# Patient Record
Sex: Male | Born: 1955
Health system: Southern US, Community
[De-identification: ages and names within clinical notes are randomized; demographics above are authoritative.]

## PROBLEM LIST (undated history)

## (undated) DIAGNOSIS — C61 Malignant neoplasm of prostate: Secondary | ICD-10-CM

## (undated) DIAGNOSIS — F1911 Other psychoactive substance abuse, in remission: Secondary | ICD-10-CM

## (undated) DIAGNOSIS — C19 Malignant neoplasm of rectosigmoid junction: Secondary | ICD-10-CM

## (undated) DIAGNOSIS — M1990 Unspecified osteoarthritis, unspecified site: Secondary | ICD-10-CM

## (undated) DIAGNOSIS — K759 Inflammatory liver disease, unspecified: Secondary | ICD-10-CM

## (undated) DIAGNOSIS — K219 Gastro-esophageal reflux disease without esophagitis: Secondary | ICD-10-CM

## (undated) DIAGNOSIS — R569 Unspecified convulsions: Secondary | ICD-10-CM

## (undated) DIAGNOSIS — F1010 Alcohol abuse, uncomplicated: Secondary | ICD-10-CM

## (undated) DIAGNOSIS — G629 Polyneuropathy, unspecified: Secondary | ICD-10-CM

## (undated) DIAGNOSIS — Z720 Tobacco use: Secondary | ICD-10-CM

## (undated) DIAGNOSIS — M25519 Pain in unspecified shoulder: Secondary | ICD-10-CM

## (undated) HISTORY — DX: Unspecified osteoarthritis, unspecified site: M19.90

## (undated) HISTORY — DX: Other psychoactive substance abuse, in remission: F19.11

## (undated) HISTORY — PX: COLON SURGERY: SHX602

## (undated) HISTORY — DX: Gastro-esophageal reflux disease without esophagitis: K21.9

## (undated) HISTORY — PX: PROSTATE BIOPSY: SHX241

## (undated) HISTORY — DX: Malignant neoplasm of rectosigmoid junction: C19

## (undated) HISTORY — PX: OTHER SURGICAL HISTORY: SHX169

## (undated) HISTORY — DX: Pain in unspecified shoulder: M25.519

---

## 2005-12-14 ENCOUNTER — Emergency Department: Payer: Self-pay | Admitting: General Practice

## 2009-03-12 ENCOUNTER — Encounter (INDEPENDENT_AMBULATORY_CARE_PROVIDER_SITE_OTHER): Payer: Self-pay | Admitting: *Deleted

## 2009-04-22 ENCOUNTER — Ambulatory Visit: Payer: Self-pay | Admitting: Gastroenterology

## 2009-04-22 DIAGNOSIS — B1710 Acute hepatitis C without hepatic coma: Secondary | ICD-10-CM | POA: Insufficient documentation

## 2009-04-23 LAB — CONVERTED CEMR LAB
INR: 1.1 (ref 0.0–1.5)
Prothrombin Time: 14.4 s (ref 11.6–15.2)

## 2009-04-26 DIAGNOSIS — K279 Peptic ulcer, site unspecified, unspecified as acute or chronic, without hemorrhage or perforation: Secondary | ICD-10-CM | POA: Insufficient documentation

## 2009-04-26 DIAGNOSIS — F1010 Alcohol abuse, uncomplicated: Secondary | ICD-10-CM | POA: Insufficient documentation

## 2009-04-27 LAB — CONVERTED CEMR LAB: HCV Quantitative: 7760000 intl units/mL — ABNORMAL HIGH (ref <43)

## 2009-04-28 ENCOUNTER — Ambulatory Visit (HOSPITAL_COMMUNITY): Admission: RE | Admit: 2009-04-28 | Discharge: 2009-04-28 | Payer: Self-pay | Admitting: Gastroenterology

## 2009-05-04 ENCOUNTER — Ambulatory Visit: Payer: Self-pay | Admitting: Gastroenterology

## 2009-05-04 ENCOUNTER — Encounter: Payer: Self-pay | Admitting: Gastroenterology

## 2009-05-04 ENCOUNTER — Ambulatory Visit (HOSPITAL_COMMUNITY): Admission: RE | Admit: 2009-05-04 | Discharge: 2009-05-04 | Payer: Self-pay | Admitting: Gastroenterology

## 2009-09-07 ENCOUNTER — Encounter (INDEPENDENT_AMBULATORY_CARE_PROVIDER_SITE_OTHER): Payer: Self-pay | Admitting: *Deleted

## 2009-10-07 ENCOUNTER — Ambulatory Visit: Payer: Self-pay | Admitting: Gastroenterology

## 2010-09-20 ENCOUNTER — Encounter (INDEPENDENT_AMBULATORY_CARE_PROVIDER_SITE_OTHER): Payer: Self-pay | Admitting: *Deleted

## 2010-10-11 NOTE — Assessment & Plan Note (Signed)
Summary: HVC/ETOH USE   Visit Type:  Follow-up Visit Primary Care Provider:  Casilda Carls, M.D.  Chief Complaint:  follow up- doing good.  History of Present Illness: No swelling in belly or pain. Bowels good. Rare 1x/month. Not overdoing it cause don't have enough money.  Current Medications (verified): 1)  Zantac 150 Mg Tabs (Ranitidine Hcl) .... Once Daily 2)  Meloxicam 7.5 Mg Tabs (Meloxicam) .... Once Daily  Allergies (verified): No Known Drug Allergies  Past History:  Past Medical History: Last updated: 04/22/2009 HCV AB POS MAY 2010      MAY 2010: PROT 7.8, ALB 4.5, TBILI 0.7 ALK PHOS 128 AST 26 ALT 21 HBsAg NEG CR 1.19 ETOH use PUD remote  Social History: In a relationship, one sex partner who has HCV. Alcohol Use - yes:1-2x a month when he gets his check Patient currently smokes: 1/2 pk/day Used to be a Programmer, multimedia.  Review of Systems       AUG 2010: ABD US-NL LIVER  Vital Signs:  Patient profile:   55 year old male Height:      69 inches Weight:      147 pounds BMI:     21.79 Temp:     97.9 degrees F oral Pulse rate:   72 / minute BP sitting:   110 / 78  (right arm) Cuff size:   regular  Vitals Entered By: Hendricks Limes LPN (October 07, 2009 2:20 PM)  Physical Exam  General:  Well developed, well nourished, no acute distress. Head:  Normocephalic and atraumatic. Lungs:  Clear throughout to auscultation. Heart:  Regular rate and rhythm; no murmurs Abdomen:  Soft, nontender and nondistended. Normal bowel sounds.  Impression & Recommendations:  Problem # 1:  HEPATITIS C (ICD-070.51) Assessment Unchanged  Still consuming Etoh, but less than prior years. Explained no Etoh would be best. Pt does not have cirrhoisis. OPV in 12 mos. Recheck HFP/CBC/PT/INR after next visit.  CC: PCP  Orders: Est. Patient Level II (27253)

## 2010-10-13 NOTE — Letter (Signed)
Summary: Recall Office Visit  [facility]  [address]   [place], [place] [address]   Phone: [phone]  Fax: [phone]      September 20, 2010   Zachary Gilmore [address] [place], [place]  [address] 1956-01-02   Dear Zachary Gilmore,   According to our records, it is time for you to schedule a follow-up office visit with [place].   At your convenience, please call [phone] to schedule an office visit. If you have any questions, concerns, or feel that this letter is in error, we would appreciate your call.   Sincerely,    Diana Eves  [facility] Ph: [phone]   Fax: [phone]

## 2010-12-17 LAB — BASIC METABOLIC PANEL
BUN: 12 mg/dL (ref 6–23)
CO2: 31 mEq/L (ref 19–32)
Calcium: 9.8 mg/dL (ref 8.4–10.5)
Chloride: 103 mEq/L (ref 96–112)
Creatinine, Ser: 0.84 mg/dL (ref 0.4–1.5)
GFR calc Af Amer: >60 mL/min (ref >60)
GFR calc non Af Amer: >60 mL/min (ref >60)
Glucose, Bld: 92 mg/dL (ref 70–99)
Potassium: 4.9 mEq/L (ref 3.5–5.1)
Sodium: 139 mEq/L (ref 135–145)

## 2010-12-17 LAB — HEMOGLOBIN AND HEMATOCRIT, BLOOD
HCT: 45 % (ref 39.0–52.0)
Hemoglobin: 15.8 g/dL (ref 13.0–17.0)

## 2011-01-24 NOTE — Op Note (Signed)
Zachary Gilmore, Zachary Gilmore               ACCOUNT NO.:  [id number]   MEDICAL RECORD NO.:  [id number]          PATIENT TYPE:  AMB   LOCATION:  DAY                           FACILITY:  [facility]   PHYSICIAN:  Zachary Gilmore, M.D.      DATE OF BIRTH:  [birth year]   DATE OF PROCEDURE:  DATE OF DISCHARGE:  05/04/2009                               OPERATIVE REPORT   REFERRING Zachary Gilmore:  Zachary Carls, MD   PROCEDURES:  Colonoscopy with cold forceps, and snare cautery  polypectomy.   INDICATION FOR EXAMINATION:  Zachary Gilmore is a 55 year old male,  reportedly his father had colon cancer on the age 71.  He presents for  colon cancer screening.   FINDINGS:  1. Two 6-mm sessile ascending colon polyp removed via snare cautery.      One 4-mm sessile rectal polyp removed via cold forceps.  Otherwise      no masses, inflammatory changes, diverticular AVMs.  2. Normal retroflexed view of the rectum.   RECOMMENDATIONS:  1. Screening colonoscopy in 5 years.  2. Follow a high fiber diet. He was given a handout on high-fiber diet      and polyps.  3. No aspirin, NSAIDs, or anticoagulation for 7 days.  4. Will call him with the results of his biopsies.   MEDICATIONS:  MAC provided by Anesthesia.   PROCEDURE TECHNIQUE:  Physical exam was performed.  Informed consent was  obtained from the patient after explaining the benefits, risks, and  alternatives to the procedure.  The patient was connected to the monitor  and placed in left lateral position.  Continuous oxygen was provided by  nasal cannula.  IV medicine administered through an indwelling cannula.  After administration of sedation and rectal exam, the patient's rectum  was then intubated and the scope was advanced under direct visualization  to the cecum.  Scope was removed slowly by  carefully examining the color, texture, anatomy, and integrity of mucosa  on the way out.  The patient was recovered in endoscopy discharged home  in satisfactory condition.   PATH:  SIMPLE ADENOMA. TCS IN 5 YEARS DUE TO FAMILY HISTORY.      Zachary Gilmore, M.D.  Electronically Signed     SM/MEDQ  D:  05/04/2009  T:  05/05/2009  Job:  [id number]   cc:   Zachary Carls, MD

## 2012-06-06 ENCOUNTER — Ambulatory Visit: Payer: Self-pay | Admitting: Family Medicine

## 2014-04-20 ENCOUNTER — Encounter: Payer: Self-pay | Admitting: Gastroenterology

## 2016-05-24 DIAGNOSIS — M158 Other polyosteoarthritis: Secondary | ICD-10-CM

## 2016-05-24 DIAGNOSIS — F1911 Other psychoactive substance abuse, in remission: Secondary | ICD-10-CM

## 2016-05-24 DIAGNOSIS — M25519 Pain in unspecified shoulder: Secondary | ICD-10-CM

## 2016-05-24 DIAGNOSIS — K219 Gastro-esophageal reflux disease without esophagitis: Secondary | ICD-10-CM | POA: Insufficient documentation

## 2016-05-24 DIAGNOSIS — C19 Malignant neoplasm of rectosigmoid junction: Secondary | ICD-10-CM

## 2016-05-24 DIAGNOSIS — M1990 Unspecified osteoarthritis, unspecified site: Secondary | ICD-10-CM | POA: Insufficient documentation

## 2016-05-25 ENCOUNTER — Encounter: Payer: Self-pay | Admitting: Gastroenterology

## 2016-05-25 ENCOUNTER — Ambulatory Visit (INDEPENDENT_AMBULATORY_CARE_PROVIDER_SITE_OTHER): Payer: Medicare HMO | Admitting: Gastroenterology

## 2016-05-25 ENCOUNTER — Other Ambulatory Visit: Payer: Self-pay

## 2016-05-25 VITALS — BP 134/76 | HR 61 | Temp 98.4°F | Ht 69.0 in | Wt 129.0 lb

## 2016-05-25 DIAGNOSIS — B182 Chronic viral hepatitis C: Secondary | ICD-10-CM | POA: Diagnosis not present

## 2016-05-25 DIAGNOSIS — B1920 Unspecified viral hepatitis C without hepatic coma: Secondary | ICD-10-CM
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
## 2016-05-25 NOTE — Patient Instructions (Signed)
Pt is scheduled for a RUQ abdominal US/tissue elastography at [facility] on Tuesday, Sept 19th @ 9:30am. Pt is to arrive at the [facility] @ 9:15am and check in at the registration desk. Pt is NOT to have anything to eat or drink after midnight Monday night. If you need to reschedule for any reason, please call central scheduling at [phone].

## 2016-05-30 ENCOUNTER — Ambulatory Visit
Admission: RE | Admit: 2016-05-30 | Discharge: 2016-05-30 | Disposition: A | Discharge status: Home / Self Care | Payer: Medicare HMO | Source: Ambulatory Visit | Attending: Gastroenterology | Admitting: Gastroenterology

## 2016-05-30 DIAGNOSIS — B1920 Unspecified viral hepatitis C without hepatic coma: Secondary | ICD-10-CM

## 2016-06-01 ENCOUNTER — Ambulatory Visit
Admission: RE | Admit: 2016-06-01 | Discharge: 2016-06-01 | Disposition: A | Discharge status: Home / Self Care | Payer: Medicare HMO | Source: Ambulatory Visit | Attending: Gastroenterology | Admitting: Gastroenterology

## 2016-06-01 DIAGNOSIS — B1920 Unspecified viral hepatitis C without hepatic coma: Secondary | ICD-10-CM | POA: Diagnosis present

## 2016-06-02 ENCOUNTER — Telehealth: Payer: Self-pay

## 2016-06-02 NOTE — Telephone Encounter (Signed)
Group home has been advised of [place] results. Still waiting on lab results.

## 2016-06-02 NOTE — Telephone Encounter (Signed)
-----   Message from Lucilla Lame, MD sent at 06/01/2016 12:41 PM EDT ----- Let the patient know that his blood work is still pending but his ultrasound showed him to have fibrosis score of 2 and 3 without any sign of cirrhosis.

## 2016-06-03 LAB — CBC WITH DIFFERENTIAL/PLATELET
Basophils Absolute: 0.1 10*3/uL (ref 0.0–0.2)
Basos: 1 %
EOS (ABSOLUTE): 0.2 10*3/uL (ref 0.0–0.4)
Eos: 2 %
Hematocrit: 50.5 % (ref 37.5–51.0)
Hemoglobin: 16.8 g/dL (ref 12.6–17.7)
Immature Grans (Abs): 0 10*3/uL (ref 0.0–0.1)
Immature Granulocytes: 0 %
Lymphocytes Absolute: 2.1 10*3/uL (ref 0.7–3.1)
Lymphs: 26 %
MCH: 31.1 pg (ref 26.6–33.0)
MCHC: 33.3 g/dL (ref 31.5–35.7)
MCV: 94 fL (ref 79–97)
Monocytes Absolute: 0.8 10*3/uL (ref 0.1–0.9)
Monocytes: 9 %
Neutrophils Absolute: 5 10*3/uL (ref 1.4–7.0)
Neutrophils: 62 %
Platelets: 183 10*3/uL (ref 150–379)
RBC: 5.4 x10E6/uL (ref 4.14–5.80)
RDW: 15.5 % — ABNORMAL HIGH (ref 12.3–15.4)
WBC: 8 10*3/uL (ref 3.4–10.8)

## 2016-06-03 LAB — HEPATIC FUNCTION PANEL
ALT: 13 IU/L (ref 0–44)
AST: 22 IU/L (ref 0–40)
Albumin: 4.2 g/dL (ref 3.6–4.8)
Alkaline Phosphatase: 106 IU/L (ref 39–117)
Bilirubin Total: 0.5 mg/dL (ref 0.0–1.2)
Bilirubin, Direct: 0.13 mg/dL (ref 0.00–0.40)
Total Protein: 7.1 g/dL (ref 6.0–8.5)

## 2016-06-03 LAB — HCV RT-PCR, QUANT (NON-GRAPH)

## 2016-06-03 LAB — HEPATITIS C GENOTYPE

## 2016-06-03 LAB — HEPATITIS A ANTIBODY, TOTAL: Hep A Total Ab: NEGATIVE

## 2016-06-03 LAB — HEPATITIS B SURFACE ANTIBODY,QUALITATIVE: Hep B Surface Ab, Qual: NONREACTIVE

## 2016-06-03 LAB — ANA: Anti Nuclear Antibody(ANA): NEGATIVE

## 2016-06-03 LAB — HEPATITIS C ANTIBODY: Hep C Virus Ab: >11 s/co ratio — ABNORMAL HIGH (ref 0.0–0.9)

## 2016-06-03 LAB — MITOCHONDRIAL ANTIBODIES: Mitochondrial Ab: 4.8 Units (ref 0.0–20.0)

## 2016-06-03 LAB — ANTI-SMOOTH MUSCLE ANTIBODY, IGG: Smooth Muscle Ab: 22 Units — ABNORMAL HIGH (ref 0–19)

## 2016-06-03 LAB — ALPHA-1-ANTITRYPSIN: A-1 Antitrypsin: 136 mg/dL (ref 90–200)

## 2016-06-03 LAB — IGG, IGA, IGM
IgA/Immunoglobulin A, Serum: 249 mg/dL (ref 61–437)
IgG (Immunoglobin G), Serum: 1296 mg/dL (ref 700–1600)
IgM (Immunoglobulin M), Srm: 123 mg/dL (ref 20–172)

## 2016-06-03 LAB — HEPATITIS B SURFACE ANTIGEN: Hepatitis B Surface Ag: NEGATIVE

## 2016-06-03 LAB — CERULOPLASMIN: Ceruloplasmin: 27.1 mg/dL (ref 16.0–31.0)

## 2016-06-06 ENCOUNTER — Telehealth: Payer: Self-pay

## 2016-06-06 NOTE — Telephone Encounter (Signed)
-----   Message from Lucilla Lame, MD sent at 06/06/2016  7:58 AM EDT ----- This patient has a positive viral load with genotype 1A and needs to be started on treatment when all of his labs and scans are back.

## 2016-06-06 NOTE — Telephone Encounter (Signed)
Pt's caretaker has been notified of results. Paperwork has been completed and faxed to [employer]. Waiting on approval.

## 2016-08-30 DIAGNOSIS — M75121 Complete rotator cuff tear or rupture of right shoulder, not specified as traumatic: Secondary | ICD-10-CM | POA: Insufficient documentation

## 2016-08-30 DIAGNOSIS — M7581 Other shoulder lesions, right shoulder: Secondary | ICD-10-CM | POA: Insufficient documentation

## 2016-09-26 ENCOUNTER — Encounter
Admission: RE | Admit: 2016-09-26 | Discharge: 2016-09-26 | Disposition: A | Discharge status: Home / Self Care | Payer: Medicare HMO | Source: Ambulatory Visit | Attending: Surgery | Admitting: Surgery

## 2016-09-26 DIAGNOSIS — Z01812 Encounter for preprocedural laboratory examination: Secondary | ICD-10-CM | POA: Insufficient documentation

## 2016-09-26 DIAGNOSIS — M75121 Complete rotator cuff tear or rupture of right shoulder, not specified as traumatic: Secondary | ICD-10-CM | POA: Diagnosis not present

## 2016-09-26 HISTORY — DX: Unspecified convulsions: R56.9

## 2016-09-26 HISTORY — DX: Alcohol abuse, uncomplicated: F10.10

## 2016-09-26 HISTORY — DX: Polyneuropathy, unspecified: G62.9

## 2016-09-26 HISTORY — DX: Inflammatory liver disease, unspecified: K75.9

## 2016-09-26 HISTORY — DX: Tobacco use: Z72.0

## 2016-09-26 LAB — BASIC METABOLIC PANEL
Anion gap: 5 (ref 5–15)
BUN: 8 mg/dL (ref 6–20)
CO2: 33 mmol/L — ABNORMAL HIGH (ref 22–32)
Calcium: 9.5 mg/dL (ref 8.9–10.3)
Chloride: 100 mmol/L — ABNORMAL LOW (ref 101–111)
Creatinine, Ser: 0.94 mg/dL (ref 0.61–1.24)
GFR calc Af Amer: >60 mL/min (ref >60)
GFR calc non Af Amer: >60 mL/min (ref >60)
Glucose, Bld: 92 mg/dL (ref 65–99)
Potassium: 4.1 mmol/L (ref 3.5–5.1)
Sodium: 138 mmol/L (ref 135–145)

## 2016-09-26 LAB — DIFFERENTIAL
Basophils Absolute: 0.1 10*3/uL (ref 0–0.1)
Basophils Relative: 1 %
Eosinophils Absolute: 0.3 10*3/uL (ref 0–0.7)
Eosinophils Relative: 3 %
Lymphocytes Relative: 19 %
Lymphs Abs: 1.9 10*3/uL (ref 1.0–3.6)
Monocytes Absolute: 0.8 10*3/uL (ref 0.2–1.0)
Monocytes Relative: 9 %
Neutro Abs: 6.8 10*3/uL — ABNORMAL HIGH (ref 1.4–6.5)
Neutrophils Relative %: 68 %

## 2016-09-26 LAB — CBC
HCT: 48.9 % (ref 40.0–52.0)
Hemoglobin: 16.9 g/dL (ref 13.0–18.0)
MCH: 31.7 pg (ref 26.0–34.0)
MCHC: 34.5 g/dL (ref 32.0–36.0)
MCV: 91.9 fL (ref 80.0–100.0)
Platelets: 263 10*3/uL (ref 150–440)
RBC: 5.32 MIL/uL (ref 4.40–5.90)
RDW: 14 % (ref 11.5–14.5)
WBC: 9.9 10*3/uL (ref 3.8–10.6)

## 2016-09-26 NOTE — Patient Instructions (Signed)
  Your procedure is scheduled on: October 03, 2016 (Tuesday)  Report to Same Day Surgery 2nd floor medical mall [facility] Entrance-take elevator on left to 2nd floor.  Check in with surgery information desk.) To find out your arrival time please call [phone] between 1PM - 3PM on October 02, 2016 (Monday)  Remember: Instructions that are not followed completely may result in serious medical risk, up to and including death, or upon the discretion of your surgeon and anesthesiologist your surgery may need to be rescheduled.    _x___ 1. Do not eat food or drink liquids after midnight. No gum chewing or hard candies.     __x__ 2. No Alcohol for 24 hours before or after surgery.   __x__3. No Smoking for 24 prior to surgery.   ____  4. Bring all medications with you on the day of surgery if instructed.    __x__ 5. Notify your doctor if there is any change in your medical condition     (cold, fever, infections).     Do not wear jewelry, make-up, hairpins, clips or nail polish.  Do not wear lotions, powders, or perfumes. You may wear deodorant.  Do not shave 48 hours prior to surgery. Men may shave face and neck.  Do not bring valuables to the hospital.    [facility] is not responsible for any belongings or valuables.               Contacts, dentures or bridgework may not be worn into surgery.  Leave your suitcase in the car. After surgery it may be brought to your room.  For patients admitted to the hospital, discharge time is determined by your treatment team.   Patients discharged the day of surgery will not be allowed to drive home.  You will need someone to drive you home and stay with you the night of your procedure.    Please read over the following fact sheets that you were given:   [facility] Preparing for Surgery and or MRSA Information   _x___ Take these medicines the morning of surgery with A SIP OF WATER:    1. Zantac  (Ranitidine)  2.  3.  4.  5.  6.  ____Fleets enema or Magnesium Citrate as directed.   _x___ Use CHG Soap or sage wipes as directed on instruction sheet   ____ Use inhalers on the day of surgery and bring to hospital day of surgery  ____ Stop metformin 2 days prior to surgery    ____ Take 1/2 of usual insulin dose the night before surgery and none on the morning of           surgery.   _x__ Stop Aspirin, Coumadin, Pllavix ,Eliquis, Effient, or Pradaxa (NO ASPIRIN)  x__ Stop Anti-inflammatories such as Advil, Aleve, Ibuprofen, Motrin, Naproxen,          Naprosyn, Goodies powders or aspirin products. Ok to take Tylenol.   ____ Stop supplements until after surgery.    ____ Bring C-Pap to the hospital.

## 2016-10-02 MED ORDER — CEFAZOLIN SODIUM-DEXTROSE 2-4 GM/100ML-% IV SOLN
2.0000 g | Freq: Once | INTRAVENOUS | Status: AC
  Administered 2016-10-03: 2 g via INTRAVENOUS

## 2016-10-03 ENCOUNTER — Ambulatory Visit
Admission: RE | Admit: 2016-10-03 | Discharge: 2016-10-03 | Disposition: A | Discharge status: Home / Self Care | Payer: Medicare HMO | Source: Ambulatory Visit | Attending: Surgery | Admitting: Surgery

## 2016-10-03 ENCOUNTER — Ambulatory Visit: Payer: Medicare HMO | Admitting: Certified Registered Nurse Anesthetist

## 2016-10-03 ENCOUNTER — Encounter
Admission: RE | Disposition: A | Discharge status: Home / Self Care | Payer: Self-pay | Source: Ambulatory Visit | Attending: Surgery

## 2016-10-03 ENCOUNTER — Encounter: Payer: Self-pay | Admitting: *Deleted

## 2016-10-03 DIAGNOSIS — M19011 Primary osteoarthritis, right shoulder: Secondary | ICD-10-CM | POA: Diagnosis not present

## 2016-10-03 DIAGNOSIS — I208 Other forms of angina pectoris: Secondary | ICD-10-CM | POA: Diagnosis present

## 2016-10-03 DIAGNOSIS — M7521 Bicipital tendinitis, right shoulder: Secondary | ICD-10-CM | POA: Diagnosis not present

## 2016-10-03 DIAGNOSIS — I2089 Other forms of angina pectoris: Secondary | ICD-10-CM | POA: Diagnosis present

## 2016-10-03 DIAGNOSIS — R9431 Abnormal electrocardiogram [ECG] [EKG]: Secondary | ICD-10-CM | POA: Insufficient documentation

## 2016-10-03 DIAGNOSIS — E782 Mixed hyperlipidemia: Secondary | ICD-10-CM | POA: Diagnosis not present

## 2016-10-03 DIAGNOSIS — I10 Essential (primary) hypertension: Secondary | ICD-10-CM | POA: Insufficient documentation

## 2016-10-03 DIAGNOSIS — Z85038 Personal history of other malignant neoplasm of large intestine: Secondary | ICD-10-CM | POA: Diagnosis not present

## 2016-10-03 DIAGNOSIS — M7541 Impingement syndrome of right shoulder: Secondary | ICD-10-CM | POA: Insufficient documentation

## 2016-10-03 DIAGNOSIS — R079 Chest pain, unspecified: Secondary | ICD-10-CM

## 2016-10-03 DIAGNOSIS — Z8249 Family history of ischemic heart disease and other diseases of the circulatory system: Secondary | ICD-10-CM | POA: Diagnosis not present

## 2016-10-03 DIAGNOSIS — F17210 Nicotine dependence, cigarettes, uncomplicated: Secondary | ICD-10-CM | POA: Diagnosis not present

## 2016-10-03 DIAGNOSIS — B1920 Unspecified viral hepatitis C without hepatic coma: Secondary | ICD-10-CM | POA: Diagnosis not present

## 2016-10-03 DIAGNOSIS — Z9889 Other specified postprocedural states: Secondary | ICD-10-CM | POA: Diagnosis not present

## 2016-10-03 DIAGNOSIS — Z79899 Other long term (current) drug therapy: Secondary | ICD-10-CM | POA: Insufficient documentation

## 2016-10-03 DIAGNOSIS — K219 Gastro-esophageal reflux disease without esophagitis: Secondary | ICD-10-CM | POA: Diagnosis not present

## 2016-10-03 DIAGNOSIS — G629 Polyneuropathy, unspecified: Secondary | ICD-10-CM | POA: Insufficient documentation

## 2016-10-03 DIAGNOSIS — I2510 Atherosclerotic heart disease of native coronary artery without angina pectoris: Secondary | ICD-10-CM | POA: Diagnosis not present

## 2016-10-03 DIAGNOSIS — M65811 Other synovitis and tenosynovitis, right shoulder: Secondary | ICD-10-CM | POA: Insufficient documentation

## 2016-10-03 DIAGNOSIS — M75121 Complete rotator cuff tear or rupture of right shoulder, not specified as traumatic: Secondary | ICD-10-CM | POA: Diagnosis present

## 2016-10-03 DIAGNOSIS — Z800 Family history of malignant neoplasm of digestive organs: Secondary | ICD-10-CM | POA: Insufficient documentation

## 2016-10-03 DIAGNOSIS — Z8711 Personal history of peptic ulcer disease: Secondary | ICD-10-CM | POA: Insufficient documentation

## 2016-10-03 HISTORY — PX: SHOULDER ARTHROSCOPY WITH BICEPS TENDON REPAIR: SHX5674

## 2016-10-03 HISTORY — PX: CARDIAC CATHETERIZATION: SHX172

## 2016-10-03 LAB — TROPONIN I: Troponin I: <0.03 ng/mL (ref <0.03)

## 2016-10-03 LAB — CKMB (ARMC ONLY): CK, MB: 1.8 ng/mL (ref 0.5–5.0)

## 2016-10-03 SURGERY — SHOULDER ARTHROSCOPY WITH BICEPS TENDON REPAIR
Anesthesia: Regional | Site: Shoulder | Laterality: Right | Wound class: Clean

## 2016-10-03 SURGERY — LEFT HEART CATH
Anesthesia: Moderate Sedation

## 2016-10-03 MED ORDER — MIDAZOLAM HCL 2 MG/2ML IJ SOLN
INTRAMUSCULAR | Status: AC
Start: 2016-10-03 — End: 2016-10-03
  Filled 2016-10-03: qty 2

## 2016-10-03 MED ORDER — SODIUM CHLORIDE 0.9 % WEIGHT BASED INFUSION: 3.0000 mL/kg/h | INTRAVENOUS | Status: DC

## 2016-10-03 MED ORDER — FENTANYL CITRATE (PF) 100 MCG/2ML IJ SOLN
50.0000 ug | Freq: Once | INTRAMUSCULAR | Status: AC
  Administered 2016-10-03: 50 ug via INTRAVENOUS

## 2016-10-03 MED ORDER — LIDOCAINE HCL (PF) 1 % IJ SOLN
INTRAMUSCULAR | Status: DC | PRN
  Administered 2016-10-03: 5 mL via SUBCUTANEOUS

## 2016-10-03 MED ORDER — ASPIRIN EC 325 MG PO TBEC
DELAYED_RELEASE_TABLET | ORAL | Status: AC
  Administered 2016-10-03: 325 mg
  Filled 2016-10-03: qty 1

## 2016-10-03 MED ORDER — ASPIRIN 325 MG PO TABS
325.0000 mg | ORAL_TABLET | Freq: Once | ORAL | Status: DC
  Filled 2016-10-03: qty 1

## 2016-10-03 MED ORDER — SODIUM CHLORIDE 0.9% FLUSH: 3.0000 mL | Freq: Two times a day (BID) | INTRAVENOUS | Status: DC

## 2016-10-03 MED ORDER — LACTATED RINGERS IV SOLN
INTRAVENOUS | Status: DC | PRN
  Administered 2016-10-03: 11:00:00 via INTRAVENOUS

## 2016-10-03 MED ORDER — PHENYLEPHRINE HCL 10 MG/ML IJ SOLN
INTRAMUSCULAR | Status: DC | PRN
  Administered 2016-10-03: 100 ug via INTRAVENOUS
  Administered 2016-10-03: 200 ug via INTRAVENOUS
  Administered 2016-10-03: 150 ug via INTRAVENOUS

## 2016-10-03 MED ORDER — GLYCOPYRROLATE 0.2 MG/ML IJ SOLN
INTRAMUSCULAR | Status: DC | PRN
  Administered 2016-10-03: 0.2 mg via INTRAVENOUS

## 2016-10-03 MED ORDER — LIDOCAINE HCL (PF) 2 % IJ SOLN
INTRAMUSCULAR | Status: AC
  Filled 2016-10-03: qty 2

## 2016-10-03 MED ORDER — MIDAZOLAM HCL 2 MG/2ML IJ SOLN
INTRAMUSCULAR | Status: DC | PRN
  Administered 2016-10-03: 1 mg via INTRAVENOUS

## 2016-10-03 MED ORDER — HYDROMORPHONE HCL 1 MG/ML IJ SOLN
0.5000 mg | Freq: Once | INTRAMUSCULAR | Status: AC
  Administered 2016-10-03: 0.5 mg via INTRAVENOUS

## 2016-10-03 MED ORDER — SUGAMMADEX SODIUM 200 MG/2ML IV SOLN
INTRAVENOUS | Status: DC | PRN
  Administered 2016-10-03: 150 mg via INTRAVENOUS
  Administered 2016-10-03: 50 mg via INTRAVENOUS

## 2016-10-03 MED ORDER — OXYCODONE HCL 5 MG PO TABS: 5.0000 mg | ORAL_TABLET | ORAL | 0 refills | Status: DC | PRN

## 2016-10-03 MED ORDER — ONDANSETRON HCL 4 MG/2ML IJ SOLN
INTRAMUSCULAR | Status: AC
  Filled 2016-10-03: qty 2

## 2016-10-03 MED ORDER — SODIUM CHLORIDE 0.9 % WEIGHT BASED INFUSION: 1.0000 mL/kg/h | INTRAVENOUS | Status: DC

## 2016-10-03 MED ORDER — MIDAZOLAM HCL 2 MG/2ML IJ SOLN
1.0000 mg | Freq: Once | INTRAMUSCULAR | Status: AC
  Administered 2016-10-03: 1 mg via INTRAVENOUS

## 2016-10-03 MED ORDER — MIDAZOLAM HCL 2 MG/2ML IJ SOLN
INTRAMUSCULAR | Status: AC
  Administered 2016-10-03: 1 mg via INTRAVENOUS
  Filled 2016-10-03: qty 2

## 2016-10-03 MED ORDER — SODIUM CHLORIDE 0.9 % IV SOLN: 250.0000 mL | INTRAVENOUS | Status: DC | PRN

## 2016-10-03 MED ORDER — ROCURONIUM BROMIDE 100 MG/10ML IV SOLN
INTRAVENOUS | Status: DC | PRN
  Administered 2016-10-03: 20 mg via INTRAVENOUS
  Administered 2016-10-03: 30 mg via INTRAVENOUS

## 2016-10-03 MED ORDER — LIDOCAINE HCL (CARDIAC) 20 MG/ML IV SOLN
INTRAVENOUS | Status: DC | PRN
  Administered 2016-10-03: 100 mg via INTRAVENOUS

## 2016-10-03 MED ORDER — FENTANYL CITRATE (PF) 100 MCG/2ML IJ SOLN
INTRAMUSCULAR | Status: AC
  Administered 2016-10-03: 50 ug via INTRAVENOUS
  Filled 2016-10-03: qty 2

## 2016-10-03 MED ORDER — EPINEPHRINE PF 1 MG/ML IJ SOLN
INTRAMUSCULAR | Status: AC
  Filled 2016-10-03: qty 2

## 2016-10-03 MED ORDER — LIDOCAINE HCL (PF) 1 % IJ SOLN
INTRAMUSCULAR | Status: AC
  Filled 2016-10-03: qty 5

## 2016-10-03 MED ORDER — FENTANYL CITRATE (PF) 100 MCG/2ML IJ SOLN
INTRAMUSCULAR | Status: AC
  Filled 2016-10-03: qty 2

## 2016-10-03 MED ORDER — SUGAMMADEX SODIUM 200 MG/2ML IV SOLN
INTRAVENOUS | Status: AC
  Filled 2016-10-03: qty 2

## 2016-10-03 MED ORDER — FENTANYL CITRATE (PF) 100 MCG/2ML IJ SOLN
INTRAMUSCULAR | Status: DC | PRN
  Administered 2016-10-03: 25 ug via INTRAVENOUS

## 2016-10-03 MED ORDER — BUPIVACAINE-EPINEPHRINE (PF) 0.25% -1:200000 IJ SOLN
INTRAMUSCULAR | Status: DC | PRN
  Administered 2016-10-03: 30 mL via PERINEURAL

## 2016-10-03 MED ORDER — LACTATED RINGERS IV SOLN
INTRAVENOUS | Status: DC
  Administered 2016-10-03: 1000 mL via INTRAVENOUS
  Administered 2016-10-03: 10:00:00 via INTRAVENOUS

## 2016-10-03 MED ORDER — EPINEPHRINE PF 1 MG/ML IJ SOLN
INTRAMUSCULAR | Status: DC | PRN
  Administered 2016-10-03: 2 mL

## 2016-10-03 MED ORDER — PROPOFOL 10 MG/ML IV BOLUS
INTRAVENOUS | Status: AC
  Filled 2016-10-03: qty 20

## 2016-10-03 MED ORDER — BUPIVACAINE-EPINEPHRINE (PF) 0.25% -1:200000 IJ SOLN
INTRAMUSCULAR | Status: AC
  Filled 2016-10-03: qty 30

## 2016-10-03 MED ORDER — LABETALOL HCL 5 MG/ML IV SOLN
INTRAVENOUS | Status: DC | PRN
  Administered 2016-10-03: 10 mg via INTRAVENOUS

## 2016-10-03 MED ORDER — ROPIVACAINE HCL 5 MG/ML IJ SOLN
INTRAMUSCULAR | Status: DC | PRN
  Administered 2016-10-03: 30 mL via PERINEURAL

## 2016-10-03 MED ORDER — ROPIVACAINE HCL 5 MG/ML IJ SOLN
INTRAMUSCULAR | Status: AC
Start: 2016-10-03 — End: 2016-10-03
  Filled 2016-10-03: qty 40

## 2016-10-03 MED ORDER — SODIUM CHLORIDE 0.9% FLUSH: 3.0000 mL | INTRAVENOUS | Status: DC | PRN

## 2016-10-03 MED ORDER — FENTANYL CITRATE (PF) 100 MCG/2ML IJ SOLN: 25.0000 ug | INTRAMUSCULAR | Status: DC | PRN

## 2016-10-03 MED ORDER — PROMETHAZINE HCL 25 MG/ML IJ SOLN: 6.2500 mg | INTRAMUSCULAR | Status: DC | PRN

## 2016-10-03 MED ORDER — IOPAMIDOL (ISOVUE-300) INJECTION 61%
INTRAVENOUS | Status: DC | PRN
  Administered 2016-10-03: 130 mL via INTRA_ARTERIAL

## 2016-10-03 MED ORDER — NITROGLYCERIN IN D5W 200-5 MCG/ML-% IV SOLN
INTRAVENOUS | Status: AC
  Filled 2016-10-03: qty 250

## 2016-10-03 MED ORDER — EPHEDRINE SULFATE 50 MG/ML IJ SOLN
INTRAMUSCULAR | Status: DC | PRN
  Administered 2016-10-03 (×3): 10 mg via INTRAVENOUS
  Administered 2016-10-03: 5 mg via INTRAVENOUS

## 2016-10-03 MED ORDER — DEXAMETHASONE SODIUM PHOSPHATE 10 MG/ML IJ SOLN
INTRAMUSCULAR | Status: AC
  Filled 2016-10-03: qty 1

## 2016-10-03 MED ORDER — NITROGLYCERIN 0.2 MG/ML ON CALL CATH LAB
INTRAVENOUS | Status: DC | PRN
  Administered 2016-10-03: 40 ug via INTRAVENOUS

## 2016-10-03 MED ORDER — ASPIRIN 81 MG PO CHEW
81.0000 mg | CHEWABLE_TABLET | ORAL | Status: DC
  Filled 2016-10-03: qty 1

## 2016-10-03 MED ORDER — PROPOFOL 10 MG/ML IV BOLUS
INTRAVENOUS | Status: DC | PRN
  Administered 2016-10-03: 200 mg via INTRAVENOUS

## 2016-10-03 MED ORDER — FENTANYL CITRATE (PF) 100 MCG/2ML IJ SOLN
INTRAMUSCULAR | Status: DC | PRN
  Administered 2016-10-03: 50 ug via INTRAVENOUS

## 2016-10-03 MED ORDER — HYDROMORPHONE HCL 1 MG/ML IJ SOLN
INTRAMUSCULAR | Status: AC
  Filled 2016-10-03: qty 0.5

## 2016-10-03 SURGICAL SUPPLY — 41 items
BIT DRILL JUGRKNT W/NDL BIT2.9 (DRILL) IMPLANT
BLADE FULL RADIUS 3.5 (BLADE) ×3 IMPLANT
BUR ACROMIONIZER 4.0 (BURR) ×3 IMPLANT
CANNULA SHAVER 8MMX76MM (CANNULA) ×3 IMPLANT
CHLORAPREP W/TINT 26ML (MISCELLANEOUS) ×3 IMPLANT
COVER MAYO STAND STRL (DRAPES) ×3 IMPLANT
DRAPE IMP U-DRAPE 54X76 (DRAPES) ×6 IMPLANT
DRILL JUGGERKNOT W/NDL BIT 2.9 (DRILL)
DRSG OPSITE POSTOP 4X8 (GAUZE/BANDAGES/DRESSINGS) IMPLANT
ELECT REM PT RETURN 9FT ADLT (ELECTROSURGICAL) ×3
ELECTRODE REM PT RTRN 9FT ADLT (ELECTROSURGICAL) ×2 IMPLANT
GAUZE PETRO XEROFOAM 1X8 (MISCELLANEOUS) ×3 IMPLANT
GAUZE SPONGE 4X4 12PLY STRL (GAUZE/BANDAGES/DRESSINGS) ×3 IMPLANT
GLOVE BIO SURGEON STRL SZ7.5 (GLOVE) ×6 IMPLANT
GLOVE BIO SURGEON STRL SZ8 (GLOVE) ×6 IMPLANT
GLOVE BIOGEL PI IND STRL 8 (GLOVE) ×2 IMPLANT
GLOVE BIOGEL PI INDICATOR 8 (GLOVE) ×1
GLOVE INDICATOR 8.0 STRL GRN (GLOVE) ×3 IMPLANT
GOWN STRL REUS W/ TWL LRG LVL3 (GOWN DISPOSABLE) ×2 IMPLANT
GOWN STRL REUS W/ TWL XL LVL3 (GOWN DISPOSABLE) ×2 IMPLANT
GOWN STRL REUS W/TWL LRG LVL3 (GOWN DISPOSABLE) ×1
GOWN STRL REUS W/TWL XL LVL3 (GOWN DISPOSABLE) ×1
GRASPER SUT 15 45D LOW PRO (SUTURE) IMPLANT
IV LACTATED RINGER IRRG 3000ML (IV SOLUTION) ×2
IV LR IRRIG 3000ML ARTHROMATIC (IV SOLUTION) ×4 IMPLANT
MANIFOLD NEPTUNE II (INSTRUMENTS) ×3 IMPLANT
MASK FACE SPIDER DISP (MASK) ×3 IMPLANT
MAT BLUE FLOOR 46X72 FLO (MISCELLANEOUS) ×3 IMPLANT
NEEDLE REVERSE CUT 1/2 CRC (NEEDLE) IMPLANT
PACK ARTHROSCOPY SHOULDER (MISCELLANEOUS) ×3 IMPLANT
SLING ARM LRG DEEP (SOFTGOODS) ×3 IMPLANT
SLING ULTRA II LG (MISCELLANEOUS) IMPLANT
STAPLER SKIN PROX 35W (STAPLE) ×3 IMPLANT
STRAP SAFETY BODY (MISCELLANEOUS) ×3 IMPLANT
SUT ETHIBOND 0 MO6 C/R (SUTURE) ×3 IMPLANT
SUT VIC AB 2-0 CT1 27 (SUTURE) ×2
SUT VIC AB 2-0 CT1 TAPERPNT 27 (SUTURE) ×4 IMPLANT
TAPE MICROFOAM 4IN (TAPE) ×3 IMPLANT
TUBING ARTHRO INFLOW-ONLY STRL (TUBING) ×3 IMPLANT
TUBING CONNECTING 10 (TUBING) ×3 IMPLANT
WAND HAND CNTRL MULTIVAC 90 (MISCELLANEOUS) ×3 IMPLANT

## 2016-10-03 SURGICAL SUPPLY — 10 items
CATH 5FR JR4 DIAGNOSTIC (CATHETERS) ×2 IMPLANT
CATH INFINITI 5FR ANG PIGTAIL (CATHETERS) ×2 IMPLANT
CATH INFINITI 5FR JL4 (CATHETERS) ×2 IMPLANT
DEVICE CLOSURE MYNXGRIP 5F (Vascular Products) ×2 IMPLANT
DEVICE SAFEGUARD 24CM (GAUZE/BANDAGES/DRESSINGS) ×2 IMPLANT
KIT MANI 3VAL PERCEP (MISCELLANEOUS) ×2 IMPLANT
NEEDLE PERC 18GX7CM (NEEDLE) ×2 IMPLANT
PACK CARDIAC CATH (CUSTOM PROCEDURE TRAY) ×2 IMPLANT
SHEATH PINNACLE 5F 10CM (SHEATH) ×2 IMPLANT
WIRE EMERALD 3MM-J .035X150CM (WIRE) ×2 IMPLANT

## 2016-10-03 NOTE — Consult Note (Signed)
[facility] Consultation Note  Patient ID: Zachary Gilmore, MRN: [id number], DOB/AGE: [birth year] 61 y.o. Admit date: 10/03/2016   Date of Consult: 10/03/2016 Primary Physician: Carlos American, FNP Primary Cardiologist:None  Chief Complaint: No chief complaint on file.  Reason for Consult: chest pain with abnormal EKG  HPI: 61 y.o. male with known significant smoking history with essential hypertension and apparent X hyperlipidemia having significant right shoulder pain needing further evaluation and treatment. The patient was being induced with anesthesia at the time when he had some hypotension and significant EKG changes consistent with ST elevation in the anterior precordial leads. The patient then had full resolution with hydration and was awakened from sleep. At that time he had a mild amount of chest pain which was fleeting and completely resolved. No other significant symptoms occurred. EKG had shown normal sinus rhythm with no evidence of ST elevation at this time and no further chest pain. This is most consistent with unstable angina and myocardial ischemia without evidence of myocardial infarction at this time  Past Medical History:  Diagnosis Date  . Alcohol abuse   . Colorectal cancer ([place])   . GERD (gastroesophageal reflux disease)   . Hepatitis   . History of substance abuse   . Neuropathy ([place])   . Osteoarthritis   . Seizures ([place])    as a child  . Shoulder pain   . Tobacco use       Surgical History:  Past Surgical History:  Procedure Laterality Date  . COLON SURGERY    . colorectal cancer     "scraped off"   . Stomach ulcers       Home Meds: Prior to Admission medications   Medication Sig Start Date End Date Taking? Authorizing Provider  acetaminophen (TYLENOL) 500 MG tablet Take 500 mg by mouth every 6 (six) hours as needed for mild pain.    Yes Historical Provider, MD  Cholecalciferol (D 5000) 5000 units capsule Take 5,000 Units by mouth  daily.   Yes Historical Provider, MD  donepezil (ARICEPT) 10 MG tablet Take 10 mg by mouth at bedtime.  08/04/16  Yes Historical Provider, MD  HARVONI 90-400 MG TABS Take 1 tablet by mouth daily.  08/28/16  Yes Historical Provider, MD  naltrexone (DEPADE) 50 MG tablet Take 50 mg by mouth daily.   Yes Historical Provider, MD  ranitidine (ZANTAC) 150 MG capsule Take 150 mg by mouth 2 (two) times daily.   Yes Historical Provider, MD  donepezil (ARICEPT) 5 MG tablet Take 5 mg by mouth at bedtime.    Historical Provider, MD  oxyCODONE (ROXICODONE) 5 MG immediate release tablet Take 1-2 tablets (5-10 mg total) by mouth every 4 (four) hours as needed for severe pain. 10/03/16   Corky Mull, MD    Inpatient Medications:  . aspirin  325 mg Oral Once   . lactated ringers 50 mL/hr at 10/03/16 0945    Allergies: No Known Allergies  Social History   Social History  . Marital status: Single    Spouse name: N/A  . Number of children: N/A  . Years of education: N/A   Occupational History  . Not on file.   Social History Main Topics  . Smoking status: Current Every Day Smoker    Packs/day: 0.50    Types: Cigarettes  . Smokeless tobacco: Never Used  . Alcohol use Yes     Comment: occasional glass of wine  . Drug use: Yes    Types: Marijuana  .  Sexual activity: Not on file   Other Topics Concern  . Not on file   Social History Narrative  . No narrative on file     Family History  Problem Relation Age of Onset  . Heart attack Mother   . Colon cancer Father      Review of Systems Positive forRight shoulder pain chest pain shortness of breath Negative for: General:  chills, fever, night sweats or weight changes.  Cardiovascular: PND orthopnea syncope dizziness  Dermatological skin lesions rashes Respiratory: Cough congestion Urologic: Frequent urination urination at night and hematuria Abdominal: negative for nausea, vomiting, diarrhea, bright red blood per rectum, melena, or  hematemesis Neurologic: negative for visual changes, and/or hearing changes  All other systems reviewed and are otherwise negative except as noted above.  Labs:  Recent Labs  10/03/16 1215  TROPONINI <0.03   Lab Results  Component Value Date   WBC 9.9 09/26/2016   HGB 16.9 09/26/2016   HCT 48.9 09/26/2016   MCV 91.9 09/26/2016   PLT 263 09/26/2016    Recent Labs Lab 09/26/16 1502  NA 138  K 4.1  CL 100*  CO2 33*  BUN 8  CREATININE 0.94  CALCIUM 9.5  GLUCOSE 92   No results found for: CHOL, HDL, LDLCALC, TRIG No results found for: DDIMER  Radiology/Studies:  No results found.  KWI:OXBDZH sinus rhythm with minimal ST elevation in the anterior precordial leads now resolved  Weights: There were no vitals filed for this visit.   Physical Exam: Blood pressure (!) 146/91, pulse 72, temperature 97.1 F (36.2 C), resp. rate 18, SpO2 100 %. There is no height or weight on file to calculate BMI. General: Well developed, well nourished, in no acute distress. Head eyes ears nose throat: Normocephalic, atraumatic, sclera non-icteric, no xanthomas, nares are without discharge. No apparent thyromegaly and/or mass  Lungs: Normal respiratory effort.  no wheezes, no rales, no rhonchi.  Heart: RRR with normal S1 S2. no murmur gallop, no rub, PMI is normal size and placement, carotid upstroke normal without bruit, jugular venous pressure is normal Abdomen: Soft, non-tender, non-distended with normoactive bowel sounds. No hepatomegaly. No rebound/guarding. No obvious abdominal masses. Abdominal aorta is normal size without bruit Extremities: No edema. no cyanosis, no clubbing, no ulcers  Peripheral : 2+ bilateral upper extremity pulses, 2+ bilateral femoral pulses, 2+ bilateral dorsal pedal pulse Neuro: Alert and oriented. No facial asymmetry. No focal deficit. Moves all extremities spontaneously. Musculoskeletal: Normal muscle tone without kyphosis Psych:  Responds to questions  appropriately with a normal affect.    Assessment: 61 year old male with essential hypertension mixed hyperlipidemia and tobacco abuse with the significant hypotension and abnormal EKG consistent with myocardial ischemia with induction with anesthesia  Plan: 1. Aspirin for further risk reduction cardiovascular event 2. Nitrates if patient has recurrent evidence of chest pain or EKG changes 3. Proceed to cardiac catheterization to assess coronary anatomy and further treatment thereof is necessary. Patient understands the risk and benefits of cardiac catheterization. This includes a possibility of death stroke heart attack infection bleeding or blood clot. He is at low risk for conscious sedation  Signed, Corey Skains M.D. [facility] 10/03/2016, 1:15 PM

## 2016-10-03 NOTE — Anesthesia Preprocedure Evaluation (Signed)
Anesthesia Evaluation  Patient identified by MRN, date of birth, ID band Patient awake    Reviewed: Allergy & Precautions, H&P , NPO status , Patient's Chart, lab work & pertinent test results, reviewed documented beta blocker date and time   History of Anesthesia Complications Negative for: history of anesthetic complications  Airway Mallampati: I  TM Distance: >3 FB Neck ROM: full    Dental  (+) Poor Dentition, Missing, Chipped, Dental Advidsory Given   Pulmonary neg shortness of breath, neg sleep apnea, neg COPD, neg recent URI, Current Smoker,    breath sounds clear to auscultation       Cardiovascular Exercise Tolerance: Good negative cardio ROS       Neuro/Psych Seizures - (as a child),  negative psych ROS   GI/Hepatic Neg liver ROS, PUD, GERD  ,(+)     substance abuse (history of)  , Hepatitis -, C  Endo/Other  negative endocrine ROS  Renal/GU negative Renal ROS  negative genitourinary   Musculoskeletal   Abdominal   Peds  Hematology negative hematology ROS (+)   Anesthesia Other Findings Past Medical History: No date: Alcohol abuse No date: Colorectal cancer ([facility]) No date: GERD (gastroesophageal reflux disease) No date: Hepatitis No date: History of substance abuse No date: Neuropathy ([place]) No date: Osteoarthritis No date: Seizures ([place])     Comment: as a child No date: Shoulder pain No date: Tobacco use   Reproductive/Obstetrics negative OB ROS                             Anesthesia Physical Anesthesia Plan  ASA: III  Anesthesia Plan: General and Regional   Post-op Pain Management: GA combined w/ Regional for post-op pain   Induction:   Airway Management Planned:   Additional Equipment:   Intra-op Plan:   Post-operative Plan:   Informed Consent: I have reviewed the patients History and Physical, chart, labs and discussed the procedure including the risks,  benefits and alternatives for the proposed anesthesia with the patient or authorized representative who has indicated his/her understanding and acceptance.   Dental Advisory Given  Plan Discussed with: Anesthesiologist, CRNA and Surgeon  Anesthesia Plan Comments:         Anesthesia Quick Evaluation

## 2016-10-03 NOTE — Anesthesia Post-op Follow-up Note (Cosign Needed)
Anesthesia QCDR form completed.        

## 2016-10-03 NOTE — Discharge Instructions (Addendum)
AMBULATORY SURGERY  DISCHARGE INSTRUCTIONS   1) The drugs that you were given will stay in your system until tomorrow so for the next 24 hours you should not:  A) Drive an automobile B) Make any legal decisions C) Drink any alcoholic beverage   2) You may resume regular meals tomorrow.  Today it is better to start with liquids and gradually work up to solid foods.  You may eat anything you prefer, but it is better to start with liquids, then soup and crackers, and gradually work up to solid foods.   3) Please notify your doctor immediately if you have any unusual bleeding, trouble breathing, redness and pain at the surgery site, drainage, fever, or pain not relieved by medication.    4) Additional Instructions:        Please contact your physician with any problems or Same Day Surgery at [phone], Monday through Friday 6 am to 4 pm, or [place] at [facility] number at [phone].Groin Insertion Instructions-If you lose feeling or develop tingling or pain in your leg or foot after the procedure, please walk around first.  If the discomfort does not improve , contact your physician and proceed to the nearest emergency room.  Loss of feeling in your leg might mean that a blockage has formed in the artery and this can be appropriately treated.  Limit your activity for the next two days after your procedure.  Avoid stooping, bending, heavy lifting or exertion as this may put pressure on the insertion site.  Resume normal activities in 48 hours.  You may shower after 24 hours but avoid excessive warm water and do not scrub the site.  Remove clear dressing in 48 hours.  If you have had a closure device inserted, do not soak in a tub bath or a hot tub for at least one week.  No driving for 48 hours after discharge.  After the procedure, check the insertion site occasionally.  If any oozing occurs or there is apparent swelling, firm pressure over the site will prevent a bruise from  forming.  You can not hurt anything by pressing directly on the site.  The pressure stops the bleeding by allowing a small clot to form.  If the bleeding continues after the pressure has been applied for more than 15 minutes, call 911 or go to the nearest emergency room.    The x-ray dye causes you to pass a considerate amount of urine.  For this reason, you will be asked to drink plenty of liquids after the procedure to prevent dehydration.  You may resume you regular diet.  Avoid caffeine products.    For pain at the site of your procedure, take non-aspirin medicines such as Tylenol.  Medications: A. Hold Metformin for 48 hours if applicable.  B. Continue taking all your present medications at home unless your doctor prescribes any changes.Keep dressing dry and intact.  May shower after dressing changed on post-op day #4 (Saturday).  Cover staples with Band-Aids after drying off. Apply ice frequently to shoulder. Take ibuprofen 800 mg TID with meals for 7-10 days, then as necessary. Take oxycodone as prescribed when needed.  May supplement with ES Tylenol if necessary. May discontinue sling after Sunday. Use sling only as necessary for comfort. Follow-up in 10-14 days or as scheduled.

## 2016-10-03 NOTE — Anesthesia Procedure Notes (Signed)
Anesthesia Regional Block:  Interscalene brachial plexus block  Pre-Anesthetic Checklist: ,, timeout performed, Correct Patient, Correct Site, Correct Laterality, Correct Procedure, Correct Position, site marked, Risks and benefits discussed,  Surgical consent,  Pre-op evaluation,  At surgeon's request and post-op pain management  Laterality: Right and Upper  Prep: chloraprep       Needles:  Injection technique: Single-shot  Needle Type: Stimiplex     Needle Length: 10cm 10 cm Needle Gauge: 22 and 22 G    Additional Needles:  Procedures: ultrasound guided (picture in chart) Interscalene brachial plexus block Narrative:  Start time: 10/03/2016 10:06 AM End time: 10/03/2016 10:13 AM Injection made incrementally with aspirations every 5 mL.  Performed by: Personally  Anesthesiologist: Martha Clan  Additional Notes: Functioning IV was confirmed and monitors were applied.  A 48m 22ga Stimuplex needle was used. Sterile prep and drape,hand hygiene and sterile gloves were used.  Negative aspiration and negative test dose prior to incremental administration of local anesthetic. The patient tolerated the procedure well.

## 2016-10-03 NOTE — OR Nursing (Signed)
Had to abort case due to cardiac issues.  Only part of procedure was done.

## 2016-10-03 NOTE — Transfer of Care (Signed)
Immediate Anesthesia Transfer of Care Note  Patient: Zachary Gilmore  Procedure(s) Performed: Procedure(s): SHOULDER ARTHROSCOPIC LIMITED DEBRIDEMENT  WITH BICEPS TENOLYSIS (Right)  Patient Location: PACU  Anesthesia Type:General  Level of Consciousness: awake, alert  and oriented  Airway & Oxygen Therapy: Patient Spontanous Breathing and Patient connected to face mask oxygen  Post-op Assessment: Report given to RN and Post -op Vital signs reviewed and stable  Post vital signs: Reviewed and stable  Last Vitals:  Vitals:   10/03/16 1045 10/03/16 1154  BP: 137/85 (!) 154/88  Pulse: (!) 48 68  Resp: 15 (!) 22  Temp:  36.2 C    Last Pain:  Vitals:   10/03/16 1154  TempSrc:   PainSc: 0-No pain         Complications: cardiovascular complications.  ST elevation intraop and case aborted.  Denies chest pain, chest tightness, shortness of breath in PACU.

## 2016-10-03 NOTE — H&P (Signed)
Subjective: Chief complaint:  Right shoulder pain.  The patient is a 62 y.o. male who presented to [facility] on 08/30/16.  The patient has had several years of shoulder pain due to a injury at work, he tried to catch a piece of heave pipe that was falling off his truck, by doing so he dislocated his shoulder, it was reduced and he did not seek further care.  He then dislocated again, and has had continued pain in the right shoulder.  US demonstrated a full-thickness tear of the supraspinatus tendon.  His pain is moderate, described as aching, stabbing and throbbing.  Pain is in the lateral arm.Denies any history of steroid injections for the right shoulder pain.  Presents today to undergo a right shoulder arthroscopy with debridement, decompression and repair of rotator cuff tear.  Patient Active Problem List   Diagnosis Date Noted  . GERD (gastroesophageal reflux disease)   . Shoulder pain   . Osteoarthritis   . History of substance abuse   . Colorectal cancer ([place])   . ALCOHOL USE 04/26/2009  . PUD 04/26/2009  . HEPATITIS C 04/22/2009   Past Medical History:  Diagnosis Date  . Alcohol abuse   . Colorectal cancer ([place])   . GERD (gastroesophageal reflux disease)   . Hepatitis   . History of substance abuse   . Neuropathy ([place])   . Osteoarthritis   . Seizures ([place])    as a child  . Shoulder pain   . Tobacco use     Past Surgical History:  Procedure Laterality Date  . COLON SURGERY    . colorectal cancer     "scraped off"   . Stomach ulcers      Prescriptions Prior to Admission  Medication Sig Dispense Refill Last Dose  . acetaminophen (TYLENOL) 500 MG tablet Take 500 mg by mouth every 6 (six) hours as needed for mild pain.    Past Week at Unknown time  . Cholecalciferol (D 5000) 5000 units capsule Take 5,000 Units by mouth daily.   Past Week at Unknown time  . donepezil (ARICEPT) 10 MG tablet Take 10 mg by mouth at bedtime.    10/02/2016 at Unknown time  .  HARVONI 90-400 MG TABS Take 1 tablet by mouth daily.    10/02/2016 at Unknown time  . naltrexone (DEPADE) 50 MG tablet Take 50 mg by mouth daily.   10/02/2016 at Unknown time  . ranitidine (ZANTAC) 150 MG capsule Take 150 mg by mouth 2 (two) times daily.   10/03/2016 at Unknown time  . donepezil (ARICEPT) 5 MG tablet Take 5 mg by mouth at bedtime.   Taking   No Known Allergies  Social History  Substance Use Topics  . Smoking status: Current Every Day Smoker    Packs/day: 0.50    Types: Cigarettes  . Smokeless tobacco: Never Used  . Alcohol use Yes     Comment: occasional glass of wine    Family History  Problem Relation Age of Onset  . Heart attack Mother   . Colon cancer Father      Review of Systems: As noted above. The patient denies any chest pain, shortness of breath, nausea, vomiting, diarrhea, constipation, belly pain, blood in his/her stool, or burning with urination.  Objective: Temp:  [96 F (35.6 C)] 96 F (35.6 C) (01/23 0928) Pulse Rate:  [57-110] 57 (01/23 1000) Resp:  [15-20] 15 (01/23 1000) BP: (134-163)/(86-98) 163/86 (01/23 1000) SpO2:  [98 %] 98 % (  01/23 1000)  Physical Exam: General:  Alert, no acute distress Psychiatric:  Patient is competent for consent with normal mood and affect Cardiovascular:  RRR  Respiratory:  Clear to auscultation. No wheezing. Non-labored breathing GI:  Abdomen is soft and non-tender Skin:  No lesions in the area of chief complaint Neurologic:  Sensation intact distally Lymphatic:  No axillary or cervical lymphadenopathy  Orthopedic Exam:  Right shoulder exam: SKIN: normal SWELLING: none WARMTH: none LYMPH NODES: no adenopathy palpable CREPITUS: none TENDERNESS: Mildly tender over the anterolateral shoulder ROM (active):  Forward flexion: 145 Abduction: 140 Internal rotation: L1 ROM (passive):  Forward flexion: 155 Abduction: 150 ER/IR at 90 abd: 90/65  He has mild pain at the extremes of all  motions.  STRENGTH: Forward flexion: 4-4+/5 Abduction: 4/5 External rotation: 4/5 Internal rotation: 4-4+/5 Pain with RC testing: Mild pain with resisted forward flexion, abduction, and external rotation.  STABILITY: Normal  SPECIAL TESTS: Luan Pulling' test: positive, mild Speed's test: negative Capsulitis - pain w/ passive ER: no Crossed arm test: Mildly positive Crank: Not evaluated Anterior apprehension: Negative Posterior apprehension: Not evaluated  Cervical Spine:  Supple, non-tender.  ROM: Flexion: 40 Extension: 30 Left/Right turn: 65/65 Left/Right tilt: 35/35  Imaging Review: Shoulder X-Ray Imaging: True AP, Y-scapular, and axillary views of the right shoulder were obtained. These films demonstrate no evidence for fractures, lytic lesions, or significant degenerative changes. The subacromial space is mildly decreased. There is no subacromial or infra-clavicular spurring. He demonstrates a Type II acromion.  Cervical X-Ray Imaging: AP, lateral, and oblique views of the cervical spine were obtained. These films demonstrate mild multilevel degenerative changes as manifest by anterior osteophyte formation. There is no evidence of spondylolysis or spondylolisthesis. No lytic lesions or fractures are identified.   Assessment: Right shoulder pain Complete tear of right rotator cuff  Plan: The treatment options were discussed with the patient. In addition, patient educational materials were provided regarding the diagnosis and treatment options. The patient is quite frustrated by his persistent symptoms and functional limitations, and is ready to consider more aggressive treatment options. Therefore, I have recommended a shoulder arthroscopy with debridement, decompression, and repair of his rotator cuff tear. The procedure was discussed with the patient, as were the potential risks (including bleeding, infection, nerve and/or blood vessel injury, persistent or recurrent  pain, failure of the repair, progression of arthritis, need for further surgery, blood clots, strokes, heart attacks and/or arhythmias, pneumonia, etc.) and benefits. The patient states his understanding and wishes to proceed. All of the patient's questions and concerns were answered. He can call any time with further concerns. He will follow up post-surgery, routine.    Raquel James, PA-C [place]

## 2016-10-03 NOTE — Op Note (Signed)
10/03/2016  12:40 PM  Patient:   Zachary Gilmore  Pre-Op Diagnosis:   Impingement/tendinopathy with rotator cuff tear, right shoulder.  Postoperative diagnosis: Impingement/tendinopathy with rotator cuff tear, degenerative labral fraying, and biceps tendinopathy, right shoulder.  Procedure: Extensive arthroscopic debridement with biceps tenolysis, right shoulder.  Anesthesia: General endotracheal with interscalene block placed preoperatively by the anesthesiologist.  Surgeon:   Pascal Lux, MD  Assistant:   Cameron Proud, PA-C; Sung Amabile, PA-S  Findings: As above. There were grade 1 chondral malacia changes involving the central glenoid and humeral head. There was extensive degenerative fraying of the labrum posterior superiorly, superior, and anteriorly. There was a partial-thickness tear involving the superior portion of the subscapularis tendon, and a full-thickness tear involving the anterior half of the supraspinatus tendon.  Complications: None  Fluids:   800 cc  Estimated blood loss: 5 cc  Tourniquet time: None  Drains: None  Closure: Staples   Brief clinical note: The patient is a 61 year old male with a history of right shoulder pain. The patient's symptoms have progressed despite medications, activity modification, etc. The patient's history and examination are consistent with impingement/tendinopathy with a rotator cuff tear. These findings were confirmed by MRI scan. The patient presents at this time for definitive management of these shoulder symptoms.  Procedure: The patient underwent placement of an interscalene block by the anesthesiologist in the preoperative holding area before he was brought into the operating room and lain in the supine position. The patient then underwent general endotracheal intubation and anesthesia before being repositioned in the beach chair position using the beach chair positioner. The right shoulder and upper  extremity were prepped with ChloraPrep solution before being draped sterilely. Preoperative antibiotics were administered. A timeout was performed to confirm the proper surgical site before the expected portal sites and incision site were injected with 0.5% Sensorcaine with epinephrine. A posterior portal was created and the glenohumeral joint thoroughly inspected with the findings as described above. An anterior portal was created using an outside-in technique. The labrum and rotator cuff were further probed, again confirming the above-noted findings. Areas of degenerative fraying of the superior portion of the labrum, as well as areas of synovitis were debrided back to stable margins using the full-radius resector. The ArthroCare wand was inserted and used to release the severely degenerative biceps tendon from its attachment into the labrum, as well as to obtain hemostasis and to "anneal" the labrum superiorly and anteriorly.   At this point, the patient began to demonstrate some EKG changes with elevations in the ST segments. The anesthesiologist grew concerned and requested that we stop the case immediately. Therefore, the instruments were removed from the joint after suctioning the excess fluid and the portal sites were closed using staples. A sterile bulky dressing was applied to the shoulder before the arm was placed into a standard shoulder sling. The patient was then awakened, extubated, and returned to the recovery room in reasonably stable condition after tolerating the procedure well. He was to obtain a cardiac consultation upon arrival to the recovery room.

## 2016-10-03 NOTE — Progress Notes (Signed)
Dr Nehemiah Massed and Dr Rosey Bath at bedside. Repeat EKG ordered.

## 2016-10-03 NOTE — Anesthesia Procedure Notes (Signed)
Procedure Name: Intubation Date/Time: 10/03/2016 11:06 AM Performed by: Darlyne Russian Pre-anesthesia Checklist: Patient identified, Emergency Drugs available, Suction available, Patient being monitored and Timeout performed Patient Re-evaluated:Patient Re-evaluated prior to inductionOxygen Delivery Method: Circle system utilized Preoxygenation: Pre-oxygenation with 100% oxygen Intubation Type: IV induction Ventilation: Mask ventilation without difficulty Laryngoscope Size: Mac and 4 Grade View: Grade II Tube type: Oral Tube size: 7.5 mm Number of attempts: 1 Airway Equipment and Method: Stylet Placement Confirmation: ETT inserted through vocal cords under direct vision,  positive ETCO2 and breath sounds checked- equal and bilateral Secured at: 22 cm Tube secured with: Tape Dental Injury: Teeth and Oropharynx as per pre-operative assessment

## 2016-10-04 ENCOUNTER — Encounter: Payer: Self-pay | Admitting: Internal Medicine

## 2016-10-04 NOTE — Anesthesia Postprocedure Evaluation (Signed)
Anesthesia Post Note  Patient: Zachary Gilmore  Procedure(s) Performed: Procedure(s) (LRB): SHOULDER ARTHROSCOPIC LIMITED DEBRIDEMENT  WITH BICEPS TENOLYSIS (Right)  Patient location during evaluation: PACU Anesthesia Type: Regional and General Level of consciousness: awake and alert Pain management: pain level controlled Vital Signs Assessment: post-procedure vital signs reviewed and stable Respiratory status: spontaneous breathing, nonlabored ventilation, respiratory function stable and patient connected to nasal cannula oxygen Cardiovascular status: blood pressure returned to baseline and stable Postop Assessment: no signs of nausea or vomiting Anesthetic complications: no Comments: Patient was cancelled intraop after developing significant ST elevation during the procedure.  Cardiology called and they have seen the patient with a plan for cardiac catheterization later today.  He denies chest pain and has been stable throughout his course in the PACU.     Last Vitals:  Vitals:   10/03/16 1730 10/03/16 1800  BP: (!) 170/93   Pulse: (!) 48 (!) 48  Resp: 17 16  Temp:      Last Pain:  Vitals:   10/03/16 1800  TempSrc:   PainSc: 2                  Martha Clan

## 2016-10-05 DIAGNOSIS — S46101A Unspecified injury of muscle, fascia and tendon of long head of biceps, right arm, initial encounter: Secondary | ICD-10-CM | POA: Insufficient documentation

## 2016-10-13 DIAGNOSIS — I2510 Atherosclerotic heart disease of native coronary artery without angina pectoris: Secondary | ICD-10-CM | POA: Insufficient documentation

## 2016-11-09 ENCOUNTER — Other Ambulatory Visit: Payer: Self-pay

## 2016-11-09 DIAGNOSIS — B182 Chronic viral hepatitis C: Secondary | ICD-10-CM

## 2016-11-13 ENCOUNTER — Ambulatory Visit: Payer: Medicare HMO | Admitting: Gastroenterology

## 2016-12-05 ENCOUNTER — Other Ambulatory Visit: Payer: Self-pay

## 2016-12-05 ENCOUNTER — Ambulatory Visit: Payer: Medicare HMO | Admitting: Gastroenterology

## 2017-02-09 DIAGNOSIS — F1027 Alcohol dependence with alcohol-induced persisting dementia: Secondary | ICD-10-CM | POA: Diagnosis not present

## 2017-02-10 DIAGNOSIS — F1027 Alcohol dependence with alcohol-induced persisting dementia: Secondary | ICD-10-CM | POA: Diagnosis not present

## 2017-02-11 DIAGNOSIS — F1027 Alcohol dependence with alcohol-induced persisting dementia: Secondary | ICD-10-CM | POA: Diagnosis not present

## 2017-02-12 DIAGNOSIS — F1027 Alcohol dependence with alcohol-induced persisting dementia: Secondary | ICD-10-CM | POA: Diagnosis not present

## 2017-02-13 DIAGNOSIS — F1027 Alcohol dependence with alcohol-induced persisting dementia: Secondary | ICD-10-CM | POA: Diagnosis not present

## 2017-02-13 DIAGNOSIS — B1920 Unspecified viral hepatitis C without hepatic coma: Secondary | ICD-10-CM | POA: Diagnosis not present

## 2017-02-13 DIAGNOSIS — K219 Gastro-esophageal reflux disease without esophagitis: Secondary | ICD-10-CM | POA: Diagnosis not present

## 2017-02-13 DIAGNOSIS — M1990 Unspecified osteoarthritis, unspecified site: Secondary | ICD-10-CM | POA: Diagnosis not present

## 2017-02-13 DIAGNOSIS — F17290 Nicotine dependence, other tobacco product, uncomplicated: Secondary | ICD-10-CM | POA: Diagnosis not present

## 2017-02-14 DIAGNOSIS — F1027 Alcohol dependence with alcohol-induced persisting dementia: Secondary | ICD-10-CM | POA: Diagnosis not present

## 2017-02-15 DIAGNOSIS — F1027 Alcohol dependence with alcohol-induced persisting dementia: Secondary | ICD-10-CM | POA: Diagnosis not present

## 2017-02-16 DIAGNOSIS — F1027 Alcohol dependence with alcohol-induced persisting dementia: Secondary | ICD-10-CM | POA: Diagnosis not present

## 2017-02-17 DIAGNOSIS — F1027 Alcohol dependence with alcohol-induced persisting dementia: Secondary | ICD-10-CM | POA: Diagnosis not present

## 2017-02-18 DIAGNOSIS — F1027 Alcohol dependence with alcohol-induced persisting dementia: Secondary | ICD-10-CM | POA: Diagnosis not present

## 2017-02-19 DIAGNOSIS — B182 Chronic viral hepatitis C: Secondary | ICD-10-CM | POA: Diagnosis not present

## 2017-02-19 DIAGNOSIS — F1027 Alcohol dependence with alcohol-induced persisting dementia: Secondary | ICD-10-CM | POA: Diagnosis not present

## 2017-02-19 DIAGNOSIS — F1010 Alcohol abuse, uncomplicated: Secondary | ICD-10-CM | POA: Diagnosis not present

## 2017-02-19 DIAGNOSIS — Z79899 Other long term (current) drug therapy: Secondary | ICD-10-CM | POA: Diagnosis not present

## 2017-02-19 DIAGNOSIS — M1990 Unspecified osteoarthritis, unspecified site: Secondary | ICD-10-CM | POA: Diagnosis not present

## 2017-02-20 DIAGNOSIS — F1027 Alcohol dependence with alcohol-induced persisting dementia: Secondary | ICD-10-CM | POA: Diagnosis not present

## 2017-02-21 DIAGNOSIS — F1027 Alcohol dependence with alcohol-induced persisting dementia: Secondary | ICD-10-CM | POA: Diagnosis not present

## 2017-02-22 DIAGNOSIS — F1027 Alcohol dependence with alcohol-induced persisting dementia: Secondary | ICD-10-CM | POA: Diagnosis not present

## 2017-02-23 DIAGNOSIS — F1027 Alcohol dependence with alcohol-induced persisting dementia: Secondary | ICD-10-CM | POA: Diagnosis not present

## 2017-02-24 DIAGNOSIS — F1027 Alcohol dependence with alcohol-induced persisting dementia: Secondary | ICD-10-CM | POA: Diagnosis not present

## 2017-02-25 DIAGNOSIS — F1027 Alcohol dependence with alcohol-induced persisting dementia: Secondary | ICD-10-CM | POA: Diagnosis not present

## 2017-02-26 DIAGNOSIS — F1027 Alcohol dependence with alcohol-induced persisting dementia: Secondary | ICD-10-CM | POA: Diagnosis not present

## 2017-02-27 DIAGNOSIS — F1027 Alcohol dependence with alcohol-induced persisting dementia: Secondary | ICD-10-CM | POA: Diagnosis not present

## 2017-02-27 DIAGNOSIS — F17200 Nicotine dependence, unspecified, uncomplicated: Secondary | ICD-10-CM | POA: Diagnosis not present

## 2017-02-27 DIAGNOSIS — F1010 Alcohol abuse, uncomplicated: Secondary | ICD-10-CM | POA: Diagnosis not present

## 2017-02-28 DIAGNOSIS — F1027 Alcohol dependence with alcohol-induced persisting dementia: Secondary | ICD-10-CM | POA: Diagnosis not present

## 2017-03-01 DIAGNOSIS — F1027 Alcohol dependence with alcohol-induced persisting dementia: Secondary | ICD-10-CM | POA: Diagnosis not present

## 2017-03-02 DIAGNOSIS — F1027 Alcohol dependence with alcohol-induced persisting dementia: Secondary | ICD-10-CM | POA: Diagnosis not present

## 2017-03-03 DIAGNOSIS — F1027 Alcohol dependence with alcohol-induced persisting dementia: Secondary | ICD-10-CM | POA: Diagnosis not present

## 2017-03-04 DIAGNOSIS — F1027 Alcohol dependence with alcohol-induced persisting dementia: Secondary | ICD-10-CM | POA: Diagnosis not present

## 2017-03-05 DIAGNOSIS — F1027 Alcohol dependence with alcohol-induced persisting dementia: Secondary | ICD-10-CM | POA: Diagnosis not present

## 2017-03-06 DIAGNOSIS — F1027 Alcohol dependence with alcohol-induced persisting dementia: Secondary | ICD-10-CM | POA: Diagnosis not present

## 2017-03-07 DIAGNOSIS — F1027 Alcohol dependence with alcohol-induced persisting dementia: Secondary | ICD-10-CM | POA: Diagnosis not present

## 2017-03-08 DIAGNOSIS — F1027 Alcohol dependence with alcohol-induced persisting dementia: Secondary | ICD-10-CM | POA: Diagnosis not present

## 2017-03-09 DIAGNOSIS — F1027 Alcohol dependence with alcohol-induced persisting dementia: Secondary | ICD-10-CM | POA: Diagnosis not present

## 2017-03-10 DIAGNOSIS — F1027 Alcohol dependence with alcohol-induced persisting dementia: Secondary | ICD-10-CM | POA: Diagnosis not present

## 2017-03-11 DIAGNOSIS — F1027 Alcohol dependence with alcohol-induced persisting dementia: Secondary | ICD-10-CM | POA: Diagnosis not present

## 2017-03-12 DIAGNOSIS — K219 Gastro-esophageal reflux disease without esophagitis: Secondary | ICD-10-CM | POA: Diagnosis not present

## 2017-03-12 DIAGNOSIS — R972 Elevated prostate specific antigen [PSA]: Secondary | ICD-10-CM | POA: Diagnosis not present

## 2017-03-12 DIAGNOSIS — F1027 Alcohol dependence with alcohol-induced persisting dementia: Secondary | ICD-10-CM | POA: Diagnosis not present

## 2017-03-12 DIAGNOSIS — F17200 Nicotine dependence, unspecified, uncomplicated: Secondary | ICD-10-CM | POA: Diagnosis not present

## 2017-03-12 DIAGNOSIS — M1990 Unspecified osteoarthritis, unspecified site: Secondary | ICD-10-CM | POA: Diagnosis not present

## 2017-03-13 DIAGNOSIS — F1027 Alcohol dependence with alcohol-induced persisting dementia: Secondary | ICD-10-CM | POA: Diagnosis not present

## 2017-03-14 DIAGNOSIS — F1027 Alcohol dependence with alcohol-induced persisting dementia: Secondary | ICD-10-CM | POA: Diagnosis not present

## 2017-03-15 DIAGNOSIS — F1027 Alcohol dependence with alcohol-induced persisting dementia: Secondary | ICD-10-CM | POA: Diagnosis not present

## 2017-03-16 DIAGNOSIS — F1027 Alcohol dependence with alcohol-induced persisting dementia: Secondary | ICD-10-CM | POA: Diagnosis not present

## 2017-03-17 DIAGNOSIS — F1027 Alcohol dependence with alcohol-induced persisting dementia: Secondary | ICD-10-CM | POA: Diagnosis not present

## 2017-03-18 DIAGNOSIS — F1027 Alcohol dependence with alcohol-induced persisting dementia: Secondary | ICD-10-CM | POA: Diagnosis not present

## 2017-03-19 DIAGNOSIS — F1027 Alcohol dependence with alcohol-induced persisting dementia: Secondary | ICD-10-CM | POA: Diagnosis not present

## 2017-03-20 DIAGNOSIS — F1027 Alcohol dependence with alcohol-induced persisting dementia: Secondary | ICD-10-CM | POA: Diagnosis not present

## 2017-03-21 DIAGNOSIS — F1027 Alcohol dependence with alcohol-induced persisting dementia: Secondary | ICD-10-CM | POA: Diagnosis not present

## 2017-03-22 DIAGNOSIS — F1027 Alcohol dependence with alcohol-induced persisting dementia: Secondary | ICD-10-CM | POA: Diagnosis not present

## 2017-03-23 DIAGNOSIS — F1027 Alcohol dependence with alcohol-induced persisting dementia: Secondary | ICD-10-CM | POA: Diagnosis not present

## 2017-03-24 DIAGNOSIS — K219 Gastro-esophageal reflux disease without esophagitis: Secondary | ICD-10-CM | POA: Diagnosis not present

## 2017-03-24 DIAGNOSIS — Z681 Body mass index (BMI) 19 or less, adult: Secondary | ICD-10-CM | POA: Diagnosis not present

## 2017-03-24 DIAGNOSIS — J410 Simple chronic bronchitis: Secondary | ICD-10-CM | POA: Diagnosis not present

## 2017-03-24 DIAGNOSIS — B182 Chronic viral hepatitis C: Secondary | ICD-10-CM | POA: Diagnosis not present

## 2017-03-24 DIAGNOSIS — F1921 Other psychoactive substance dependence, in remission: Secondary | ICD-10-CM | POA: Diagnosis not present

## 2017-03-24 DIAGNOSIS — G629 Polyneuropathy, unspecified: Secondary | ICD-10-CM | POA: Diagnosis not present

## 2017-03-24 DIAGNOSIS — F1027 Alcohol dependence with alcohol-induced persisting dementia: Secondary | ICD-10-CM | POA: Diagnosis not present

## 2017-03-24 DIAGNOSIS — F17200 Nicotine dependence, unspecified, uncomplicated: Secondary | ICD-10-CM | POA: Diagnosis not present

## 2017-03-24 DIAGNOSIS — M19019 Primary osteoarthritis, unspecified shoulder: Secondary | ICD-10-CM | POA: Diagnosis not present

## 2017-03-24 DIAGNOSIS — R413 Other amnesia: Secondary | ICD-10-CM | POA: Diagnosis not present

## 2017-03-25 DIAGNOSIS — F1027 Alcohol dependence with alcohol-induced persisting dementia: Secondary | ICD-10-CM | POA: Diagnosis not present

## 2017-03-26 DIAGNOSIS — F1027 Alcohol dependence with alcohol-induced persisting dementia: Secondary | ICD-10-CM | POA: Diagnosis not present

## 2017-03-27 DIAGNOSIS — F1027 Alcohol dependence with alcohol-induced persisting dementia: Secondary | ICD-10-CM | POA: Diagnosis not present

## 2017-03-28 DIAGNOSIS — F1027 Alcohol dependence with alcohol-induced persisting dementia: Secondary | ICD-10-CM | POA: Diagnosis not present

## 2017-03-29 DIAGNOSIS — F1027 Alcohol dependence with alcohol-induced persisting dementia: Secondary | ICD-10-CM | POA: Diagnosis not present

## 2017-03-30 DIAGNOSIS — F1010 Alcohol abuse, uncomplicated: Secondary | ICD-10-CM | POA: Diagnosis not present

## 2017-03-30 DIAGNOSIS — F1027 Alcohol dependence with alcohol-induced persisting dementia: Secondary | ICD-10-CM | POA: Diagnosis not present

## 2017-03-30 DIAGNOSIS — F17200 Nicotine dependence, unspecified, uncomplicated: Secondary | ICD-10-CM | POA: Diagnosis not present

## 2017-03-31 DIAGNOSIS — F1027 Alcohol dependence with alcohol-induced persisting dementia: Secondary | ICD-10-CM | POA: Diagnosis not present

## 2017-04-01 DIAGNOSIS — F1027 Alcohol dependence with alcohol-induced persisting dementia: Secondary | ICD-10-CM | POA: Diagnosis not present

## 2017-04-02 DIAGNOSIS — F1027 Alcohol dependence with alcohol-induced persisting dementia: Secondary | ICD-10-CM | POA: Diagnosis not present

## 2017-04-03 DIAGNOSIS — F1027 Alcohol dependence with alcohol-induced persisting dementia: Secondary | ICD-10-CM | POA: Diagnosis not present

## 2017-04-04 DIAGNOSIS — F1027 Alcohol dependence with alcohol-induced persisting dementia: Secondary | ICD-10-CM | POA: Diagnosis not present

## 2017-04-05 DIAGNOSIS — F1027 Alcohol dependence with alcohol-induced persisting dementia: Secondary | ICD-10-CM | POA: Diagnosis not present

## 2017-04-06 DIAGNOSIS — F1027 Alcohol dependence with alcohol-induced persisting dementia: Secondary | ICD-10-CM | POA: Diagnosis not present

## 2017-04-07 DIAGNOSIS — F1027 Alcohol dependence with alcohol-induced persisting dementia: Secondary | ICD-10-CM | POA: Diagnosis not present

## 2017-04-08 DIAGNOSIS — F1027 Alcohol dependence with alcohol-induced persisting dementia: Secondary | ICD-10-CM | POA: Diagnosis not present

## 2017-04-09 DIAGNOSIS — F1027 Alcohol dependence with alcohol-induced persisting dementia: Secondary | ICD-10-CM | POA: Diagnosis not present

## 2017-04-10 DIAGNOSIS — F1027 Alcohol dependence with alcohol-induced persisting dementia: Secondary | ICD-10-CM | POA: Diagnosis not present

## 2017-04-11 DIAGNOSIS — F1027 Alcohol dependence with alcohol-induced persisting dementia: Secondary | ICD-10-CM | POA: Diagnosis not present

## 2017-04-12 DIAGNOSIS — F1027 Alcohol dependence with alcohol-induced persisting dementia: Secondary | ICD-10-CM | POA: Diagnosis not present

## 2017-04-13 DIAGNOSIS — F1027 Alcohol dependence with alcohol-induced persisting dementia: Secondary | ICD-10-CM | POA: Diagnosis not present

## 2017-04-14 DIAGNOSIS — F1027 Alcohol dependence with alcohol-induced persisting dementia: Secondary | ICD-10-CM | POA: Diagnosis not present

## 2017-04-15 DIAGNOSIS — F1027 Alcohol dependence with alcohol-induced persisting dementia: Secondary | ICD-10-CM | POA: Diagnosis not present

## 2017-04-16 DIAGNOSIS — M1990 Unspecified osteoarthritis, unspecified site: Secondary | ICD-10-CM | POA: Diagnosis not present

## 2017-04-16 DIAGNOSIS — R972 Elevated prostate specific antigen [PSA]: Secondary | ICD-10-CM | POA: Diagnosis not present

## 2017-04-16 DIAGNOSIS — F1027 Alcohol dependence with alcohol-induced persisting dementia: Secondary | ICD-10-CM | POA: Diagnosis not present

## 2017-04-16 DIAGNOSIS — F17200 Nicotine dependence, unspecified, uncomplicated: Secondary | ICD-10-CM | POA: Diagnosis not present

## 2017-04-16 DIAGNOSIS — K219 Gastro-esophageal reflux disease without esophagitis: Secondary | ICD-10-CM | POA: Diagnosis not present

## 2017-04-17 DIAGNOSIS — F1027 Alcohol dependence with alcohol-induced persisting dementia: Secondary | ICD-10-CM | POA: Diagnosis not present

## 2017-04-18 DIAGNOSIS — B182 Chronic viral hepatitis C: Secondary | ICD-10-CM | POA: Diagnosis not present

## 2017-04-18 DIAGNOSIS — F1010 Alcohol abuse, uncomplicated: Secondary | ICD-10-CM | POA: Diagnosis not present

## 2017-04-18 DIAGNOSIS — Z79899 Other long term (current) drug therapy: Secondary | ICD-10-CM | POA: Diagnosis not present

## 2017-04-18 DIAGNOSIS — F1027 Alcohol dependence with alcohol-induced persisting dementia: Secondary | ICD-10-CM | POA: Diagnosis not present

## 2017-04-18 DIAGNOSIS — M1990 Unspecified osteoarthritis, unspecified site: Secondary | ICD-10-CM | POA: Diagnosis not present

## 2017-04-19 DIAGNOSIS — F1027 Alcohol dependence with alcohol-induced persisting dementia: Secondary | ICD-10-CM | POA: Diagnosis not present

## 2017-04-20 DIAGNOSIS — F1027 Alcohol dependence with alcohol-induced persisting dementia: Secondary | ICD-10-CM | POA: Diagnosis not present

## 2017-04-21 DIAGNOSIS — F1027 Alcohol dependence with alcohol-induced persisting dementia: Secondary | ICD-10-CM | POA: Diagnosis not present

## 2017-04-22 DIAGNOSIS — F1027 Alcohol dependence with alcohol-induced persisting dementia: Secondary | ICD-10-CM | POA: Diagnosis not present

## 2017-04-23 DIAGNOSIS — F1027 Alcohol dependence with alcohol-induced persisting dementia: Secondary | ICD-10-CM | POA: Diagnosis not present

## 2017-04-24 ENCOUNTER — Ambulatory Visit (INDEPENDENT_AMBULATORY_CARE_PROVIDER_SITE_OTHER): Payer: Medicare HMO | Admitting: Urology

## 2017-04-24 ENCOUNTER — Other Ambulatory Visit: Payer: Self-pay | Admitting: Urology

## 2017-04-24 DIAGNOSIS — F1027 Alcohol dependence with alcohol-induced persisting dementia: Secondary | ICD-10-CM | POA: Diagnosis not present

## 2017-04-24 DIAGNOSIS — R972 Elevated prostate specific antigen [PSA]: Secondary | ICD-10-CM

## 2017-04-25 DIAGNOSIS — F1027 Alcohol dependence with alcohol-induced persisting dementia: Secondary | ICD-10-CM | POA: Diagnosis not present

## 2017-04-26 DIAGNOSIS — F1027 Alcohol dependence with alcohol-induced persisting dementia: Secondary | ICD-10-CM | POA: Diagnosis not present

## 2017-04-27 DIAGNOSIS — F1027 Alcohol dependence with alcohol-induced persisting dementia: Secondary | ICD-10-CM | POA: Diagnosis not present

## 2017-04-28 DIAGNOSIS — F1027 Alcohol dependence with alcohol-induced persisting dementia: Secondary | ICD-10-CM | POA: Diagnosis not present

## 2017-04-29 DIAGNOSIS — F1027 Alcohol dependence with alcohol-induced persisting dementia: Secondary | ICD-10-CM | POA: Diagnosis not present

## 2017-04-30 DIAGNOSIS — F1027 Alcohol dependence with alcohol-induced persisting dementia: Secondary | ICD-10-CM | POA: Diagnosis not present

## 2017-05-01 DIAGNOSIS — F1010 Alcohol abuse, uncomplicated: Secondary | ICD-10-CM | POA: Diagnosis not present

## 2017-05-01 DIAGNOSIS — F1027 Alcohol dependence with alcohol-induced persisting dementia: Secondary | ICD-10-CM | POA: Diagnosis not present

## 2017-05-01 DIAGNOSIS — F17200 Nicotine dependence, unspecified, uncomplicated: Secondary | ICD-10-CM | POA: Diagnosis not present

## 2017-05-02 DIAGNOSIS — F1027 Alcohol dependence with alcohol-induced persisting dementia: Secondary | ICD-10-CM | POA: Diagnosis not present

## 2017-05-03 DIAGNOSIS — F1027 Alcohol dependence with alcohol-induced persisting dementia: Secondary | ICD-10-CM | POA: Diagnosis not present

## 2017-05-04 DIAGNOSIS — F1027 Alcohol dependence with alcohol-induced persisting dementia: Secondary | ICD-10-CM | POA: Diagnosis not present

## 2017-05-05 DIAGNOSIS — F1027 Alcohol dependence with alcohol-induced persisting dementia: Secondary | ICD-10-CM | POA: Diagnosis not present

## 2017-05-06 DIAGNOSIS — F1027 Alcohol dependence with alcohol-induced persisting dementia: Secondary | ICD-10-CM | POA: Diagnosis not present

## 2017-05-07 DIAGNOSIS — I2510 Atherosclerotic heart disease of native coronary artery without angina pectoris: Secondary | ICD-10-CM | POA: Diagnosis not present

## 2017-05-07 DIAGNOSIS — F1027 Alcohol dependence with alcohol-induced persisting dementia: Secondary | ICD-10-CM | POA: Diagnosis not present

## 2017-05-08 DIAGNOSIS — F1027 Alcohol dependence with alcohol-induced persisting dementia: Secondary | ICD-10-CM | POA: Diagnosis not present

## 2017-05-09 ENCOUNTER — Ambulatory Visit (HOSPITAL_COMMUNITY)
Admission: RE | Admit: 2017-05-09 | Discharge: 2017-05-09 | Disposition: A | Discharge status: Home / Self Care | Payer: Medicare HMO | Source: Ambulatory Visit | Attending: Urology | Admitting: Urology

## 2017-05-09 DIAGNOSIS — F1027 Alcohol dependence with alcohol-induced persisting dementia: Secondary | ICD-10-CM | POA: Diagnosis not present

## 2017-05-10 DIAGNOSIS — F1027 Alcohol dependence with alcohol-induced persisting dementia: Secondary | ICD-10-CM | POA: Diagnosis not present

## 2017-05-11 DIAGNOSIS — F1027 Alcohol dependence with alcohol-induced persisting dementia: Secondary | ICD-10-CM | POA: Diagnosis not present

## 2017-05-12 DIAGNOSIS — F1027 Alcohol dependence with alcohol-induced persisting dementia: Secondary | ICD-10-CM | POA: Diagnosis not present

## 2017-05-13 DIAGNOSIS — F1027 Alcohol dependence with alcohol-induced persisting dementia: Secondary | ICD-10-CM | POA: Diagnosis not present

## 2017-05-14 DIAGNOSIS — F1027 Alcohol dependence with alcohol-induced persisting dementia: Secondary | ICD-10-CM | POA: Diagnosis not present

## 2017-05-15 DIAGNOSIS — F1027 Alcohol dependence with alcohol-induced persisting dementia: Secondary | ICD-10-CM | POA: Diagnosis not present

## 2017-05-16 DIAGNOSIS — F1027 Alcohol dependence with alcohol-induced persisting dementia: Secondary | ICD-10-CM | POA: Diagnosis not present

## 2017-05-17 DIAGNOSIS — F1027 Alcohol dependence with alcohol-induced persisting dementia: Secondary | ICD-10-CM | POA: Diagnosis not present

## 2017-05-18 DIAGNOSIS — F1027 Alcohol dependence with alcohol-induced persisting dementia: Secondary | ICD-10-CM | POA: Diagnosis not present

## 2017-05-19 DIAGNOSIS — F1027 Alcohol dependence with alcohol-induced persisting dementia: Secondary | ICD-10-CM | POA: Diagnosis not present

## 2017-05-20 DIAGNOSIS — F1027 Alcohol dependence with alcohol-induced persisting dementia: Secondary | ICD-10-CM | POA: Diagnosis not present

## 2017-05-21 DIAGNOSIS — F1027 Alcohol dependence with alcohol-induced persisting dementia: Secondary | ICD-10-CM | POA: Diagnosis not present

## 2017-05-22 DIAGNOSIS — K219 Gastro-esophageal reflux disease without esophagitis: Secondary | ICD-10-CM | POA: Diagnosis not present

## 2017-05-22 DIAGNOSIS — F1027 Alcohol dependence with alcohol-induced persisting dementia: Secondary | ICD-10-CM | POA: Diagnosis not present

## 2017-05-22 DIAGNOSIS — F17200 Nicotine dependence, unspecified, uncomplicated: Secondary | ICD-10-CM | POA: Diagnosis not present

## 2017-05-22 DIAGNOSIS — R972 Elevated prostate specific antigen [PSA]: Secondary | ICD-10-CM | POA: Diagnosis not present

## 2017-05-23 DIAGNOSIS — F1027 Alcohol dependence with alcohol-induced persisting dementia: Secondary | ICD-10-CM | POA: Diagnosis not present

## 2017-05-24 DIAGNOSIS — F1027 Alcohol dependence with alcohol-induced persisting dementia: Secondary | ICD-10-CM | POA: Diagnosis not present

## 2017-05-25 DIAGNOSIS — F1027 Alcohol dependence with alcohol-induced persisting dementia: Secondary | ICD-10-CM | POA: Diagnosis not present

## 2017-05-26 DIAGNOSIS — F1027 Alcohol dependence with alcohol-induced persisting dementia: Secondary | ICD-10-CM | POA: Diagnosis not present

## 2017-05-27 DIAGNOSIS — F1027 Alcohol dependence with alcohol-induced persisting dementia: Secondary | ICD-10-CM | POA: Diagnosis not present

## 2017-05-28 DIAGNOSIS — F1027 Alcohol dependence with alcohol-induced persisting dementia: Secondary | ICD-10-CM | POA: Diagnosis not present

## 2017-05-29 DIAGNOSIS — F1027 Alcohol dependence with alcohol-induced persisting dementia: Secondary | ICD-10-CM | POA: Diagnosis not present

## 2017-05-30 DIAGNOSIS — F1027 Alcohol dependence with alcohol-induced persisting dementia: Secondary | ICD-10-CM | POA: Diagnosis not present

## 2017-05-31 DIAGNOSIS — F1027 Alcohol dependence with alcohol-induced persisting dementia: Secondary | ICD-10-CM | POA: Diagnosis not present

## 2017-06-01 DIAGNOSIS — F1027 Alcohol dependence with alcohol-induced persisting dementia: Secondary | ICD-10-CM | POA: Diagnosis not present

## 2017-06-02 DIAGNOSIS — F1027 Alcohol dependence with alcohol-induced persisting dementia: Secondary | ICD-10-CM | POA: Diagnosis not present

## 2017-06-03 DIAGNOSIS — F1027 Alcohol dependence with alcohol-induced persisting dementia: Secondary | ICD-10-CM | POA: Diagnosis not present

## 2017-06-04 DIAGNOSIS — F1027 Alcohol dependence with alcohol-induced persisting dementia: Secondary | ICD-10-CM | POA: Diagnosis not present

## 2017-06-04 DIAGNOSIS — Z79899 Other long term (current) drug therapy: Secondary | ICD-10-CM | POA: Diagnosis not present

## 2017-06-04 DIAGNOSIS — B182 Chronic viral hepatitis C: Secondary | ICD-10-CM | POA: Diagnosis not present

## 2017-06-04 DIAGNOSIS — F1010 Alcohol abuse, uncomplicated: Secondary | ICD-10-CM | POA: Diagnosis not present

## 2017-06-04 DIAGNOSIS — M1990 Unspecified osteoarthritis, unspecified site: Secondary | ICD-10-CM | POA: Diagnosis not present

## 2017-06-05 DIAGNOSIS — F1027 Alcohol dependence with alcohol-induced persisting dementia: Secondary | ICD-10-CM | POA: Diagnosis not present

## 2017-06-06 DIAGNOSIS — F1027 Alcohol dependence with alcohol-induced persisting dementia: Secondary | ICD-10-CM | POA: Diagnosis not present

## 2017-06-06 DIAGNOSIS — F1010 Alcohol abuse, uncomplicated: Secondary | ICD-10-CM | POA: Diagnosis not present

## 2017-06-06 DIAGNOSIS — F17200 Nicotine dependence, unspecified, uncomplicated: Secondary | ICD-10-CM | POA: Diagnosis not present

## 2017-06-07 DIAGNOSIS — F1027 Alcohol dependence with alcohol-induced persisting dementia: Secondary | ICD-10-CM | POA: Diagnosis not present

## 2017-06-08 DIAGNOSIS — F1027 Alcohol dependence with alcohol-induced persisting dementia: Secondary | ICD-10-CM | POA: Diagnosis not present

## 2017-06-09 DIAGNOSIS — F1027 Alcohol dependence with alcohol-induced persisting dementia: Secondary | ICD-10-CM | POA: Diagnosis not present

## 2017-06-10 DIAGNOSIS — F1027 Alcohol dependence with alcohol-induced persisting dementia: Secondary | ICD-10-CM | POA: Diagnosis not present

## 2017-06-11 DIAGNOSIS — F1027 Alcohol dependence with alcohol-induced persisting dementia: Secondary | ICD-10-CM | POA: Diagnosis not present

## 2017-06-12 DIAGNOSIS — F1027 Alcohol dependence with alcohol-induced persisting dementia: Secondary | ICD-10-CM | POA: Diagnosis not present

## 2017-06-13 ENCOUNTER — Ambulatory Visit (HOSPITAL_COMMUNITY): Payer: Medicare HMO

## 2017-06-13 ENCOUNTER — Encounter (HOSPITAL_COMMUNITY): Payer: Self-pay

## 2017-06-13 ENCOUNTER — Ambulatory Visit (HOSPITAL_COMMUNITY)
Admission: RE | Admit: 2017-06-13 | Discharge: 2017-06-13 | Disposition: A | Discharge status: Home / Self Care | Payer: Medicare HMO | Source: Ambulatory Visit | Attending: Urology | Admitting: Urology

## 2017-06-13 DIAGNOSIS — C61 Malignant neoplasm of prostate: Secondary | ICD-10-CM | POA: Diagnosis not present

## 2017-06-13 DIAGNOSIS — F1027 Alcohol dependence with alcohol-induced persisting dementia: Secondary | ICD-10-CM | POA: Diagnosis not present

## 2017-06-13 DIAGNOSIS — R972 Elevated prostate specific antigen [PSA]: Secondary | ICD-10-CM | POA: Diagnosis not present

## 2017-06-13 MED ORDER — CEFTRIAXONE SODIUM 1 G IJ SOLR
1.0000 g | Freq: Once | INTRAMUSCULAR | Status: AC
  Administered 2017-06-13: 1 g via INTRAMUSCULAR

## 2017-06-13 MED ORDER — CEFTRIAXONE SODIUM 1 G IJ SOLR
INTRAMUSCULAR | Status: AC
  Administered 2017-06-13: 1 g via INTRAMUSCULAR
  Filled 2017-06-13: qty 10

## 2017-06-13 MED ORDER — LIDOCAINE HCL (PF) 1 % IJ SOLN
INTRAMUSCULAR | Status: AC
  Filled 2017-06-13: qty 5

## 2017-06-13 MED ORDER — LIDOCAINE HCL (PF) 2 % IJ SOLN
INTRAMUSCULAR | Status: AC
  Administered 2017-06-13: 10 mL
  Filled 2017-06-13: qty 10

## 2017-06-13 MED ORDER — LIDOCAINE HCL (PF) 1 % IJ SOLN
INTRAMUSCULAR | Status: AC
  Administered 2017-06-13: 2 mL
  Filled 2017-06-13: qty 5

## 2017-06-13 NOTE — Discharge Instructions (Signed)
Transrectal Ultrasound-Guided Biopsy A transrectal ultrasound-guided biopsy is a procedure to remove samples of tissue from your prostate using ultrasound images to guide the procedure. The procedure is usually done to evaluate the prostate gland of men who have an elevated prostate-specific antigen (PSA). PSA is a blood test to screen for prostate cancer. The biopsy samples are taken to check for prostate cancer. Tell a health care provider about:  Any allergies you have.  All medicines you are taking, including vitamins, herbs, eye drops, creams, and over-the-counter medicines.  Any problems you or family members have had with anesthetic medicines.  Any blood disorders you have.  Any surgeries you have had.  Any medical conditions you have. What are the risks? Generally, this is a safe procedure. However, as with any procedure, problems can occur. Possible problems include:  Infection of your prostate.  Bleeding from your rectum or blood in your urine.  Difficulty urinating.  Nerve damage (this is usually temporary).  Damage to surrounding structures such as blood vessels, organs, and muscles, which would require other procedures.  What happens before the procedure?  Do not eat or drink anything after midnight on the night before the procedure or as directed by your health care provider.  Take medicines only as directed by your health care provider.  Your health care provider may have you stop taking certain medicines 5-7 days before the procedure.  You will be given an enema before the procedure. During an enema, a liquid is injected into your rectum to clear out waste.  You may have lab tests the day of your procedure.  Plan to have someone take you home after the procedure. What happens during the procedure?  You will be given medicine to help you relax (sedative) before the procedure. An IV tube will be inserted into one of your veins and used to give fluids and  medicine.  You will be given antibiotic medicine to reduce the risk of an infection.  You will be placed on your side for the procedure.  A probe with lubricated gel will be placed into your rectum, and images will be taken of your prostate and surrounding structures.  Numbing medicine will be injected into the prostate before the biopsy samples are taken.  A biopsy needle will then be inserted and guided to your prostate with the use of the ultrasound images.  Samples of prostate tissue will be taken, and the needle will then be removed.  The biopsy samples will be sent to a lab to be analyzed. Results are usually back in 2-3 days. What happens after the procedure?  You will be taken to a recovery area where you will be monitored.  You may have some discomfort in the rectal area. You will be given pain medicines to control this.  You may be allowed to go home the same day, or you may need to stay in the hospital overnight. This information is not intended to replace advice given to you by your health care provider. Make sure you discuss any questions you have with your health care provider. Document Released: 01/12/2014 Document Revised: 02/03/2016 Document Reviewed: 04/16/2013 Elsevier Interactive Patient Education  [employer].

## 2017-06-14 DIAGNOSIS — F1027 Alcohol dependence with alcohol-induced persisting dementia: Secondary | ICD-10-CM | POA: Diagnosis not present

## 2017-06-15 DIAGNOSIS — F1027 Alcohol dependence with alcohol-induced persisting dementia: Secondary | ICD-10-CM | POA: Diagnosis not present

## 2017-06-16 DIAGNOSIS — F1027 Alcohol dependence with alcohol-induced persisting dementia: Secondary | ICD-10-CM | POA: Diagnosis not present

## 2017-06-17 DIAGNOSIS — F1027 Alcohol dependence with alcohol-induced persisting dementia: Secondary | ICD-10-CM | POA: Diagnosis not present

## 2017-06-18 DIAGNOSIS — Z125 Encounter for screening for malignant neoplasm of prostate: Secondary | ICD-10-CM | POA: Diagnosis not present

## 2017-06-18 DIAGNOSIS — B182 Chronic viral hepatitis C: Secondary | ICD-10-CM | POA: Diagnosis not present

## 2017-06-18 DIAGNOSIS — F1027 Alcohol dependence with alcohol-induced persisting dementia: Secondary | ICD-10-CM | POA: Diagnosis not present

## 2017-06-18 DIAGNOSIS — F1010 Alcohol abuse, uncomplicated: Secondary | ICD-10-CM | POA: Diagnosis not present

## 2017-06-18 DIAGNOSIS — Z79899 Other long term (current) drug therapy: Secondary | ICD-10-CM | POA: Diagnosis not present

## 2017-06-18 DIAGNOSIS — M1990 Unspecified osteoarthritis, unspecified site: Secondary | ICD-10-CM | POA: Diagnosis not present

## 2017-06-19 DIAGNOSIS — M1990 Unspecified osteoarthritis, unspecified site: Secondary | ICD-10-CM | POA: Diagnosis not present

## 2017-06-19 DIAGNOSIS — F1027 Alcohol dependence with alcohol-induced persisting dementia: Secondary | ICD-10-CM | POA: Diagnosis not present

## 2017-06-19 DIAGNOSIS — K219 Gastro-esophageal reflux disease without esophagitis: Secondary | ICD-10-CM | POA: Diagnosis not present

## 2017-06-19 DIAGNOSIS — R972 Elevated prostate specific antigen [PSA]: Secondary | ICD-10-CM | POA: Diagnosis not present

## 2017-06-20 DIAGNOSIS — F1027 Alcohol dependence with alcohol-induced persisting dementia: Secondary | ICD-10-CM | POA: Diagnosis not present

## 2017-06-21 DIAGNOSIS — F1027 Alcohol dependence with alcohol-induced persisting dementia: Secondary | ICD-10-CM | POA: Diagnosis not present

## 2017-06-22 DIAGNOSIS — F1027 Alcohol dependence with alcohol-induced persisting dementia: Secondary | ICD-10-CM | POA: Diagnosis not present

## 2017-06-23 DIAGNOSIS — F1027 Alcohol dependence with alcohol-induced persisting dementia: Secondary | ICD-10-CM | POA: Diagnosis not present

## 2017-06-24 DIAGNOSIS — F1027 Alcohol dependence with alcohol-induced persisting dementia: Secondary | ICD-10-CM | POA: Diagnosis not present

## 2017-06-25 DIAGNOSIS — F1027 Alcohol dependence with alcohol-induced persisting dementia: Secondary | ICD-10-CM | POA: Diagnosis not present

## 2017-06-26 DIAGNOSIS — F1027 Alcohol dependence with alcohol-induced persisting dementia: Secondary | ICD-10-CM | POA: Diagnosis not present

## 2017-06-27 DIAGNOSIS — F1027 Alcohol dependence with alcohol-induced persisting dementia: Secondary | ICD-10-CM | POA: Diagnosis not present

## 2017-06-28 DIAGNOSIS — F1027 Alcohol dependence with alcohol-induced persisting dementia: Secondary | ICD-10-CM | POA: Diagnosis not present

## 2017-06-29 DIAGNOSIS — F1027 Alcohol dependence with alcohol-induced persisting dementia: Secondary | ICD-10-CM | POA: Diagnosis not present

## 2017-06-30 DIAGNOSIS — F1027 Alcohol dependence with alcohol-induced persisting dementia: Secondary | ICD-10-CM | POA: Diagnosis not present

## 2017-07-01 DIAGNOSIS — F1027 Alcohol dependence with alcohol-induced persisting dementia: Secondary | ICD-10-CM | POA: Diagnosis not present

## 2017-07-02 DIAGNOSIS — F1027 Alcohol dependence with alcohol-induced persisting dementia: Secondary | ICD-10-CM | POA: Diagnosis not present

## 2017-07-03 DIAGNOSIS — F1027 Alcohol dependence with alcohol-induced persisting dementia: Secondary | ICD-10-CM | POA: Diagnosis not present

## 2017-07-04 DIAGNOSIS — F1027 Alcohol dependence with alcohol-induced persisting dementia: Secondary | ICD-10-CM | POA: Diagnosis not present

## 2017-07-04 DIAGNOSIS — F17200 Nicotine dependence, unspecified, uncomplicated: Secondary | ICD-10-CM | POA: Diagnosis not present

## 2017-07-04 DIAGNOSIS — F1010 Alcohol abuse, uncomplicated: Secondary | ICD-10-CM | POA: Diagnosis not present

## 2017-07-05 DIAGNOSIS — F1027 Alcohol dependence with alcohol-induced persisting dementia: Secondary | ICD-10-CM | POA: Diagnosis not present

## 2017-07-06 DIAGNOSIS — F1027 Alcohol dependence with alcohol-induced persisting dementia: Secondary | ICD-10-CM | POA: Diagnosis not present

## 2017-07-07 DIAGNOSIS — F1027 Alcohol dependence with alcohol-induced persisting dementia: Secondary | ICD-10-CM | POA: Diagnosis not present

## 2017-07-08 DIAGNOSIS — F1027 Alcohol dependence with alcohol-induced persisting dementia: Secondary | ICD-10-CM | POA: Diagnosis not present

## 2017-07-09 DIAGNOSIS — F1027 Alcohol dependence with alcohol-induced persisting dementia: Secondary | ICD-10-CM | POA: Diagnosis not present

## 2017-07-10 DIAGNOSIS — F1027 Alcohol dependence with alcohol-induced persisting dementia: Secondary | ICD-10-CM | POA: Diagnosis not present

## 2017-07-11 ENCOUNTER — Ambulatory Visit: Payer: Medicare HMO | Admitting: Urology

## 2017-07-11 DIAGNOSIS — F1027 Alcohol dependence with alcohol-induced persisting dementia: Secondary | ICD-10-CM | POA: Diagnosis not present

## 2017-07-12 DIAGNOSIS — F1027 Alcohol dependence with alcohol-induced persisting dementia: Secondary | ICD-10-CM | POA: Diagnosis not present

## 2017-07-13 DIAGNOSIS — F1027 Alcohol dependence with alcohol-induced persisting dementia: Secondary | ICD-10-CM | POA: Diagnosis not present

## 2017-07-14 DIAGNOSIS — F1027 Alcohol dependence with alcohol-induced persisting dementia: Secondary | ICD-10-CM | POA: Diagnosis not present

## 2017-07-15 DIAGNOSIS — F1027 Alcohol dependence with alcohol-induced persisting dementia: Secondary | ICD-10-CM | POA: Diagnosis not present

## 2017-07-16 DIAGNOSIS — F1027 Alcohol dependence with alcohol-induced persisting dementia: Secondary | ICD-10-CM | POA: Diagnosis not present

## 2017-07-17 DIAGNOSIS — K219 Gastro-esophageal reflux disease without esophagitis: Secondary | ICD-10-CM | POA: Diagnosis not present

## 2017-07-17 DIAGNOSIS — M1990 Unspecified osteoarthritis, unspecified site: Secondary | ICD-10-CM | POA: Diagnosis not present

## 2017-07-17 DIAGNOSIS — R972 Elevated prostate specific antigen [PSA]: Secondary | ICD-10-CM | POA: Diagnosis not present

## 2017-07-17 DIAGNOSIS — F1027 Alcohol dependence with alcohol-induced persisting dementia: Secondary | ICD-10-CM | POA: Diagnosis not present

## 2017-07-18 DIAGNOSIS — F1027 Alcohol dependence with alcohol-induced persisting dementia: Secondary | ICD-10-CM | POA: Diagnosis not present

## 2017-07-19 DIAGNOSIS — F1027 Alcohol dependence with alcohol-induced persisting dementia: Secondary | ICD-10-CM | POA: Diagnosis not present

## 2017-07-20 DIAGNOSIS — F1027 Alcohol dependence with alcohol-induced persisting dementia: Secondary | ICD-10-CM | POA: Diagnosis not present

## 2017-07-21 DIAGNOSIS — F1027 Alcohol dependence with alcohol-induced persisting dementia: Secondary | ICD-10-CM | POA: Diagnosis not present

## 2017-07-22 DIAGNOSIS — F1027 Alcohol dependence with alcohol-induced persisting dementia: Secondary | ICD-10-CM | POA: Diagnosis not present

## 2017-07-23 DIAGNOSIS — F1027 Alcohol dependence with alcohol-induced persisting dementia: Secondary | ICD-10-CM | POA: Diagnosis not present

## 2017-07-24 DIAGNOSIS — F1027 Alcohol dependence with alcohol-induced persisting dementia: Secondary | ICD-10-CM | POA: Diagnosis not present

## 2017-07-25 DIAGNOSIS — F1027 Alcohol dependence with alcohol-induced persisting dementia: Secondary | ICD-10-CM | POA: Diagnosis not present

## 2017-07-26 DIAGNOSIS — F1027 Alcohol dependence with alcohol-induced persisting dementia: Secondary | ICD-10-CM | POA: Diagnosis not present

## 2017-07-27 DIAGNOSIS — F1027 Alcohol dependence with alcohol-induced persisting dementia: Secondary | ICD-10-CM | POA: Diagnosis not present

## 2017-07-28 DIAGNOSIS — F1027 Alcohol dependence with alcohol-induced persisting dementia: Secondary | ICD-10-CM | POA: Diagnosis not present

## 2017-07-29 DIAGNOSIS — F1027 Alcohol dependence with alcohol-induced persisting dementia: Secondary | ICD-10-CM | POA: Diagnosis not present

## 2017-07-30 DIAGNOSIS — F1027 Alcohol dependence with alcohol-induced persisting dementia: Secondary | ICD-10-CM | POA: Diagnosis not present

## 2017-07-31 DIAGNOSIS — F1027 Alcohol dependence with alcohol-induced persisting dementia: Secondary | ICD-10-CM | POA: Diagnosis not present

## 2017-07-31 DIAGNOSIS — F17200 Nicotine dependence, unspecified, uncomplicated: Secondary | ICD-10-CM | POA: Diagnosis not present

## 2017-08-01 DIAGNOSIS — F1027 Alcohol dependence with alcohol-induced persisting dementia: Secondary | ICD-10-CM | POA: Diagnosis not present

## 2017-08-02 DIAGNOSIS — F1027 Alcohol dependence with alcohol-induced persisting dementia: Secondary | ICD-10-CM | POA: Diagnosis not present

## 2017-08-03 DIAGNOSIS — F1027 Alcohol dependence with alcohol-induced persisting dementia: Secondary | ICD-10-CM | POA: Diagnosis not present

## 2017-08-04 DIAGNOSIS — F1027 Alcohol dependence with alcohol-induced persisting dementia: Secondary | ICD-10-CM | POA: Diagnosis not present

## 2017-08-05 DIAGNOSIS — F1027 Alcohol dependence with alcohol-induced persisting dementia: Secondary | ICD-10-CM | POA: Diagnosis not present

## 2017-08-06 DIAGNOSIS — F1027 Alcohol dependence with alcohol-induced persisting dementia: Secondary | ICD-10-CM | POA: Diagnosis not present

## 2017-08-07 DIAGNOSIS — F1027 Alcohol dependence with alcohol-induced persisting dementia: Secondary | ICD-10-CM | POA: Diagnosis not present

## 2017-08-08 ENCOUNTER — Ambulatory Visit: Payer: Medicare HMO | Admitting: Urology

## 2017-08-08 DIAGNOSIS — F1027 Alcohol dependence with alcohol-induced persisting dementia: Secondary | ICD-10-CM | POA: Diagnosis not present

## 2017-08-09 DIAGNOSIS — F1027 Alcohol dependence with alcohol-induced persisting dementia: Secondary | ICD-10-CM | POA: Diagnosis not present

## 2017-08-10 DIAGNOSIS — F1027 Alcohol dependence with alcohol-induced persisting dementia: Secondary | ICD-10-CM | POA: Diagnosis not present

## 2017-08-11 DIAGNOSIS — F1027 Alcohol dependence with alcohol-induced persisting dementia: Secondary | ICD-10-CM | POA: Diagnosis not present

## 2017-08-16 DIAGNOSIS — F1027 Alcohol dependence with alcohol-induced persisting dementia: Secondary | ICD-10-CM | POA: Diagnosis not present

## 2017-08-17 DIAGNOSIS — F1027 Alcohol dependence with alcohol-induced persisting dementia: Secondary | ICD-10-CM | POA: Diagnosis not present

## 2017-08-18 DIAGNOSIS — F1027 Alcohol dependence with alcohol-induced persisting dementia: Secondary | ICD-10-CM | POA: Diagnosis not present

## 2017-08-19 DIAGNOSIS — F1027 Alcohol dependence with alcohol-induced persisting dementia: Secondary | ICD-10-CM | POA: Diagnosis not present

## 2017-08-20 DIAGNOSIS — F1027 Alcohol dependence with alcohol-induced persisting dementia: Secondary | ICD-10-CM | POA: Diagnosis not present

## 2017-08-21 DIAGNOSIS — F1027 Alcohol dependence with alcohol-induced persisting dementia: Secondary | ICD-10-CM | POA: Diagnosis not present

## 2017-08-22 ENCOUNTER — Ambulatory Visit: Payer: Medicare HMO | Admitting: Urology

## 2017-08-22 DIAGNOSIS — F1027 Alcohol dependence with alcohol-induced persisting dementia: Secondary | ICD-10-CM | POA: Diagnosis not present

## 2017-08-23 DIAGNOSIS — F1027 Alcohol dependence with alcohol-induced persisting dementia: Secondary | ICD-10-CM | POA: Diagnosis not present

## 2017-08-24 DIAGNOSIS — F1027 Alcohol dependence with alcohol-induced persisting dementia: Secondary | ICD-10-CM | POA: Diagnosis not present

## 2017-08-25 DIAGNOSIS — F1027 Alcohol dependence with alcohol-induced persisting dementia: Secondary | ICD-10-CM | POA: Diagnosis not present

## 2017-08-26 DIAGNOSIS — F1027 Alcohol dependence with alcohol-induced persisting dementia: Secondary | ICD-10-CM | POA: Diagnosis not present

## 2017-08-27 DIAGNOSIS — F1027 Alcohol dependence with alcohol-induced persisting dementia: Secondary | ICD-10-CM | POA: Diagnosis not present

## 2017-08-28 DIAGNOSIS — F1027 Alcohol dependence with alcohol-induced persisting dementia: Secondary | ICD-10-CM | POA: Diagnosis not present

## 2017-08-29 DIAGNOSIS — F1027 Alcohol dependence with alcohol-induced persisting dementia: Secondary | ICD-10-CM | POA: Diagnosis not present

## 2017-08-30 DIAGNOSIS — F1027 Alcohol dependence with alcohol-induced persisting dementia: Secondary | ICD-10-CM | POA: Diagnosis not present

## 2017-08-31 DIAGNOSIS — F1010 Alcohol abuse, uncomplicated: Secondary | ICD-10-CM | POA: Diagnosis not present

## 2017-08-31 DIAGNOSIS — Z79899 Other long term (current) drug therapy: Secondary | ICD-10-CM | POA: Diagnosis not present

## 2017-08-31 DIAGNOSIS — F1027 Alcohol dependence with alcohol-induced persisting dementia: Secondary | ICD-10-CM | POA: Diagnosis not present

## 2017-08-31 DIAGNOSIS — B182 Chronic viral hepatitis C: Secondary | ICD-10-CM | POA: Diagnosis not present

## 2017-08-31 DIAGNOSIS — E559 Vitamin D deficiency, unspecified: Secondary | ICD-10-CM | POA: Diagnosis not present

## 2017-08-31 DIAGNOSIS — M1990 Unspecified osteoarthritis, unspecified site: Secondary | ICD-10-CM | POA: Diagnosis not present

## 2017-09-01 DIAGNOSIS — F1027 Alcohol dependence with alcohol-induced persisting dementia: Secondary | ICD-10-CM | POA: Diagnosis not present

## 2017-09-06 DIAGNOSIS — F1027 Alcohol dependence with alcohol-induced persisting dementia: Secondary | ICD-10-CM | POA: Diagnosis not present

## 2017-09-07 DIAGNOSIS — F1027 Alcohol dependence with alcohol-induced persisting dementia: Secondary | ICD-10-CM | POA: Diagnosis not present

## 2017-09-08 DIAGNOSIS — F1027 Alcohol dependence with alcohol-induced persisting dementia: Secondary | ICD-10-CM | POA: Diagnosis not present

## 2017-09-09 DIAGNOSIS — F1027 Alcohol dependence with alcohol-induced persisting dementia: Secondary | ICD-10-CM | POA: Diagnosis not present

## 2017-09-10 DIAGNOSIS — F1027 Alcohol dependence with alcohol-induced persisting dementia: Secondary | ICD-10-CM | POA: Diagnosis not present

## 2017-09-11 DIAGNOSIS — F1027 Alcohol dependence with alcohol-induced persisting dementia: Secondary | ICD-10-CM | POA: Diagnosis not present

## 2017-09-12 DIAGNOSIS — F1027 Alcohol dependence with alcohol-induced persisting dementia: Secondary | ICD-10-CM | POA: Diagnosis not present

## 2017-09-13 DIAGNOSIS — R972 Elevated prostate specific antigen [PSA]: Secondary | ICD-10-CM | POA: Diagnosis not present

## 2017-09-13 DIAGNOSIS — M1990 Unspecified osteoarthritis, unspecified site: Secondary | ICD-10-CM | POA: Diagnosis not present

## 2017-09-13 DIAGNOSIS — F1027 Alcohol dependence with alcohol-induced persisting dementia: Secondary | ICD-10-CM | POA: Diagnosis not present

## 2017-09-13 DIAGNOSIS — F17200 Nicotine dependence, unspecified, uncomplicated: Secondary | ICD-10-CM | POA: Diagnosis not present

## 2017-09-14 DIAGNOSIS — F1027 Alcohol dependence with alcohol-induced persisting dementia: Secondary | ICD-10-CM | POA: Diagnosis not present

## 2017-09-15 DIAGNOSIS — F1027 Alcohol dependence with alcohol-induced persisting dementia: Secondary | ICD-10-CM | POA: Diagnosis not present

## 2017-09-16 DIAGNOSIS — F1027 Alcohol dependence with alcohol-induced persisting dementia: Secondary | ICD-10-CM | POA: Diagnosis not present

## 2017-09-17 DIAGNOSIS — F1027 Alcohol dependence with alcohol-induced persisting dementia: Secondary | ICD-10-CM | POA: Diagnosis not present

## 2017-09-18 DIAGNOSIS — F1027 Alcohol dependence with alcohol-induced persisting dementia: Secondary | ICD-10-CM | POA: Diagnosis not present

## 2017-09-19 ENCOUNTER — Ambulatory Visit (INDEPENDENT_AMBULATORY_CARE_PROVIDER_SITE_OTHER): Payer: Medicare HMO | Admitting: Urology

## 2017-09-19 DIAGNOSIS — C61 Malignant neoplasm of prostate: Secondary | ICD-10-CM

## 2017-09-19 DIAGNOSIS — F1027 Alcohol dependence with alcohol-induced persisting dementia: Secondary | ICD-10-CM | POA: Diagnosis not present

## 2017-09-20 DIAGNOSIS — F1027 Alcohol dependence with alcohol-induced persisting dementia: Secondary | ICD-10-CM | POA: Diagnosis not present

## 2017-09-21 ENCOUNTER — Other Ambulatory Visit: Payer: Self-pay | Admitting: Urology

## 2017-09-21 DIAGNOSIS — C61 Malignant neoplasm of prostate: Secondary | ICD-10-CM

## 2017-09-21 DIAGNOSIS — F1027 Alcohol dependence with alcohol-induced persisting dementia: Secondary | ICD-10-CM | POA: Diagnosis not present

## 2017-09-22 DIAGNOSIS — F1027 Alcohol dependence with alcohol-induced persisting dementia: Secondary | ICD-10-CM | POA: Diagnosis not present

## 2017-09-23 DIAGNOSIS — F1027 Alcohol dependence with alcohol-induced persisting dementia: Secondary | ICD-10-CM | POA: Diagnosis not present

## 2017-09-24 DIAGNOSIS — F1027 Alcohol dependence with alcohol-induced persisting dementia: Secondary | ICD-10-CM | POA: Diagnosis not present

## 2017-09-25 DIAGNOSIS — F1027 Alcohol dependence with alcohol-induced persisting dementia: Secondary | ICD-10-CM | POA: Diagnosis not present

## 2017-09-26 ENCOUNTER — Encounter (HOSPITAL_COMMUNITY)
Admission: RE | Admit: 2017-09-26 | Discharge: 2017-09-26 | Disposition: A | Discharge status: Home / Self Care | Payer: Medicare HMO | Source: Ambulatory Visit | Attending: Urology | Admitting: Urology

## 2017-09-26 ENCOUNTER — Encounter (HOSPITAL_COMMUNITY): Payer: Self-pay

## 2017-09-26 DIAGNOSIS — C61 Malignant neoplasm of prostate: Secondary | ICD-10-CM | POA: Insufficient documentation

## 2017-09-26 DIAGNOSIS — F1027 Alcohol dependence with alcohol-induced persisting dementia: Secondary | ICD-10-CM | POA: Diagnosis not present

## 2017-09-26 MED ORDER — TECHNETIUM TC 99M MEDRONATE IV KIT
20.0000 | PACK | Freq: Once | INTRAVENOUS | Status: AC | PRN
  Administered 2017-09-26: 20 via INTRAVENOUS

## 2017-09-27 DIAGNOSIS — C61 Malignant neoplasm of prostate: Secondary | ICD-10-CM | POA: Diagnosis not present

## 2017-09-27 DIAGNOSIS — F1027 Alcohol dependence with alcohol-induced persisting dementia: Secondary | ICD-10-CM | POA: Diagnosis not present

## 2017-09-27 DIAGNOSIS — F17200 Nicotine dependence, unspecified, uncomplicated: Secondary | ICD-10-CM | POA: Diagnosis not present

## 2017-09-27 DIAGNOSIS — F1010 Alcohol abuse, uncomplicated: Secondary | ICD-10-CM | POA: Diagnosis not present

## 2017-09-28 DIAGNOSIS — F1027 Alcohol dependence with alcohol-induced persisting dementia: Secondary | ICD-10-CM | POA: Diagnosis not present

## 2017-09-29 DIAGNOSIS — F1027 Alcohol dependence with alcohol-induced persisting dementia: Secondary | ICD-10-CM | POA: Diagnosis not present

## 2017-09-30 DIAGNOSIS — F1027 Alcohol dependence with alcohol-induced persisting dementia: Secondary | ICD-10-CM | POA: Diagnosis not present

## 2017-10-01 DIAGNOSIS — F1027 Alcohol dependence with alcohol-induced persisting dementia: Secondary | ICD-10-CM | POA: Diagnosis not present

## 2017-10-02 DIAGNOSIS — F1027 Alcohol dependence with alcohol-induced persisting dementia: Secondary | ICD-10-CM | POA: Diagnosis not present

## 2017-10-03 ENCOUNTER — Other Ambulatory Visit: Payer: Self-pay

## 2017-10-03 ENCOUNTER — Ambulatory Visit
Admission: RE | Admit: 2017-10-03 | Discharge: 2017-10-03 | Disposition: A | Discharge status: Home / Self Care | Payer: Medicare HMO | Source: Ambulatory Visit | Attending: Radiation Oncology | Admitting: Radiation Oncology

## 2017-10-03 ENCOUNTER — Encounter: Payer: Self-pay | Admitting: Radiation Oncology

## 2017-10-03 VITALS — BP 128/84 | HR 59 | Temp 98.0°F | Resp 16 | Wt 125.8 lb

## 2017-10-03 DIAGNOSIS — Z9079 Acquired absence of other genital organ(s): Secondary | ICD-10-CM | POA: Diagnosis not present

## 2017-10-03 DIAGNOSIS — K219 Gastro-esophageal reflux disease without esophagitis: Secondary | ICD-10-CM | POA: Insufficient documentation

## 2017-10-03 DIAGNOSIS — F0390 Unspecified dementia without behavioral disturbance: Secondary | ICD-10-CM | POA: Diagnosis not present

## 2017-10-03 DIAGNOSIS — Z79899 Other long term (current) drug therapy: Secondary | ICD-10-CM | POA: Insufficient documentation

## 2017-10-03 DIAGNOSIS — G629 Polyneuropathy, unspecified: Secondary | ICD-10-CM | POA: Insufficient documentation

## 2017-10-03 DIAGNOSIS — F0280 Dementia in other diseases classified elsewhere without behavioral disturbance: Secondary | ICD-10-CM | POA: Insufficient documentation

## 2017-10-03 DIAGNOSIS — G308 Other Alzheimer's disease: Secondary | ICD-10-CM | POA: Diagnosis not present

## 2017-10-03 DIAGNOSIS — C61 Malignant neoplasm of prostate: Secondary | ICD-10-CM

## 2017-10-03 DIAGNOSIS — F1027 Alcohol dependence with alcohol-induced persisting dementia: Secondary | ICD-10-CM | POA: Diagnosis not present

## 2017-10-03 DIAGNOSIS — Z8719 Personal history of other diseases of the digestive system: Secondary | ICD-10-CM | POA: Diagnosis not present

## 2017-10-03 DIAGNOSIS — F17210 Nicotine dependence, cigarettes, uncomplicated: Secondary | ICD-10-CM | POA: Insufficient documentation

## 2017-10-03 DIAGNOSIS — R972 Elevated prostate specific antigen [PSA]: Secondary | ICD-10-CM | POA: Diagnosis not present

## 2017-10-03 DIAGNOSIS — G300 Alzheimer's disease with early onset: Secondary | ICD-10-CM | POA: Diagnosis not present

## 2017-10-03 HISTORY — DX: Malignant neoplasm of prostate: C61

## 2017-10-03 NOTE — Progress Notes (Signed)
GU Location of Tumor / Histology: prostatic adenocarcinoma  If Prostate Cancer, Gleason Score is (3 + 4) and PSA is (17.5). Prostate volume: 21.7 grams.  Zachary Gilmore reports that the doctor who visits the rest home told him six months ago his PSA was elevated and referred him to Dr. Alyson Ingles. Reports his father and brother have a hx of prostate ca.   Biopsies of prostate (if applicable) revealed:    Past/Anticipated interventions by urology, if any: prostate biopsy, bone scan (negative), CT abdomen pelvis (not done; no insurance approval), referral to Dr. Tammi Klippel to discuss IMRT vs brachytherapy  Past/Anticipated interventions by medical oncology, if any: no  Weight changes, if any: 15% weight loss; down from 148 lb one year ago to 125.8 lb today  Bowel/Bladder complaints, if any: IPSS 4 with frequency. Denies dysuria or hematuria. Patient denies leakage or incontinence however, front a jeans appear to be wet with urine. Reports he hasn't been sexually active in six months but denies ED.  Nausea/Vomiting, if any: no  Pain issues, if any:  no  SAFETY ISSUES:  Prior radiation? no  Pacemaker/ICD? no  Possible current pregnancy? no  Is the patient on methotrexate? no  Current Complaints / other details:  62 year old male. Has lived at [facility] for 22 plus years. [facility] is in [place] close to [place], [place]. Single. Smoker. Father with hx of prostate cancer.

## 2017-10-03 NOTE — Progress Notes (Signed)
Radiation Oncology         [phone] ________________________________  Initial Outpatient Consultation  Name: Zachary Gilmore MRN: [id number]  Date: 10/03/2017  DOB: 20-Mar-1956  PJ:[id number], Zachary Hakim, FNP  McKenzie, Candee Furbish, MD   REFERRING PHYSICIAN: Cleon Gustin, MD  DIAGNOSIS: The encounter diagnosis was Malignant neoplasm of prostate [facility]).    ICD-10-CM   1. Malignant neoplasm of prostate ([place]) C61     HISTORY OF PRESENT ILLNESS: Zachary Gilmore is a 62 y.o. male with a new diagnosis of prostate cancer. He has a history of TURP at [facility] several years ago, by record. He was noted to have an elevated PSA of 17.5 on 02/20/2017 by the physician who visits his rest home. Accordingly, he was referred for evaluation in urology to Dr. Alyson Ingles on 04/24/2017, where a digital rectal examination was performed at that time revealing no prostate nodule.  The patient proceeded to transrectal ultrasound with 12 biopsies of the prostate on 06/13/2017.  The prostate volume measured 21.7 cc.  Out of 12 core biopsies, 8 were positive.  The maximum Gleason score was 3+4, and this was seen in the right base, right mid, and right base lateral.  A follow up PSA on 06/18/2017 was further elevated at 21.4, thus Dr. Alyson Ingles ordered CT and bone scans for disease staging. The patient's bone scan from 09/26/2017 showed no evidence of osseous metastatic disease. His CT abdomen/pelvis remains pending while awaiting insurance approval.   Biopsies of the prostate revealed:   The patient reviewed the biopsy results with his urologist and he has kindly been referred today for discussion of potential radiation treatment options. He is adamantly not interested in prostatectomy.  He is accompanied by his caretaker/transportation from his rest home. His rest home facility is in [place], [place] where he has been a resident for the past 20-30 years. He has some early onset Alzheimer's dementia and takes Aricept  daily.   PREVIOUS RADIATION THERAPY: No  PAST MEDICAL HISTORY:  Past Medical History:  Diagnosis Date  . Alcohol abuse   . Colorectal cancer ([place])   . GERD (gastroesophageal reflux disease)   . Hepatitis   . History of substance abuse   . Neuropathy   . Osteoarthritis   . Prostate cancer ([place])   . Seizures ([place])    as a child  . Shoulder pain   . Tobacco use       PAST SURGICAL HISTORY: Past Surgical History:  Procedure Laterality Date  . CARDIAC CATHETERIZATION N/A 10/03/2016   Procedure: Left Heart Cath and Coronary Angiography;  Surgeon: Corey Skains, MD;  Location: [place] [facility];  Service: Cardiovascular;  Laterality: N/A;  . COLON SURGERY    . colorectal cancer     "scraped off"   . PROSTATE BIOPSY    . SHOULDER ARTHROSCOPY WITH BICEPS TENDON REPAIR Right 10/03/2016   Procedure: SHOULDER ARTHROSCOPIC LIMITED DEBRIDEMENT  WITH BICEPS TENOLYSIS;  Surgeon: Corky Mull, MD;  Location: [facility] ORS;  Service: Orthopedics;  Laterality: Right;  . Stomach ulcers      FAMILY HISTORY:  Family History  Problem Relation Age of Onset  . Heart attack Mother   . Colon cancer Father   . Cancer Father        prostate  . Cancer Brother        prostate    SOCIAL HISTORY:  Social History   Socioeconomic History  . Marital status: Single    Spouse name: Not on  file  . Number of children: Not on file  . Years of education: Not on file  . Highest education level: Not on file  Social Needs  . Financial resource strain: Not on file  . Food insecurity - worry: Not on file  . Food insecurity - inability: Not on file  . Transportation needs - medical: Not on file  . Transportation needs - non-medical: Not on file  Occupational History    Comment: unemployed  Tobacco Use  . Smoking status: Current Every Day Smoker    Packs/day: 0.50    Years: 45.00    Pack years: 22.50    Types: Cigarettes  . Smokeless tobacco: Never Used  Substance and Sexual Activity  . Alcohol  use: Yes    Comment: occasional glass of wine  . Drug use: Yes    Types: Marijuana  . Sexual activity: No  Other Topics Concern  . Not on file  Social History Narrative   Lives at [facility] in [place] close to [place]. Reports living at [facility] for [facility] plus years.     ALLERGIES: Patient has no known allergies.  MEDICATIONS:  Current Outpatient Medications  Medication Sig Dispense Refill  . acetaminophen (TYLENOL) 500 MG tablet Take 500 mg by mouth every 6 (six) hours as needed for mild pain.     . Cholecalciferol (D 5000) 5000 units capsule Take 5,000 Units by mouth daily.    [name] donepezil (ARICEPT) 10 MG tablet Take 10 mg by mouth at bedtime.     . naltrexone (DEPADE) 50 MG tablet Take 50 mg by mouth daily.    . naproxen (NAPROSYN) 250 MG tablet Take by mouth 2 (two) times daily with a meal.    . ranitidine (ZANTAC) 150 MG capsule Take 150 mg by mouth 2 (two) times daily.    [name] oxyCODONE (ROXICODONE) 5 MG immediate release tablet Take 1-2 tablets (5-10 mg total) by mouth every 4 (four) hours as needed for severe pain. (Patient not taking: Reported on 10/03/2017) 40 tablet 0   No current facility-administered medications for this encounter.     REVIEW OF SYSTEMS:  On review of systems, the patient reports that he is doing well overall. He denies any chest pain, shortness of breath, cough, fevers, chills, or night sweats. He reports a 23-pound weight loss over the past year. He denies any bowel disturbances, and denies abdominal pain, nausea or vomiting. He denies any new musculoskeletal or joint aches or pains. His IPSS was 4, indicating mild urinary symptoms with frequency. He denies any dysuria or hematuria. He reports he is not currently sexually active but denies erectile dysfunction. A complete review of systems is obtained and is otherwise negative.  PHYSICAL EXAM:  Wt Readings from Last 3 Encounters:  10/03/17 125 lb 12.8 oz (57.1 kg)  09/26/16 148 lb (67.1 kg)  05/25/16  129 lb (58.5 kg)   Temp Readings from Last 3 Encounters:  10/03/17 98 F (36.7 C) (Oral)  06/13/17 98 F (36.7 C) (Oral)  10/03/16 98 F (36.7 C)   BP Readings from Last 3 Encounters:  10/03/17 128/84  06/13/17 114/72  10/03/16 (!) 170/93   Pulse Readings from Last 3 Encounters:  10/03/17 (!) 59  06/13/17 68  10/03/16 (!) 48   Pain Assessment Pain Score: 0-No pain/10  In general this is a well appearing Caucasian man in no acute distress. He is alert and oriented x4 and appropriate throughout the examination. HEENT reveals that the patient is  normocephalic, atraumatic. Skin is intact without any evidence of gross lesions. Cardiovascular exam reveals a regular rate and rhythm, no clicks rubs or murmurs are auscultated. Chest is clear to auscultation bilaterally. Lymphatic assessment is performed and does not reveal any adenopathy in the cervical, supraclavicular, axillary, or inguinal chains. Abdomen has active bowel sounds in all quadrants and is intact. The abdomen is soft, non tender, non distended. Lower extremities are negative for pretibial pitting edema, deep calf tenderness, cyanosis or clubbing.   KPS = 90  100 - Normal; no complaints; no evidence of disease. 90   - Able to carry on normal activity; minor signs or symptoms of disease. 80   - Normal activity with effort; some signs or symptoms of disease. 42   - Cares for self; unable to carry on normal activity or to do active work. 60   - Requires occasional assistance, but is able to care for most of his personal needs. 50   - Requires considerable assistance and frequent medical care. 16   - Disabled; requires special care and assistance. 26   - Severely disabled; hospital admission is indicated although death not imminent. 2   - Very sick; hospital admission necessary; active supportive treatment necessary. 10   - Moribund; fatal processes progressing rapidly. 0     - Dead  Karnofsky DA, Abelmann Keenes, Craver LS and  Burchenal [facility] [phone]) The use of the nitrogen mustards in the palliative treatment of carcinoma: with particular reference to bronchogenic carcinoma Cancer 1 634-56  LABORATORY DATA:  Lab Results  Component Value Date   WBC 9.9 09/26/2016   HGB 16.9 09/26/2016   HCT 48.9 09/26/2016   MCV 91.9 09/26/2016   PLT 263 09/26/2016   Lab Results  Component Value Date   NA 138 09/26/2016   K 4.1 09/26/2016   CL 100 (L) 09/26/2016   CO2 33 (H) 09/26/2016   Lab Results  Component Value Date   ALT 13 05/25/2016   AST 22 05/25/2016   ALKPHOS 106 05/25/2016   BILITOT 0.5 05/25/2016     RADIOGRAPHY: Nm Bone Scan Whole Body  Result Date: 09/26/2017 CLINICAL DATA:  Prostate cancer, history of arthritis in the hands and feet, prior RIGHT shoulder dislocation 2 years ago EXAM: [place]: Whole body anterior and posterior images were obtained approximately 3 hours after intravenous injection of radiopharmaceutical. RADIOPHARMACEUTICALS:  20 mCi Technetium-66m MDP IV COMPARISON:  None Radiographic correlation: None FINDINGS: Increased tracer localization at the shoulders and knees bilaterally typically degenerative. No additional abnormal sites of osseous tracer accumulation are identified which are concerning for osseous metastatic disease. Expected urinary tract and soft tissue distribution of tracer. IMPRESSION: No scintigraphic evidence of osseous metastatic disease. Electronically Signed   By: Lavonia Dana M.D.   On: 09/26/2017 18:22      IMPRESSION/PLAN: 1. 62 y.o. gentleman with Stage T1c adenocarcinoma of the prostate with Gleason Score of 3+4, and PSA of 21.4. We discussed the patient's workup and outlines the nature of prostate cancer in this setting. The patient's T stage, Gleason's score, and PSA put him into the high risk group. Accordingly, he is eligible for a variety of potential treatment options including ADT in combination with either brachytherapy or  8 weeks of external radiation or prostatectomy. He is adamantly not interested in prostatectomy. He is not a brachytherapy candidate given his history of previous TURP procedure.  We discussed the available radiation techniques, and focused on the details  of logistics and delivery. We discussed and outlined the risks, benefits, short and long-term effects associated with radiotherapy and compared and contrasted these with prostatectomy. The recommendation is for a 5.5 - 8 week course of external beam radiotherapy.  We discussed the role of SpaceOAR in reducing the rectal toxicity associated with radiotherapy. We also detailed the role of ADT in the treatment of high-risk prostate cancer and outlined the associated side effects that could be expected with this therapy.   At the conclusion of our conversation, the patient is interested in moving forward with prostate IMRT located closer to home. CT A/P results remain pending to complete his disease staging.  We will make a referral to the radiation oncology clinic at the [facility]. He will follow up with Dr. Alyson Ingles to further discuss the role of ADT in combination with radiotherapy in the treatment of high risk prostate cancer. The patient was encouraged to call or return if he has any additional questions or concerns.  We spent more than 50% of today's time face to face with the patient in counseling and/or coordination of care.    Nicholos Johns, PA-C    Tyler Pita, MD  [facility] Direct Dial: [phone]  Fax: [phone] [email or web address]  Skype  [email or web address]  This document serves as a record of services personally performed by Tyler Pita, MD and Freeman Caldron, PA-C. It was created on their behalf by Rae Lips, a trained medical scribe. The creation of this record is based on the scribe's personal observations and the providers' statements to them. This document has been checked and  approved by the attending providers.

## 2017-10-03 NOTE — Progress Notes (Signed)
See progress note under physician encounter. 

## 2017-10-04 DIAGNOSIS — F1027 Alcohol dependence with alcohol-induced persisting dementia: Secondary | ICD-10-CM | POA: Diagnosis not present

## 2017-10-05 DIAGNOSIS — F1027 Alcohol dependence with alcohol-induced persisting dementia: Secondary | ICD-10-CM | POA: Diagnosis not present

## 2017-10-06 DIAGNOSIS — F1027 Alcohol dependence with alcohol-induced persisting dementia: Secondary | ICD-10-CM | POA: Diagnosis not present

## 2017-10-07 DIAGNOSIS — F1027 Alcohol dependence with alcohol-induced persisting dementia: Secondary | ICD-10-CM | POA: Diagnosis not present

## 2017-10-08 DIAGNOSIS — F1027 Alcohol dependence with alcohol-induced persisting dementia: Secondary | ICD-10-CM | POA: Diagnosis not present

## 2017-10-09 DIAGNOSIS — F1027 Alcohol dependence with alcohol-induced persisting dementia: Secondary | ICD-10-CM | POA: Diagnosis not present

## 2017-10-10 DIAGNOSIS — F1027 Alcohol dependence with alcohol-induced persisting dementia: Secondary | ICD-10-CM | POA: Diagnosis not present

## 2017-10-11 DIAGNOSIS — F1027 Alcohol dependence with alcohol-induced persisting dementia: Secondary | ICD-10-CM | POA: Diagnosis not present

## 2017-10-12 ENCOUNTER — Telehealth: Payer: Self-pay | Admitting: Medical Oncology

## 2017-10-12 DIAGNOSIS — F1027 Alcohol dependence with alcohol-induced persisting dementia: Secondary | ICD-10-CM | POA: Diagnosis not present

## 2017-10-12 NOTE — Telephone Encounter (Signed)
I called [facility] to follow up with Mr. Zachary Gilmore. He was seen by Dr. Tammi Klippel 10/03/17 for prostate cancer. He is scheduled for CT 10/19/17 and will follow up with Dr. Alyson Ingles 10/17/17  for discussion of ADT and radiation in [place]. A referral was placed for [facility]. Patient has not been contacted with consult appointment.

## 2017-10-13 DIAGNOSIS — F1027 Alcohol dependence with alcohol-induced persisting dementia: Secondary | ICD-10-CM | POA: Diagnosis not present

## 2017-10-14 DIAGNOSIS — F1027 Alcohol dependence with alcohol-induced persisting dementia: Secondary | ICD-10-CM | POA: Diagnosis not present

## 2017-10-15 DIAGNOSIS — F17290 Nicotine dependence, other tobacco product, uncomplicated: Secondary | ICD-10-CM | POA: Diagnosis not present

## 2017-10-15 DIAGNOSIS — F17200 Nicotine dependence, unspecified, uncomplicated: Secondary | ICD-10-CM | POA: Diagnosis not present

## 2017-10-15 DIAGNOSIS — B182 Chronic viral hepatitis C: Secondary | ICD-10-CM | POA: Diagnosis not present

## 2017-10-15 DIAGNOSIS — M1990 Unspecified osteoarthritis, unspecified site: Secondary | ICD-10-CM | POA: Diagnosis not present

## 2017-10-15 DIAGNOSIS — Z79899 Other long term (current) drug therapy: Secondary | ICD-10-CM | POA: Diagnosis not present

## 2017-10-15 DIAGNOSIS — F1010 Alcohol abuse, uncomplicated: Secondary | ICD-10-CM | POA: Diagnosis not present

## 2017-10-15 DIAGNOSIS — F1027 Alcohol dependence with alcohol-induced persisting dementia: Secondary | ICD-10-CM | POA: Diagnosis not present

## 2017-10-15 DIAGNOSIS — C61 Malignant neoplasm of prostate: Secondary | ICD-10-CM | POA: Diagnosis not present

## 2017-10-16 DIAGNOSIS — F1027 Alcohol dependence with alcohol-induced persisting dementia: Secondary | ICD-10-CM | POA: Diagnosis not present

## 2017-10-17 ENCOUNTER — Ambulatory Visit (INDEPENDENT_AMBULATORY_CARE_PROVIDER_SITE_OTHER): Payer: Medicare HMO | Admitting: Urology

## 2017-10-17 DIAGNOSIS — F1027 Alcohol dependence with alcohol-induced persisting dementia: Secondary | ICD-10-CM | POA: Diagnosis not present

## 2017-10-17 DIAGNOSIS — C61 Malignant neoplasm of prostate: Secondary | ICD-10-CM | POA: Diagnosis not present

## 2017-10-18 DIAGNOSIS — Z87828 Personal history of other (healed) physical injury and trauma: Secondary | ICD-10-CM | POA: Diagnosis not present

## 2017-10-18 DIAGNOSIS — F1027 Alcohol dependence with alcohol-induced persisting dementia: Secondary | ICD-10-CM | POA: Diagnosis not present

## 2017-10-18 DIAGNOSIS — F17200 Nicotine dependence, unspecified, uncomplicated: Secondary | ICD-10-CM | POA: Diagnosis not present

## 2017-10-18 DIAGNOSIS — Z800 Family history of malignant neoplasm of digestive organs: Secondary | ICD-10-CM | POA: Diagnosis not present

## 2017-10-18 DIAGNOSIS — C61 Malignant neoplasm of prostate: Secondary | ICD-10-CM | POA: Diagnosis not present

## 2017-10-19 ENCOUNTER — Ambulatory Visit (HOSPITAL_COMMUNITY)
Admission: RE | Admit: 2017-10-19 | Discharge: 2017-10-19 | Disposition: A | Discharge status: Home / Self Care | Payer: Medicare HMO | Source: Ambulatory Visit | Attending: Urology | Admitting: Urology

## 2017-10-19 DIAGNOSIS — C61 Malignant neoplasm of prostate: Secondary | ICD-10-CM | POA: Diagnosis not present

## 2017-10-19 DIAGNOSIS — R937 Abnormal findings on diagnostic imaging of other parts of musculoskeletal system: Secondary | ICD-10-CM | POA: Diagnosis not present

## 2017-10-19 DIAGNOSIS — F1027 Alcohol dependence with alcohol-induced persisting dementia: Secondary | ICD-10-CM | POA: Diagnosis not present

## 2017-10-19 LAB — POCT I-STAT CREATININE: Creatinine, Ser: 1 mg/dL (ref 0.61–1.24)

## 2017-10-19 MED ORDER — IOPAMIDOL (ISOVUE-300) INJECTION 61%
100.0000 mL | Freq: Once | INTRAVENOUS | Status: AC | PRN
  Administered 2017-10-19: 100 mL via INTRAVENOUS

## 2017-10-20 DIAGNOSIS — F1027 Alcohol dependence with alcohol-induced persisting dementia: Secondary | ICD-10-CM | POA: Diagnosis not present

## 2017-10-21 DIAGNOSIS — F1027 Alcohol dependence with alcohol-induced persisting dementia: Secondary | ICD-10-CM | POA: Diagnosis not present

## 2017-10-22 DIAGNOSIS — F1027 Alcohol dependence with alcohol-induced persisting dementia: Secondary | ICD-10-CM | POA: Diagnosis not present

## 2017-10-23 DIAGNOSIS — F1027 Alcohol dependence with alcohol-induced persisting dementia: Secondary | ICD-10-CM | POA: Diagnosis not present

## 2017-10-24 DIAGNOSIS — F1027 Alcohol dependence with alcohol-induced persisting dementia: Secondary | ICD-10-CM | POA: Diagnosis not present

## 2017-10-25 DIAGNOSIS — F1027 Alcohol dependence with alcohol-induced persisting dementia: Secondary | ICD-10-CM | POA: Diagnosis not present

## 2017-10-26 DIAGNOSIS — F1027 Alcohol dependence with alcohol-induced persisting dementia: Secondary | ICD-10-CM | POA: Diagnosis not present

## 2017-10-27 DIAGNOSIS — F1027 Alcohol dependence with alcohol-induced persisting dementia: Secondary | ICD-10-CM | POA: Diagnosis not present

## 2017-10-28 DIAGNOSIS — F1027 Alcohol dependence with alcohol-induced persisting dementia: Secondary | ICD-10-CM | POA: Diagnosis not present

## 2017-10-29 DIAGNOSIS — F1010 Alcohol abuse, uncomplicated: Secondary | ICD-10-CM | POA: Diagnosis not present

## 2017-10-29 DIAGNOSIS — F17200 Nicotine dependence, unspecified, uncomplicated: Secondary | ICD-10-CM | POA: Diagnosis not present

## 2017-10-29 DIAGNOSIS — F1027 Alcohol dependence with alcohol-induced persisting dementia: Secondary | ICD-10-CM | POA: Diagnosis not present

## 2017-10-30 DIAGNOSIS — F1027 Alcohol dependence with alcohol-induced persisting dementia: Secondary | ICD-10-CM | POA: Diagnosis not present

## 2017-10-31 DIAGNOSIS — F1027 Alcohol dependence with alcohol-induced persisting dementia: Secondary | ICD-10-CM | POA: Diagnosis not present

## 2017-11-01 DIAGNOSIS — F1027 Alcohol dependence with alcohol-induced persisting dementia: Secondary | ICD-10-CM | POA: Diagnosis not present

## 2017-11-02 DIAGNOSIS — F1027 Alcohol dependence with alcohol-induced persisting dementia: Secondary | ICD-10-CM | POA: Diagnosis not present

## 2017-11-03 DIAGNOSIS — F1027 Alcohol dependence with alcohol-induced persisting dementia: Secondary | ICD-10-CM | POA: Diagnosis not present

## 2017-11-04 DIAGNOSIS — F1027 Alcohol dependence with alcohol-induced persisting dementia: Secondary | ICD-10-CM | POA: Diagnosis not present

## 2017-11-05 DIAGNOSIS — F1027 Alcohol dependence with alcohol-induced persisting dementia: Secondary | ICD-10-CM | POA: Diagnosis not present

## 2017-11-06 DIAGNOSIS — F1027 Alcohol dependence with alcohol-induced persisting dementia: Secondary | ICD-10-CM | POA: Diagnosis not present

## 2017-11-07 ENCOUNTER — Other Ambulatory Visit: Payer: Self-pay | Admitting: Urology

## 2017-11-07 ENCOUNTER — Ambulatory Visit (INDEPENDENT_AMBULATORY_CARE_PROVIDER_SITE_OTHER): Payer: Medicare HMO | Admitting: Urology

## 2017-11-07 DIAGNOSIS — C61 Malignant neoplasm of prostate: Secondary | ICD-10-CM

## 2017-11-09 DIAGNOSIS — F1027 Alcohol dependence with alcohol-induced persisting dementia: Secondary | ICD-10-CM | POA: Diagnosis not present

## 2017-11-10 DIAGNOSIS — F1027 Alcohol dependence with alcohol-induced persisting dementia: Secondary | ICD-10-CM | POA: Diagnosis not present

## 2017-11-11 DIAGNOSIS — F1027 Alcohol dependence with alcohol-induced persisting dementia: Secondary | ICD-10-CM | POA: Diagnosis not present

## 2017-11-12 DIAGNOSIS — F1027 Alcohol dependence with alcohol-induced persisting dementia: Secondary | ICD-10-CM | POA: Diagnosis not present

## 2017-11-12 DIAGNOSIS — M1990 Unspecified osteoarthritis, unspecified site: Secondary | ICD-10-CM | POA: Diagnosis not present

## 2017-11-12 DIAGNOSIS — K219 Gastro-esophageal reflux disease without esophagitis: Secondary | ICD-10-CM | POA: Diagnosis not present

## 2017-11-12 DIAGNOSIS — C61 Malignant neoplasm of prostate: Secondary | ICD-10-CM | POA: Diagnosis not present

## 2017-11-13 DIAGNOSIS — F1027 Alcohol dependence with alcohol-induced persisting dementia: Secondary | ICD-10-CM | POA: Diagnosis not present

## 2017-11-14 DIAGNOSIS — F1027 Alcohol dependence with alcohol-induced persisting dementia: Secondary | ICD-10-CM | POA: Diagnosis not present

## 2017-11-15 DIAGNOSIS — F1027 Alcohol dependence with alcohol-induced persisting dementia: Secondary | ICD-10-CM | POA: Diagnosis not present

## 2017-11-16 DIAGNOSIS — F1027 Alcohol dependence with alcohol-induced persisting dementia: Secondary | ICD-10-CM | POA: Diagnosis not present

## 2017-11-17 DIAGNOSIS — F1027 Alcohol dependence with alcohol-induced persisting dementia: Secondary | ICD-10-CM | POA: Diagnosis not present

## 2017-11-18 DIAGNOSIS — F1027 Alcohol dependence with alcohol-induced persisting dementia: Secondary | ICD-10-CM | POA: Diagnosis not present

## 2017-11-19 DIAGNOSIS — F1027 Alcohol dependence with alcohol-induced persisting dementia: Secondary | ICD-10-CM | POA: Diagnosis not present

## 2017-11-20 DIAGNOSIS — F1027 Alcohol dependence with alcohol-induced persisting dementia: Secondary | ICD-10-CM | POA: Diagnosis not present

## 2017-11-20 DIAGNOSIS — M1990 Unspecified osteoarthritis, unspecified site: Secondary | ICD-10-CM | POA: Diagnosis not present

## 2017-11-20 DIAGNOSIS — B182 Chronic viral hepatitis C: Secondary | ICD-10-CM | POA: Diagnosis not present

## 2017-11-20 DIAGNOSIS — F1010 Alcohol abuse, uncomplicated: Secondary | ICD-10-CM | POA: Diagnosis not present

## 2017-11-20 DIAGNOSIS — Z79899 Other long term (current) drug therapy: Secondary | ICD-10-CM | POA: Diagnosis not present

## 2017-11-21 ENCOUNTER — Ambulatory Visit (HOSPITAL_COMMUNITY): Admission: RE | Admit: 2017-11-21 | Payer: Medicare HMO | Source: Ambulatory Visit

## 2017-11-21 DIAGNOSIS — F1027 Alcohol dependence with alcohol-induced persisting dementia: Secondary | ICD-10-CM | POA: Diagnosis not present

## 2017-11-22 DIAGNOSIS — F1027 Alcohol dependence with alcohol-induced persisting dementia: Secondary | ICD-10-CM | POA: Diagnosis not present

## 2017-11-23 DIAGNOSIS — F1027 Alcohol dependence with alcohol-induced persisting dementia: Secondary | ICD-10-CM | POA: Diagnosis not present

## 2017-11-24 DIAGNOSIS — F1027 Alcohol dependence with alcohol-induced persisting dementia: Secondary | ICD-10-CM | POA: Diagnosis not present

## 2017-11-25 DIAGNOSIS — F1027 Alcohol dependence with alcohol-induced persisting dementia: Secondary | ICD-10-CM | POA: Diagnosis not present

## 2017-11-26 DIAGNOSIS — F1010 Alcohol abuse, uncomplicated: Secondary | ICD-10-CM | POA: Diagnosis not present

## 2017-11-26 DIAGNOSIS — F1027 Alcohol dependence with alcohol-induced persisting dementia: Secondary | ICD-10-CM | POA: Diagnosis not present

## 2017-11-26 DIAGNOSIS — F17200 Nicotine dependence, unspecified, uncomplicated: Secondary | ICD-10-CM | POA: Diagnosis not present

## 2017-11-27 DIAGNOSIS — F1027 Alcohol dependence with alcohol-induced persisting dementia: Secondary | ICD-10-CM | POA: Diagnosis not present

## 2017-11-28 DIAGNOSIS — F1027 Alcohol dependence with alcohol-induced persisting dementia: Secondary | ICD-10-CM | POA: Diagnosis not present

## 2017-11-29 DIAGNOSIS — F1027 Alcohol dependence with alcohol-induced persisting dementia: Secondary | ICD-10-CM | POA: Diagnosis not present

## 2017-11-30 DIAGNOSIS — F1027 Alcohol dependence with alcohol-induced persisting dementia: Secondary | ICD-10-CM | POA: Diagnosis not present

## 2017-12-01 DIAGNOSIS — F1027 Alcohol dependence with alcohol-induced persisting dementia: Secondary | ICD-10-CM | POA: Diagnosis not present

## 2017-12-02 DIAGNOSIS — F1027 Alcohol dependence with alcohol-induced persisting dementia: Secondary | ICD-10-CM | POA: Diagnosis not present

## 2017-12-03 DIAGNOSIS — F1027 Alcohol dependence with alcohol-induced persisting dementia: Secondary | ICD-10-CM | POA: Diagnosis not present

## 2017-12-04 DIAGNOSIS — F1027 Alcohol dependence with alcohol-induced persisting dementia: Secondary | ICD-10-CM | POA: Diagnosis not present

## 2017-12-05 DIAGNOSIS — F1027 Alcohol dependence with alcohol-induced persisting dementia: Secondary | ICD-10-CM | POA: Diagnosis not present

## 2017-12-06 DIAGNOSIS — F1027 Alcohol dependence with alcohol-induced persisting dementia: Secondary | ICD-10-CM | POA: Diagnosis not present

## 2017-12-07 DIAGNOSIS — F1027 Alcohol dependence with alcohol-induced persisting dementia: Secondary | ICD-10-CM | POA: Diagnosis not present

## 2017-12-08 DIAGNOSIS — F1027 Alcohol dependence with alcohol-induced persisting dementia: Secondary | ICD-10-CM | POA: Diagnosis not present

## 2017-12-09 DIAGNOSIS — F1027 Alcohol dependence with alcohol-induced persisting dementia: Secondary | ICD-10-CM | POA: Diagnosis not present

## 2017-12-10 DIAGNOSIS — F1027 Alcohol dependence with alcohol-induced persisting dementia: Secondary | ICD-10-CM | POA: Diagnosis not present

## 2017-12-11 DIAGNOSIS — F1027 Alcohol dependence with alcohol-induced persisting dementia: Secondary | ICD-10-CM | POA: Diagnosis not present

## 2017-12-12 DIAGNOSIS — M1990 Unspecified osteoarthritis, unspecified site: Secondary | ICD-10-CM | POA: Diagnosis not present

## 2017-12-12 DIAGNOSIS — F1027 Alcohol dependence with alcohol-induced persisting dementia: Secondary | ICD-10-CM | POA: Diagnosis not present

## 2017-12-12 DIAGNOSIS — F17290 Nicotine dependence, other tobacco product, uncomplicated: Secondary | ICD-10-CM | POA: Diagnosis not present

## 2017-12-12 DIAGNOSIS — C61 Malignant neoplasm of prostate: Secondary | ICD-10-CM | POA: Diagnosis not present

## 2017-12-12 DIAGNOSIS — F1010 Alcohol abuse, uncomplicated: Secondary | ICD-10-CM | POA: Diagnosis not present

## 2017-12-13 DIAGNOSIS — F1027 Alcohol dependence with alcohol-induced persisting dementia: Secondary | ICD-10-CM | POA: Diagnosis not present

## 2017-12-14 DIAGNOSIS — Z79899 Other long term (current) drug therapy: Secondary | ICD-10-CM | POA: Diagnosis not present

## 2017-12-14 DIAGNOSIS — F1027 Alcohol dependence with alcohol-induced persisting dementia: Secondary | ICD-10-CM | POA: Diagnosis not present

## 2017-12-14 DIAGNOSIS — B182 Chronic viral hepatitis C: Secondary | ICD-10-CM | POA: Diagnosis not present

## 2017-12-14 DIAGNOSIS — F1010 Alcohol abuse, uncomplicated: Secondary | ICD-10-CM | POA: Diagnosis not present

## 2017-12-14 DIAGNOSIS — M1990 Unspecified osteoarthritis, unspecified site: Secondary | ICD-10-CM | POA: Diagnosis not present

## 2017-12-15 DIAGNOSIS — F1027 Alcohol dependence with alcohol-induced persisting dementia: Secondary | ICD-10-CM | POA: Diagnosis not present

## 2017-12-16 DIAGNOSIS — F1027 Alcohol dependence with alcohol-induced persisting dementia: Secondary | ICD-10-CM | POA: Diagnosis not present

## 2017-12-17 DIAGNOSIS — F1027 Alcohol dependence with alcohol-induced persisting dementia: Secondary | ICD-10-CM | POA: Diagnosis not present

## 2017-12-18 DIAGNOSIS — F1027 Alcohol dependence with alcohol-induced persisting dementia: Secondary | ICD-10-CM | POA: Diagnosis not present

## 2017-12-19 ENCOUNTER — Other Ambulatory Visit: Payer: Self-pay | Admitting: Urology

## 2017-12-19 ENCOUNTER — Ambulatory Visit (INDEPENDENT_AMBULATORY_CARE_PROVIDER_SITE_OTHER): Payer: Medicare HMO | Admitting: Urology

## 2017-12-19 DIAGNOSIS — F1027 Alcohol dependence with alcohol-induced persisting dementia: Secondary | ICD-10-CM | POA: Diagnosis not present

## 2017-12-19 DIAGNOSIS — C61 Malignant neoplasm of prostate: Secondary | ICD-10-CM

## 2017-12-20 DIAGNOSIS — F1027 Alcohol dependence with alcohol-induced persisting dementia: Secondary | ICD-10-CM | POA: Diagnosis not present

## 2017-12-21 DIAGNOSIS — C61 Malignant neoplasm of prostate: Secondary | ICD-10-CM | POA: Diagnosis not present

## 2017-12-21 DIAGNOSIS — F1027 Alcohol dependence with alcohol-induced persisting dementia: Secondary | ICD-10-CM | POA: Diagnosis not present

## 2017-12-22 DIAGNOSIS — F1027 Alcohol dependence with alcohol-induced persisting dementia: Secondary | ICD-10-CM | POA: Diagnosis not present

## 2017-12-23 DIAGNOSIS — F1027 Alcohol dependence with alcohol-induced persisting dementia: Secondary | ICD-10-CM | POA: Diagnosis not present

## 2017-12-24 DIAGNOSIS — F1027 Alcohol dependence with alcohol-induced persisting dementia: Secondary | ICD-10-CM | POA: Diagnosis not present

## 2017-12-25 DIAGNOSIS — F1027 Alcohol dependence with alcohol-induced persisting dementia: Secondary | ICD-10-CM | POA: Diagnosis not present

## 2017-12-26 ENCOUNTER — Ambulatory Visit (HOSPITAL_COMMUNITY)
Admission: RE | Admit: 2017-12-26 | Discharge: 2017-12-26 | Disposition: A | Discharge status: Home / Self Care | Payer: Medicare HMO | Source: Ambulatory Visit | Attending: Urology | Admitting: Urology

## 2017-12-26 DIAGNOSIS — C61 Malignant neoplasm of prostate: Secondary | ICD-10-CM | POA: Diagnosis not present

## 2017-12-26 DIAGNOSIS — F1027 Alcohol dependence with alcohol-induced persisting dementia: Secondary | ICD-10-CM | POA: Diagnosis not present

## 2017-12-26 MED ORDER — GENTAMICIN SULFATE 40 MG/ML IJ SOLN
INTRAMUSCULAR | Status: AC
  Administered 2017-12-26: 80 mg via INTRAMUSCULAR
  Filled 2017-12-26: qty 2

## 2017-12-26 MED ORDER — LIDOCAINE HCL (PF) 2 % IJ SOLN
10.0000 mL | Freq: Once | INTRAMUSCULAR | Status: AC
  Administered 2017-12-26: 10 mL

## 2017-12-26 MED ORDER — LIDOCAINE HCL (PF) 2 % IJ SOLN
INTRAMUSCULAR | Status: AC
  Administered 2017-12-26: 10 mL
  Filled 2017-12-26: qty 10

## 2017-12-26 MED ORDER — GENTAMICIN SULFATE 40 MG/ML IJ SOLN
80.0000 mg | Freq: Once | INTRAMUSCULAR | Status: AC
  Administered 2017-12-26: 80 mg via INTRAMUSCULAR

## 2017-12-27 DIAGNOSIS — F1027 Alcohol dependence with alcohol-induced persisting dementia: Secondary | ICD-10-CM | POA: Diagnosis not present

## 2017-12-28 DIAGNOSIS — F1027 Alcohol dependence with alcohol-induced persisting dementia: Secondary | ICD-10-CM | POA: Diagnosis not present

## 2017-12-29 DIAGNOSIS — F1027 Alcohol dependence with alcohol-induced persisting dementia: Secondary | ICD-10-CM | POA: Diagnosis not present

## 2017-12-30 DIAGNOSIS — F1027 Alcohol dependence with alcohol-induced persisting dementia: Secondary | ICD-10-CM | POA: Diagnosis not present

## 2017-12-31 DIAGNOSIS — F1027 Alcohol dependence with alcohol-induced persisting dementia: Secondary | ICD-10-CM | POA: Diagnosis not present

## 2018-01-01 DIAGNOSIS — F1027 Alcohol dependence with alcohol-induced persisting dementia: Secondary | ICD-10-CM | POA: Diagnosis not present

## 2018-01-01 DIAGNOSIS — C61 Malignant neoplasm of prostate: Secondary | ICD-10-CM | POA: Diagnosis not present

## 2018-01-02 DIAGNOSIS — F1027 Alcohol dependence with alcohol-induced persisting dementia: Secondary | ICD-10-CM | POA: Diagnosis not present

## 2018-01-03 DIAGNOSIS — F1027 Alcohol dependence with alcohol-induced persisting dementia: Secondary | ICD-10-CM | POA: Diagnosis not present

## 2018-01-04 DIAGNOSIS — F1027 Alcohol dependence with alcohol-induced persisting dementia: Secondary | ICD-10-CM | POA: Diagnosis not present

## 2018-01-05 DIAGNOSIS — F1027 Alcohol dependence with alcohol-induced persisting dementia: Secondary | ICD-10-CM | POA: Diagnosis not present

## 2018-01-06 DIAGNOSIS — F1027 Alcohol dependence with alcohol-induced persisting dementia: Secondary | ICD-10-CM | POA: Diagnosis not present

## 2018-01-07 DIAGNOSIS — F1027 Alcohol dependence with alcohol-induced persisting dementia: Secondary | ICD-10-CM | POA: Diagnosis not present

## 2018-01-08 DIAGNOSIS — F1027 Alcohol dependence with alcohol-induced persisting dementia: Secondary | ICD-10-CM | POA: Diagnosis not present

## 2018-01-08 DIAGNOSIS — C61 Malignant neoplasm of prostate: Secondary | ICD-10-CM | POA: Diagnosis not present

## 2018-01-09 DIAGNOSIS — F1027 Alcohol dependence with alcohol-induced persisting dementia: Secondary | ICD-10-CM | POA: Diagnosis not present

## 2018-01-10 DIAGNOSIS — F1027 Alcohol dependence with alcohol-induced persisting dementia: Secondary | ICD-10-CM | POA: Diagnosis not present

## 2018-01-11 DIAGNOSIS — K219 Gastro-esophageal reflux disease without esophagitis: Secondary | ICD-10-CM | POA: Diagnosis not present

## 2018-01-11 DIAGNOSIS — C61 Malignant neoplasm of prostate: Secondary | ICD-10-CM | POA: Diagnosis not present

## 2018-01-11 DIAGNOSIS — M1990 Unspecified osteoarthritis, unspecified site: Secondary | ICD-10-CM | POA: Diagnosis not present

## 2018-01-11 DIAGNOSIS — F1027 Alcohol dependence with alcohol-induced persisting dementia: Secondary | ICD-10-CM | POA: Diagnosis not present

## 2018-01-12 DIAGNOSIS — F1027 Alcohol dependence with alcohol-induced persisting dementia: Secondary | ICD-10-CM | POA: Diagnosis not present

## 2018-01-13 DIAGNOSIS — F1027 Alcohol dependence with alcohol-induced persisting dementia: Secondary | ICD-10-CM | POA: Diagnosis not present

## 2018-01-14 DIAGNOSIS — F1027 Alcohol dependence with alcohol-induced persisting dementia: Secondary | ICD-10-CM | POA: Diagnosis not present

## 2018-01-15 DIAGNOSIS — F1027 Alcohol dependence with alcohol-induced persisting dementia: Secondary | ICD-10-CM | POA: Diagnosis not present

## 2018-01-16 DIAGNOSIS — Z510 Encounter for antineoplastic radiation therapy: Secondary | ICD-10-CM | POA: Diagnosis not present

## 2018-01-16 DIAGNOSIS — F1027 Alcohol dependence with alcohol-induced persisting dementia: Secondary | ICD-10-CM | POA: Diagnosis not present

## 2018-01-16 DIAGNOSIS — R3915 Urgency of urination: Secondary | ICD-10-CM | POA: Diagnosis not present

## 2018-01-16 DIAGNOSIS — C61 Malignant neoplasm of prostate: Secondary | ICD-10-CM | POA: Diagnosis not present

## 2018-01-17 DIAGNOSIS — F1027 Alcohol dependence with alcohol-induced persisting dementia: Secondary | ICD-10-CM | POA: Diagnosis not present

## 2018-01-17 DIAGNOSIS — R3915 Urgency of urination: Secondary | ICD-10-CM | POA: Diagnosis not present

## 2018-01-17 DIAGNOSIS — C61 Malignant neoplasm of prostate: Secondary | ICD-10-CM | POA: Diagnosis not present

## 2018-01-17 DIAGNOSIS — Z510 Encounter for antineoplastic radiation therapy: Secondary | ICD-10-CM | POA: Diagnosis not present

## 2018-01-18 DIAGNOSIS — R3915 Urgency of urination: Secondary | ICD-10-CM | POA: Diagnosis not present

## 2018-01-18 DIAGNOSIS — Z510 Encounter for antineoplastic radiation therapy: Secondary | ICD-10-CM | POA: Diagnosis not present

## 2018-01-18 DIAGNOSIS — C61 Malignant neoplasm of prostate: Secondary | ICD-10-CM | POA: Diagnosis not present

## 2018-01-18 DIAGNOSIS — F1027 Alcohol dependence with alcohol-induced persisting dementia: Secondary | ICD-10-CM | POA: Diagnosis not present

## 2018-01-19 DIAGNOSIS — F1027 Alcohol dependence with alcohol-induced persisting dementia: Secondary | ICD-10-CM | POA: Diagnosis not present

## 2018-01-20 DIAGNOSIS — F1027 Alcohol dependence with alcohol-induced persisting dementia: Secondary | ICD-10-CM | POA: Diagnosis not present

## 2018-01-21 DIAGNOSIS — Z510 Encounter for antineoplastic radiation therapy: Secondary | ICD-10-CM | POA: Diagnosis not present

## 2018-01-21 DIAGNOSIS — F1027 Alcohol dependence with alcohol-induced persisting dementia: Secondary | ICD-10-CM | POA: Diagnosis not present

## 2018-01-21 DIAGNOSIS — C61 Malignant neoplasm of prostate: Secondary | ICD-10-CM | POA: Diagnosis not present

## 2018-01-21 DIAGNOSIS — R3915 Urgency of urination: Secondary | ICD-10-CM | POA: Diagnosis not present

## 2018-01-22 DIAGNOSIS — C61 Malignant neoplasm of prostate: Secondary | ICD-10-CM | POA: Diagnosis not present

## 2018-01-22 DIAGNOSIS — F1027 Alcohol dependence with alcohol-induced persisting dementia: Secondary | ICD-10-CM | POA: Diagnosis not present

## 2018-01-22 DIAGNOSIS — Z510 Encounter for antineoplastic radiation therapy: Secondary | ICD-10-CM | POA: Diagnosis not present

## 2018-01-22 DIAGNOSIS — R3915 Urgency of urination: Secondary | ICD-10-CM | POA: Diagnosis not present

## 2018-01-23 DIAGNOSIS — F1027 Alcohol dependence with alcohol-induced persisting dementia: Secondary | ICD-10-CM | POA: Diagnosis not present

## 2018-01-23 DIAGNOSIS — C61 Malignant neoplasm of prostate: Secondary | ICD-10-CM | POA: Diagnosis not present

## 2018-01-23 DIAGNOSIS — Z510 Encounter for antineoplastic radiation therapy: Secondary | ICD-10-CM | POA: Diagnosis not present

## 2018-01-23 DIAGNOSIS — R3915 Urgency of urination: Secondary | ICD-10-CM | POA: Diagnosis not present

## 2018-01-24 DIAGNOSIS — Z510 Encounter for antineoplastic radiation therapy: Secondary | ICD-10-CM | POA: Diagnosis not present

## 2018-01-24 DIAGNOSIS — F1027 Alcohol dependence with alcohol-induced persisting dementia: Secondary | ICD-10-CM | POA: Diagnosis not present

## 2018-01-24 DIAGNOSIS — C61 Malignant neoplasm of prostate: Secondary | ICD-10-CM | POA: Diagnosis not present

## 2018-01-24 DIAGNOSIS — R3915 Urgency of urination: Secondary | ICD-10-CM | POA: Diagnosis not present

## 2018-01-25 DIAGNOSIS — R3915 Urgency of urination: Secondary | ICD-10-CM | POA: Diagnosis not present

## 2018-01-25 DIAGNOSIS — Z510 Encounter for antineoplastic radiation therapy: Secondary | ICD-10-CM | POA: Diagnosis not present

## 2018-01-25 DIAGNOSIS — C61 Malignant neoplasm of prostate: Secondary | ICD-10-CM | POA: Diagnosis not present

## 2018-01-25 DIAGNOSIS — F1027 Alcohol dependence with alcohol-induced persisting dementia: Secondary | ICD-10-CM | POA: Diagnosis not present

## 2018-01-26 DIAGNOSIS — F1027 Alcohol dependence with alcohol-induced persisting dementia: Secondary | ICD-10-CM | POA: Diagnosis not present

## 2018-01-27 DIAGNOSIS — F1027 Alcohol dependence with alcohol-induced persisting dementia: Secondary | ICD-10-CM | POA: Diagnosis not present

## 2018-01-28 DIAGNOSIS — Z510 Encounter for antineoplastic radiation therapy: Secondary | ICD-10-CM | POA: Diagnosis not present

## 2018-01-28 DIAGNOSIS — F1027 Alcohol dependence with alcohol-induced persisting dementia: Secondary | ICD-10-CM | POA: Diagnosis not present

## 2018-01-28 DIAGNOSIS — C61 Malignant neoplasm of prostate: Secondary | ICD-10-CM | POA: Diagnosis not present

## 2018-01-28 DIAGNOSIS — R3915 Urgency of urination: Secondary | ICD-10-CM | POA: Diagnosis not present

## 2018-01-29 DIAGNOSIS — R3915 Urgency of urination: Secondary | ICD-10-CM | POA: Diagnosis not present

## 2018-01-29 DIAGNOSIS — C61 Malignant neoplasm of prostate: Secondary | ICD-10-CM | POA: Diagnosis not present

## 2018-01-29 DIAGNOSIS — Z510 Encounter for antineoplastic radiation therapy: Secondary | ICD-10-CM | POA: Diagnosis not present

## 2018-01-29 DIAGNOSIS — F1027 Alcohol dependence with alcohol-induced persisting dementia: Secondary | ICD-10-CM | POA: Diagnosis not present

## 2018-01-30 DIAGNOSIS — C61 Malignant neoplasm of prostate: Secondary | ICD-10-CM | POA: Diagnosis not present

## 2018-01-30 DIAGNOSIS — Z510 Encounter for antineoplastic radiation therapy: Secondary | ICD-10-CM | POA: Diagnosis not present

## 2018-01-30 DIAGNOSIS — R3915 Urgency of urination: Secondary | ICD-10-CM | POA: Diagnosis not present

## 2018-01-30 DIAGNOSIS — F1027 Alcohol dependence with alcohol-induced persisting dementia: Secondary | ICD-10-CM | POA: Diagnosis not present

## 2018-01-31 DIAGNOSIS — Z510 Encounter for antineoplastic radiation therapy: Secondary | ICD-10-CM | POA: Diagnosis not present

## 2018-01-31 DIAGNOSIS — F1027 Alcohol dependence with alcohol-induced persisting dementia: Secondary | ICD-10-CM | POA: Diagnosis not present

## 2018-01-31 DIAGNOSIS — R3915 Urgency of urination: Secondary | ICD-10-CM | POA: Diagnosis not present

## 2018-01-31 DIAGNOSIS — C61 Malignant neoplasm of prostate: Secondary | ICD-10-CM | POA: Diagnosis not present

## 2018-02-01 DIAGNOSIS — F1027 Alcohol dependence with alcohol-induced persisting dementia: Secondary | ICD-10-CM | POA: Diagnosis not present

## 2018-02-01 DIAGNOSIS — R3915 Urgency of urination: Secondary | ICD-10-CM | POA: Diagnosis not present

## 2018-02-01 DIAGNOSIS — Z510 Encounter for antineoplastic radiation therapy: Secondary | ICD-10-CM | POA: Diagnosis not present

## 2018-02-01 DIAGNOSIS — C61 Malignant neoplasm of prostate: Secondary | ICD-10-CM | POA: Diagnosis not present

## 2018-02-02 DIAGNOSIS — F1027 Alcohol dependence with alcohol-induced persisting dementia: Secondary | ICD-10-CM | POA: Diagnosis not present

## 2018-02-03 DIAGNOSIS — F1027 Alcohol dependence with alcohol-induced persisting dementia: Secondary | ICD-10-CM | POA: Diagnosis not present

## 2018-02-04 DIAGNOSIS — F1027 Alcohol dependence with alcohol-induced persisting dementia: Secondary | ICD-10-CM | POA: Diagnosis not present

## 2018-02-05 DIAGNOSIS — C61 Malignant neoplasm of prostate: Secondary | ICD-10-CM | POA: Diagnosis not present

## 2018-02-05 DIAGNOSIS — Z510 Encounter for antineoplastic radiation therapy: Secondary | ICD-10-CM | POA: Diagnosis not present

## 2018-02-05 DIAGNOSIS — R3915 Urgency of urination: Secondary | ICD-10-CM | POA: Diagnosis not present

## 2018-02-05 DIAGNOSIS — F1027 Alcohol dependence with alcohol-induced persisting dementia: Secondary | ICD-10-CM | POA: Diagnosis not present

## 2018-02-06 DIAGNOSIS — R3915 Urgency of urination: Secondary | ICD-10-CM | POA: Diagnosis not present

## 2018-02-06 DIAGNOSIS — F1027 Alcohol dependence with alcohol-induced persisting dementia: Secondary | ICD-10-CM | POA: Diagnosis not present

## 2018-02-06 DIAGNOSIS — C61 Malignant neoplasm of prostate: Secondary | ICD-10-CM | POA: Diagnosis not present

## 2018-02-06 DIAGNOSIS — Z510 Encounter for antineoplastic radiation therapy: Secondary | ICD-10-CM | POA: Diagnosis not present

## 2018-02-07 DIAGNOSIS — R3915 Urgency of urination: Secondary | ICD-10-CM | POA: Diagnosis not present

## 2018-02-07 DIAGNOSIS — C61 Malignant neoplasm of prostate: Secondary | ICD-10-CM | POA: Diagnosis not present

## 2018-02-07 DIAGNOSIS — Z510 Encounter for antineoplastic radiation therapy: Secondary | ICD-10-CM | POA: Diagnosis not present

## 2018-02-07 DIAGNOSIS — F1027 Alcohol dependence with alcohol-induced persisting dementia: Secondary | ICD-10-CM | POA: Diagnosis not present

## 2018-02-08 DIAGNOSIS — R3915 Urgency of urination: Secondary | ICD-10-CM | POA: Diagnosis not present

## 2018-02-08 DIAGNOSIS — C61 Malignant neoplasm of prostate: Secondary | ICD-10-CM | POA: Diagnosis not present

## 2018-02-08 DIAGNOSIS — F1027 Alcohol dependence with alcohol-induced persisting dementia: Secondary | ICD-10-CM | POA: Diagnosis not present

## 2018-02-08 DIAGNOSIS — Z510 Encounter for antineoplastic radiation therapy: Secondary | ICD-10-CM | POA: Diagnosis not present

## 2018-02-09 DIAGNOSIS — F1027 Alcohol dependence with alcohol-induced persisting dementia: Secondary | ICD-10-CM | POA: Diagnosis not present

## 2018-02-10 DIAGNOSIS — F1027 Alcohol dependence with alcohol-induced persisting dementia: Secondary | ICD-10-CM | POA: Diagnosis not present

## 2018-02-11 DIAGNOSIS — R3915 Urgency of urination: Secondary | ICD-10-CM | POA: Diagnosis not present

## 2018-02-11 DIAGNOSIS — F1027 Alcohol dependence with alcohol-induced persisting dementia: Secondary | ICD-10-CM | POA: Diagnosis not present

## 2018-02-11 DIAGNOSIS — R351 Nocturia: Secondary | ICD-10-CM | POA: Diagnosis not present

## 2018-02-11 DIAGNOSIS — R350 Frequency of micturition: Secondary | ICD-10-CM | POA: Diagnosis not present

## 2018-02-11 DIAGNOSIS — C61 Malignant neoplasm of prostate: Secondary | ICD-10-CM | POA: Diagnosis not present

## 2018-02-11 DIAGNOSIS — Z510 Encounter for antineoplastic radiation therapy: Secondary | ICD-10-CM | POA: Diagnosis not present

## 2018-02-12 DIAGNOSIS — C61 Malignant neoplasm of prostate: Secondary | ICD-10-CM | POA: Diagnosis not present

## 2018-02-12 DIAGNOSIS — R3915 Urgency of urination: Secondary | ICD-10-CM | POA: Diagnosis not present

## 2018-02-12 DIAGNOSIS — F1010 Alcohol abuse, uncomplicated: Secondary | ICD-10-CM | POA: Diagnosis not present

## 2018-02-12 DIAGNOSIS — R351 Nocturia: Secondary | ICD-10-CM | POA: Diagnosis not present

## 2018-02-12 DIAGNOSIS — Z79899 Other long term (current) drug therapy: Secondary | ICD-10-CM | POA: Diagnosis not present

## 2018-02-12 DIAGNOSIS — Z510 Encounter for antineoplastic radiation therapy: Secondary | ICD-10-CM | POA: Diagnosis not present

## 2018-02-12 DIAGNOSIS — B182 Chronic viral hepatitis C: Secondary | ICD-10-CM | POA: Diagnosis not present

## 2018-02-12 DIAGNOSIS — F1027 Alcohol dependence with alcohol-induced persisting dementia: Secondary | ICD-10-CM | POA: Diagnosis not present

## 2018-02-12 DIAGNOSIS — R350 Frequency of micturition: Secondary | ICD-10-CM | POA: Diagnosis not present

## 2018-02-12 DIAGNOSIS — M1990 Unspecified osteoarthritis, unspecified site: Secondary | ICD-10-CM | POA: Diagnosis not present

## 2018-02-12 DIAGNOSIS — E559 Vitamin D deficiency, unspecified: Secondary | ICD-10-CM | POA: Diagnosis not present

## 2018-02-13 DIAGNOSIS — R350 Frequency of micturition: Secondary | ICD-10-CM | POA: Diagnosis not present

## 2018-02-13 DIAGNOSIS — Z510 Encounter for antineoplastic radiation therapy: Secondary | ICD-10-CM | POA: Diagnosis not present

## 2018-02-13 DIAGNOSIS — C61 Malignant neoplasm of prostate: Secondary | ICD-10-CM | POA: Diagnosis not present

## 2018-02-13 DIAGNOSIS — R3915 Urgency of urination: Secondary | ICD-10-CM | POA: Diagnosis not present

## 2018-02-13 DIAGNOSIS — R351 Nocturia: Secondary | ICD-10-CM | POA: Diagnosis not present

## 2018-02-13 DIAGNOSIS — F1027 Alcohol dependence with alcohol-induced persisting dementia: Secondary | ICD-10-CM | POA: Diagnosis not present

## 2018-02-14 DIAGNOSIS — R351 Nocturia: Secondary | ICD-10-CM | POA: Diagnosis not present

## 2018-02-14 DIAGNOSIS — R3915 Urgency of urination: Secondary | ICD-10-CM | POA: Diagnosis not present

## 2018-02-14 DIAGNOSIS — Z510 Encounter for antineoplastic radiation therapy: Secondary | ICD-10-CM | POA: Diagnosis not present

## 2018-02-14 DIAGNOSIS — C61 Malignant neoplasm of prostate: Secondary | ICD-10-CM | POA: Diagnosis not present

## 2018-02-14 DIAGNOSIS — B1920 Unspecified viral hepatitis C without hepatic coma: Secondary | ICD-10-CM | POA: Diagnosis not present

## 2018-02-14 DIAGNOSIS — R350 Frequency of micturition: Secondary | ICD-10-CM | POA: Diagnosis not present

## 2018-02-14 DIAGNOSIS — F17290 Nicotine dependence, other tobacco product, uncomplicated: Secondary | ICD-10-CM | POA: Diagnosis not present

## 2018-02-14 DIAGNOSIS — F1027 Alcohol dependence with alcohol-induced persisting dementia: Secondary | ICD-10-CM | POA: Diagnosis not present

## 2018-02-14 DIAGNOSIS — K219 Gastro-esophageal reflux disease without esophagitis: Secondary | ICD-10-CM | POA: Diagnosis not present

## 2018-02-14 DIAGNOSIS — F1010 Alcohol abuse, uncomplicated: Secondary | ICD-10-CM | POA: Diagnosis not present

## 2018-02-14 DIAGNOSIS — M1990 Unspecified osteoarthritis, unspecified site: Secondary | ICD-10-CM | POA: Diagnosis not present

## 2018-02-15 DIAGNOSIS — C61 Malignant neoplasm of prostate: Secondary | ICD-10-CM | POA: Diagnosis not present

## 2018-02-15 DIAGNOSIS — F1027 Alcohol dependence with alcohol-induced persisting dementia: Secondary | ICD-10-CM | POA: Diagnosis not present

## 2018-02-15 DIAGNOSIS — Z510 Encounter for antineoplastic radiation therapy: Secondary | ICD-10-CM | POA: Diagnosis not present

## 2018-02-15 DIAGNOSIS — R351 Nocturia: Secondary | ICD-10-CM | POA: Diagnosis not present

## 2018-02-15 DIAGNOSIS — R3915 Urgency of urination: Secondary | ICD-10-CM | POA: Diagnosis not present

## 2018-02-15 DIAGNOSIS — R350 Frequency of micturition: Secondary | ICD-10-CM | POA: Diagnosis not present

## 2018-02-16 DIAGNOSIS — F1027 Alcohol dependence with alcohol-induced persisting dementia: Secondary | ICD-10-CM | POA: Diagnosis not present

## 2018-02-17 DIAGNOSIS — F1027 Alcohol dependence with alcohol-induced persisting dementia: Secondary | ICD-10-CM | POA: Diagnosis not present

## 2018-02-18 DIAGNOSIS — R3915 Urgency of urination: Secondary | ICD-10-CM | POA: Diagnosis not present

## 2018-02-18 DIAGNOSIS — R351 Nocturia: Secondary | ICD-10-CM | POA: Diagnosis not present

## 2018-02-18 DIAGNOSIS — Z510 Encounter for antineoplastic radiation therapy: Secondary | ICD-10-CM | POA: Diagnosis not present

## 2018-02-18 DIAGNOSIS — C61 Malignant neoplasm of prostate: Secondary | ICD-10-CM | POA: Diagnosis not present

## 2018-02-18 DIAGNOSIS — F1027 Alcohol dependence with alcohol-induced persisting dementia: Secondary | ICD-10-CM | POA: Diagnosis not present

## 2018-02-18 DIAGNOSIS — R350 Frequency of micturition: Secondary | ICD-10-CM | POA: Diagnosis not present

## 2018-02-19 DIAGNOSIS — F1027 Alcohol dependence with alcohol-induced persisting dementia: Secondary | ICD-10-CM | POA: Diagnosis not present

## 2018-02-19 DIAGNOSIS — R350 Frequency of micturition: Secondary | ICD-10-CM | POA: Diagnosis not present

## 2018-02-19 DIAGNOSIS — R351 Nocturia: Secondary | ICD-10-CM | POA: Diagnosis not present

## 2018-02-19 DIAGNOSIS — Z510 Encounter for antineoplastic radiation therapy: Secondary | ICD-10-CM | POA: Diagnosis not present

## 2018-02-19 DIAGNOSIS — R3915 Urgency of urination: Secondary | ICD-10-CM | POA: Diagnosis not present

## 2018-02-19 DIAGNOSIS — C61 Malignant neoplasm of prostate: Secondary | ICD-10-CM | POA: Diagnosis not present

## 2018-02-20 DIAGNOSIS — R350 Frequency of micturition: Secondary | ICD-10-CM | POA: Diagnosis not present

## 2018-02-20 DIAGNOSIS — R351 Nocturia: Secondary | ICD-10-CM | POA: Diagnosis not present

## 2018-02-20 DIAGNOSIS — R3915 Urgency of urination: Secondary | ICD-10-CM | POA: Diagnosis not present

## 2018-02-20 DIAGNOSIS — C61 Malignant neoplasm of prostate: Secondary | ICD-10-CM | POA: Diagnosis not present

## 2018-02-20 DIAGNOSIS — Z510 Encounter for antineoplastic radiation therapy: Secondary | ICD-10-CM | POA: Diagnosis not present

## 2018-02-20 DIAGNOSIS — F1027 Alcohol dependence with alcohol-induced persisting dementia: Secondary | ICD-10-CM | POA: Diagnosis not present

## 2018-02-21 DIAGNOSIS — R351 Nocturia: Secondary | ICD-10-CM | POA: Diagnosis not present

## 2018-02-21 DIAGNOSIS — F1027 Alcohol dependence with alcohol-induced persisting dementia: Secondary | ICD-10-CM | POA: Diagnosis not present

## 2018-02-21 DIAGNOSIS — R3915 Urgency of urination: Secondary | ICD-10-CM | POA: Diagnosis not present

## 2018-02-21 DIAGNOSIS — C61 Malignant neoplasm of prostate: Secondary | ICD-10-CM | POA: Diagnosis not present

## 2018-02-21 DIAGNOSIS — R350 Frequency of micturition: Secondary | ICD-10-CM | POA: Diagnosis not present

## 2018-02-21 DIAGNOSIS — Z510 Encounter for antineoplastic radiation therapy: Secondary | ICD-10-CM | POA: Diagnosis not present

## 2018-02-22 DIAGNOSIS — Z510 Encounter for antineoplastic radiation therapy: Secondary | ICD-10-CM | POA: Diagnosis not present

## 2018-02-22 DIAGNOSIS — C61 Malignant neoplasm of prostate: Secondary | ICD-10-CM | POA: Diagnosis not present

## 2018-02-22 DIAGNOSIS — R3915 Urgency of urination: Secondary | ICD-10-CM | POA: Diagnosis not present

## 2018-02-22 DIAGNOSIS — R350 Frequency of micturition: Secondary | ICD-10-CM | POA: Diagnosis not present

## 2018-02-22 DIAGNOSIS — R351 Nocturia: Secondary | ICD-10-CM | POA: Diagnosis not present

## 2018-02-25 DIAGNOSIS — C61 Malignant neoplasm of prostate: Secondary | ICD-10-CM | POA: Diagnosis not present

## 2018-02-25 DIAGNOSIS — R3915 Urgency of urination: Secondary | ICD-10-CM | POA: Diagnosis not present

## 2018-02-25 DIAGNOSIS — Z510 Encounter for antineoplastic radiation therapy: Secondary | ICD-10-CM | POA: Diagnosis not present

## 2018-02-25 DIAGNOSIS — R350 Frequency of micturition: Secondary | ICD-10-CM | POA: Diagnosis not present

## 2018-02-25 DIAGNOSIS — R351 Nocturia: Secondary | ICD-10-CM | POA: Diagnosis not present

## 2018-02-26 DIAGNOSIS — F1027 Alcohol dependence with alcohol-induced persisting dementia: Secondary | ICD-10-CM | POA: Diagnosis not present

## 2018-02-27 DIAGNOSIS — F1027 Alcohol dependence with alcohol-induced persisting dementia: Secondary | ICD-10-CM | POA: Diagnosis not present

## 2018-02-28 DIAGNOSIS — F1027 Alcohol dependence with alcohol-induced persisting dementia: Secondary | ICD-10-CM | POA: Diagnosis not present

## 2018-03-01 DIAGNOSIS — F1027 Alcohol dependence with alcohol-induced persisting dementia: Secondary | ICD-10-CM | POA: Diagnosis not present

## 2018-03-02 DIAGNOSIS — F1027 Alcohol dependence with alcohol-induced persisting dementia: Secondary | ICD-10-CM | POA: Diagnosis not present

## 2018-03-03 DIAGNOSIS — F1027 Alcohol dependence with alcohol-induced persisting dementia: Secondary | ICD-10-CM | POA: Diagnosis not present

## 2018-03-04 DIAGNOSIS — F1027 Alcohol dependence with alcohol-induced persisting dementia: Secondary | ICD-10-CM | POA: Diagnosis not present

## 2018-03-05 DIAGNOSIS — F1027 Alcohol dependence with alcohol-induced persisting dementia: Secondary | ICD-10-CM | POA: Diagnosis not present

## 2018-03-06 DIAGNOSIS — F1027 Alcohol dependence with alcohol-induced persisting dementia: Secondary | ICD-10-CM | POA: Diagnosis not present

## 2018-03-07 DIAGNOSIS — F1027 Alcohol dependence with alcohol-induced persisting dementia: Secondary | ICD-10-CM | POA: Diagnosis not present

## 2018-03-08 DIAGNOSIS — F1027 Alcohol dependence with alcohol-induced persisting dementia: Secondary | ICD-10-CM | POA: Diagnosis not present

## 2018-03-09 DIAGNOSIS — F1027 Alcohol dependence with alcohol-induced persisting dementia: Secondary | ICD-10-CM | POA: Diagnosis not present

## 2018-03-10 DIAGNOSIS — F1027 Alcohol dependence with alcohol-induced persisting dementia: Secondary | ICD-10-CM | POA: Diagnosis not present

## 2018-03-13 DIAGNOSIS — F1010 Alcohol abuse, uncomplicated: Secondary | ICD-10-CM | POA: Diagnosis not present

## 2018-03-13 DIAGNOSIS — C61 Malignant neoplasm of prostate: Secondary | ICD-10-CM | POA: Diagnosis not present

## 2018-03-13 DIAGNOSIS — M1990 Unspecified osteoarthritis, unspecified site: Secondary | ICD-10-CM | POA: Diagnosis not present

## 2018-03-13 DIAGNOSIS — K219 Gastro-esophageal reflux disease without esophagitis: Secondary | ICD-10-CM | POA: Diagnosis not present

## 2018-03-27 DIAGNOSIS — F1010 Alcohol abuse, uncomplicated: Secondary | ICD-10-CM | POA: Diagnosis not present

## 2018-03-27 DIAGNOSIS — F1027 Alcohol dependence with alcohol-induced persisting dementia: Secondary | ICD-10-CM | POA: Diagnosis not present

## 2018-04-15 DIAGNOSIS — K219 Gastro-esophageal reflux disease without esophagitis: Secondary | ICD-10-CM | POA: Diagnosis not present

## 2018-04-15 DIAGNOSIS — F17200 Nicotine dependence, unspecified, uncomplicated: Secondary | ICD-10-CM | POA: Diagnosis not present

## 2018-04-15 DIAGNOSIS — F1027 Alcohol dependence with alcohol-induced persisting dementia: Secondary | ICD-10-CM | POA: Diagnosis not present

## 2018-04-15 DIAGNOSIS — M1990 Unspecified osteoarthritis, unspecified site: Secondary | ICD-10-CM | POA: Diagnosis not present

## 2018-05-08 ENCOUNTER — Ambulatory Visit: Payer: Medicare HMO | Admitting: Urology

## 2018-05-18 DIAGNOSIS — M4802 Spinal stenosis, cervical region: Secondary | ICD-10-CM | POA: Diagnosis not present

## 2018-05-18 DIAGNOSIS — J90 Pleural effusion, not elsewhere classified: Secondary | ICD-10-CM | POA: Diagnosis not present

## 2018-05-18 DIAGNOSIS — R402142 Coma scale, eyes open, spontaneous, at arrival to emergency department: Secondary | ICD-10-CM | POA: Diagnosis not present

## 2018-05-18 DIAGNOSIS — Z4682 Encounter for fitting and adjustment of non-vascular catheter: Secondary | ICD-10-CM | POA: Diagnosis not present

## 2018-05-18 DIAGNOSIS — Z23 Encounter for immunization: Secondary | ICD-10-CM | POA: Diagnosis not present

## 2018-05-18 DIAGNOSIS — R079 Chest pain, unspecified: Secondary | ICD-10-CM | POA: Diagnosis not present

## 2018-05-18 DIAGNOSIS — K219 Gastro-esophageal reflux disease without esophagitis: Secondary | ICD-10-CM | POA: Diagnosis not present

## 2018-05-18 DIAGNOSIS — S3993XA Unspecified injury of pelvis, initial encounter: Secondary | ICD-10-CM | POA: Diagnosis not present

## 2018-05-18 DIAGNOSIS — S299XXA Unspecified injury of thorax, initial encounter: Secondary | ICD-10-CM | POA: Diagnosis not present

## 2018-05-18 DIAGNOSIS — J948 Other specified pleural conditions: Secondary | ICD-10-CM | POA: Diagnosis not present

## 2018-05-18 DIAGNOSIS — J939 Pneumothorax, unspecified: Secondary | ICD-10-CM | POA: Diagnosis not present

## 2018-05-18 DIAGNOSIS — R918 Other nonspecific abnormal finding of lung field: Secondary | ICD-10-CM | POA: Diagnosis not present

## 2018-05-18 DIAGNOSIS — I2510 Atherosclerotic heart disease of native coronary artery without angina pectoris: Secondary | ICD-10-CM | POA: Diagnosis not present

## 2018-05-18 DIAGNOSIS — S3992XA Unspecified injury of lower back, initial encounter: Secondary | ICD-10-CM | POA: Diagnosis not present

## 2018-05-18 DIAGNOSIS — R001 Bradycardia, unspecified: Secondary | ICD-10-CM | POA: Diagnosis not present

## 2018-05-18 DIAGNOSIS — S2242XA Multiple fractures of ribs, left side, initial encounter for closed fracture: Secondary | ICD-10-CM | POA: Diagnosis not present

## 2018-05-18 DIAGNOSIS — S4991XA Unspecified injury of right shoulder and upper arm, initial encounter: Secondary | ICD-10-CM | POA: Diagnosis not present

## 2018-05-18 DIAGNOSIS — S069X9A Unspecified intracranial injury with loss of consciousness of unspecified duration, initial encounter: Secondary | ICD-10-CM | POA: Diagnosis not present

## 2018-05-18 DIAGNOSIS — F17200 Nicotine dependence, unspecified, uncomplicated: Secondary | ICD-10-CM | POA: Diagnosis not present

## 2018-05-18 DIAGNOSIS — R1012 Left upper quadrant pain: Secondary | ICD-10-CM | POA: Diagnosis not present

## 2018-05-18 DIAGNOSIS — S3991XA Unspecified injury of abdomen, initial encounter: Secondary | ICD-10-CM | POA: Diagnosis not present

## 2018-05-18 DIAGNOSIS — S270XXA Traumatic pneumothorax, initial encounter: Secondary | ICD-10-CM | POA: Diagnosis not present

## 2018-05-18 DIAGNOSIS — S271XXA Traumatic hemothorax, initial encounter: Secondary | ICD-10-CM | POA: Diagnosis not present

## 2018-05-18 DIAGNOSIS — M47812 Spondylosis without myelopathy or radiculopathy, cervical region: Secondary | ICD-10-CM | POA: Diagnosis not present

## 2018-05-18 DIAGNOSIS — R402252 Coma scale, best verbal response, oriented, at arrival to emergency department: Secondary | ICD-10-CM | POA: Diagnosis not present

## 2018-05-18 DIAGNOSIS — R402362 Coma scale, best motor response, obeys commands, at arrival to emergency department: Secondary | ICD-10-CM | POA: Diagnosis not present

## 2018-05-18 DIAGNOSIS — J9811 Atelectasis: Secondary | ICD-10-CM | POA: Diagnosis not present

## 2018-05-18 DIAGNOSIS — S0990XA Unspecified injury of head, initial encounter: Secondary | ICD-10-CM | POA: Diagnosis not present

## 2018-05-18 DIAGNOSIS — F17210 Nicotine dependence, cigarettes, uncomplicated: Secondary | ICD-10-CM | POA: Diagnosis not present

## 2018-05-18 DIAGNOSIS — R41841 Cognitive communication deficit: Secondary | ICD-10-CM | POA: Diagnosis not present

## 2018-05-22 DIAGNOSIS — Z23 Encounter for immunization: Secondary | ICD-10-CM | POA: Diagnosis not present

## 2018-05-23 DIAGNOSIS — Z4800 Encounter for change or removal of nonsurgical wound dressing: Secondary | ICD-10-CM | POA: Diagnosis not present

## 2018-05-23 DIAGNOSIS — T82897A Other specified complication of cardiac prosthetic devices, implants and grafts, initial encounter: Secondary | ICD-10-CM | POA: Diagnosis not present

## 2018-05-23 DIAGNOSIS — S2242XA Multiple fractures of ribs, left side, initial encounter for closed fracture: Secondary | ICD-10-CM | POA: Diagnosis not present

## 2018-05-27 DIAGNOSIS — M1990 Unspecified osteoarthritis, unspecified site: Secondary | ICD-10-CM | POA: Diagnosis not present

## 2018-05-27 DIAGNOSIS — E559 Vitamin D deficiency, unspecified: Secondary | ICD-10-CM | POA: Diagnosis not present

## 2018-05-27 DIAGNOSIS — Z79899 Other long term (current) drug therapy: Secondary | ICD-10-CM | POA: Diagnosis not present

## 2018-05-27 DIAGNOSIS — B182 Chronic viral hepatitis C: Secondary | ICD-10-CM | POA: Diagnosis not present

## 2018-05-27 DIAGNOSIS — F1010 Alcohol abuse, uncomplicated: Secondary | ICD-10-CM | POA: Diagnosis not present

## 2018-06-07 DIAGNOSIS — T1490XA Injury, unspecified, initial encounter: Secondary | ICD-10-CM | POA: Diagnosis not present

## 2018-06-07 DIAGNOSIS — M1990 Unspecified osteoarthritis, unspecified site: Secondary | ICD-10-CM | POA: Diagnosis not present

## 2018-06-07 DIAGNOSIS — K219 Gastro-esophageal reflux disease without esophagitis: Secondary | ICD-10-CM | POA: Diagnosis not present

## 2018-06-07 DIAGNOSIS — T1490XS Injury, unspecified, sequela: Secondary | ICD-10-CM | POA: Diagnosis not present

## 2018-07-09 DIAGNOSIS — Z23 Encounter for immunization: Secondary | ICD-10-CM | POA: Diagnosis not present

## 2018-08-27 DIAGNOSIS — F17200 Nicotine dependence, unspecified, uncomplicated: Secondary | ICD-10-CM | POA: Diagnosis not present

## 2018-08-27 DIAGNOSIS — F1027 Alcohol dependence with alcohol-induced persisting dementia: Secondary | ICD-10-CM | POA: Diagnosis not present

## 2018-08-27 DIAGNOSIS — F1010 Alcohol abuse, uncomplicated: Secondary | ICD-10-CM | POA: Diagnosis not present

## 2018-08-28 DIAGNOSIS — M1990 Unspecified osteoarthritis, unspecified site: Secondary | ICD-10-CM | POA: Diagnosis not present

## 2018-08-28 DIAGNOSIS — Z79899 Other long term (current) drug therapy: Secondary | ICD-10-CM | POA: Diagnosis not present

## 2018-08-28 DIAGNOSIS — B182 Chronic viral hepatitis C: Secondary | ICD-10-CM | POA: Diagnosis not present

## 2018-08-28 DIAGNOSIS — E559 Vitamin D deficiency, unspecified: Secondary | ICD-10-CM | POA: Diagnosis not present

## 2018-08-28 DIAGNOSIS — F1010 Alcohol abuse, uncomplicated: Secondary | ICD-10-CM | POA: Diagnosis not present

## 2018-09-19 DIAGNOSIS — Z923 Personal history of irradiation: Secondary | ICD-10-CM | POA: Diagnosis not present

## 2018-09-19 DIAGNOSIS — C61 Malignant neoplasm of prostate: Secondary | ICD-10-CM | POA: Diagnosis not present

## 2018-10-22 DIAGNOSIS — K219 Gastro-esophageal reflux disease without esophagitis: Secondary | ICD-10-CM | POA: Diagnosis not present

## 2018-10-22 DIAGNOSIS — M1990 Unspecified osteoarthritis, unspecified site: Secondary | ICD-10-CM | POA: Diagnosis not present

## 2018-10-22 DIAGNOSIS — F1010 Alcohol abuse, uncomplicated: Secondary | ICD-10-CM | POA: Diagnosis not present

## 2018-10-22 DIAGNOSIS — F1027 Alcohol dependence with alcohol-induced persisting dementia: Secondary | ICD-10-CM | POA: Diagnosis not present

## 2018-12-09 DIAGNOSIS — Z79899 Other long term (current) drug therapy: Secondary | ICD-10-CM | POA: Diagnosis not present

## 2018-12-09 DIAGNOSIS — F1010 Alcohol abuse, uncomplicated: Secondary | ICD-10-CM | POA: Diagnosis not present

## 2018-12-09 DIAGNOSIS — B182 Chronic viral hepatitis C: Secondary | ICD-10-CM | POA: Diagnosis not present

## 2019-03-20 DIAGNOSIS — C61 Malignant neoplasm of prostate: Secondary | ICD-10-CM | POA: Diagnosis not present

## 2019-03-28 DIAGNOSIS — C61 Malignant neoplasm of prostate: Secondary | ICD-10-CM | POA: Diagnosis not present

## 2019-04-08 DIAGNOSIS — K219 Gastro-esophageal reflux disease without esophagitis: Secondary | ICD-10-CM | POA: Diagnosis not present

## 2019-04-08 DIAGNOSIS — M1990 Unspecified osteoarthritis, unspecified site: Secondary | ICD-10-CM | POA: Diagnosis not present

## 2019-04-08 DIAGNOSIS — F1010 Alcohol abuse, uncomplicated: Secondary | ICD-10-CM | POA: Diagnosis not present

## 2019-05-01 DIAGNOSIS — C61 Malignant neoplasm of prostate: Secondary | ICD-10-CM | POA: Diagnosis not present

## 2019-05-01 DIAGNOSIS — M1990 Unspecified osteoarthritis, unspecified site: Secondary | ICD-10-CM | POA: Diagnosis not present

## 2019-05-01 DIAGNOSIS — F1027 Alcohol dependence with alcohol-induced persisting dementia: Secondary | ICD-10-CM | POA: Diagnosis not present

## 2019-05-01 DIAGNOSIS — K219 Gastro-esophageal reflux disease without esophagitis: Secondary | ICD-10-CM | POA: Diagnosis not present

## 2019-05-01 DIAGNOSIS — Z79899 Other long term (current) drug therapy: Secondary | ICD-10-CM | POA: Diagnosis not present

## 2019-05-01 DIAGNOSIS — B1920 Unspecified viral hepatitis C without hepatic coma: Secondary | ICD-10-CM | POA: Diagnosis not present

## 2019-05-01 DIAGNOSIS — R972 Elevated prostate specific antigen [PSA]: Secondary | ICD-10-CM | POA: Diagnosis not present

## 2019-05-01 DIAGNOSIS — B182 Chronic viral hepatitis C: Secondary | ICD-10-CM | POA: Diagnosis not present

## 2019-05-01 DIAGNOSIS — F1010 Alcohol abuse, uncomplicated: Secondary | ICD-10-CM | POA: Diagnosis not present

## 2019-05-01 DIAGNOSIS — F17200 Nicotine dependence, unspecified, uncomplicated: Secondary | ICD-10-CM | POA: Diagnosis not present

## 2019-07-08 ENCOUNTER — Other Ambulatory Visit (HOSPITAL_COMMUNITY): Payer: Self-pay | Admitting: Family Medicine

## 2019-07-08 DIAGNOSIS — R221 Localized swelling, mass and lump, neck: Secondary | ICD-10-CM | POA: Diagnosis not present

## 2019-07-08 DIAGNOSIS — Z23 Encounter for immunization: Secondary | ICD-10-CM | POA: Diagnosis not present

## 2019-07-08 DIAGNOSIS — M1990 Unspecified osteoarthritis, unspecified site: Secondary | ICD-10-CM | POA: Diagnosis not present

## 2019-07-08 DIAGNOSIS — F1010 Alcohol abuse, uncomplicated: Secondary | ICD-10-CM | POA: Diagnosis not present

## 2019-07-09 ENCOUNTER — Other Ambulatory Visit: Payer: Self-pay

## 2019-07-09 ENCOUNTER — Other Ambulatory Visit (HOSPITAL_COMMUNITY): Payer: Self-pay | Admitting: Family Medicine

## 2019-07-09 ENCOUNTER — Ambulatory Visit (HOSPITAL_COMMUNITY)
Admission: RE | Admit: 2019-07-09 | Discharge: 2019-07-09 | Disposition: A | Discharge status: Home / Self Care | Payer: Medicare HMO | Source: Ambulatory Visit | Attending: Family Medicine | Admitting: Family Medicine

## 2019-07-09 DIAGNOSIS — R221 Localized swelling, mass and lump, neck: Secondary | ICD-10-CM | POA: Diagnosis not present

## 2019-07-17 DIAGNOSIS — K219 Gastro-esophageal reflux disease without esophagitis: Secondary | ICD-10-CM | POA: Diagnosis not present

## 2019-07-17 DIAGNOSIS — B182 Chronic viral hepatitis C: Secondary | ICD-10-CM | POA: Diagnosis not present

## 2019-07-17 DIAGNOSIS — M1990 Unspecified osteoarthritis, unspecified site: Secondary | ICD-10-CM | POA: Diagnosis not present

## 2019-07-17 DIAGNOSIS — R972 Elevated prostate specific antigen [PSA]: Secondary | ICD-10-CM | POA: Diagnosis not present

## 2019-07-17 DIAGNOSIS — Z79899 Other long term (current) drug therapy: Secondary | ICD-10-CM | POA: Diagnosis not present

## 2019-07-17 DIAGNOSIS — F1010 Alcohol abuse, uncomplicated: Secondary | ICD-10-CM | POA: Diagnosis not present

## 2019-07-17 DIAGNOSIS — F1027 Alcohol dependence with alcohol-induced persisting dementia: Secondary | ICD-10-CM | POA: Diagnosis not present

## 2019-07-24 DIAGNOSIS — R972 Elevated prostate specific antigen [PSA]: Secondary | ICD-10-CM | POA: Diagnosis not present

## 2019-07-24 DIAGNOSIS — M1990 Unspecified osteoarthritis, unspecified site: Secondary | ICD-10-CM | POA: Diagnosis not present

## 2019-07-24 DIAGNOSIS — F17200 Nicotine dependence, unspecified, uncomplicated: Secondary | ICD-10-CM | POA: Diagnosis not present

## 2019-07-24 DIAGNOSIS — C61 Malignant neoplasm of prostate: Secondary | ICD-10-CM | POA: Diagnosis not present

## 2019-07-24 DIAGNOSIS — B1920 Unspecified viral hepatitis C without hepatic coma: Secondary | ICD-10-CM | POA: Diagnosis not present

## 2019-07-24 DIAGNOSIS — F1010 Alcohol abuse, uncomplicated: Secondary | ICD-10-CM | POA: Diagnosis not present

## 2019-07-24 DIAGNOSIS — K219 Gastro-esophageal reflux disease without esophagitis: Secondary | ICD-10-CM | POA: Diagnosis not present

## 2019-07-24 DIAGNOSIS — F1027 Alcohol dependence with alcohol-induced persisting dementia: Secondary | ICD-10-CM | POA: Diagnosis not present

## 2019-08-19 ENCOUNTER — Other Ambulatory Visit: Payer: Self-pay | Admitting: Family Medicine

## 2019-08-19 ENCOUNTER — Other Ambulatory Visit (HOSPITAL_COMMUNITY): Payer: Self-pay | Admitting: Family Medicine

## 2019-08-19 DIAGNOSIS — R221 Localized swelling, mass and lump, neck: Secondary | ICD-10-CM

## 2019-08-19 DIAGNOSIS — Z20828 Contact with and (suspected) exposure to other viral communicable diseases: Secondary | ICD-10-CM | POA: Diagnosis not present

## 2019-08-26 DIAGNOSIS — Z79899 Other long term (current) drug therapy: Secondary | ICD-10-CM | POA: Diagnosis not present

## 2019-08-26 DIAGNOSIS — R972 Elevated prostate specific antigen [PSA]: Secondary | ICD-10-CM | POA: Diagnosis not present

## 2019-08-26 DIAGNOSIS — K219 Gastro-esophageal reflux disease without esophagitis: Secondary | ICD-10-CM | POA: Diagnosis not present

## 2019-08-26 DIAGNOSIS — F1027 Alcohol dependence with alcohol-induced persisting dementia: Secondary | ICD-10-CM | POA: Diagnosis not present

## 2019-08-26 DIAGNOSIS — B182 Chronic viral hepatitis C: Secondary | ICD-10-CM | POA: Diagnosis not present

## 2019-08-26 DIAGNOSIS — F1010 Alcohol abuse, uncomplicated: Secondary | ICD-10-CM | POA: Diagnosis not present

## 2019-08-29 DIAGNOSIS — M1990 Unspecified osteoarthritis, unspecified site: Secondary | ICD-10-CM | POA: Diagnosis not present

## 2019-08-29 DIAGNOSIS — K219 Gastro-esophageal reflux disease without esophagitis: Secondary | ICD-10-CM | POA: Diagnosis not present

## 2019-08-29 DIAGNOSIS — F1010 Alcohol abuse, uncomplicated: Secondary | ICD-10-CM | POA: Diagnosis not present

## 2019-09-11 ENCOUNTER — Other Ambulatory Visit: Payer: Self-pay

## 2019-09-11 ENCOUNTER — Ambulatory Visit (HOSPITAL_COMMUNITY)
Admission: RE | Admit: 2019-09-11 | Discharge: 2019-09-11 | Disposition: A | Discharge status: Home / Self Care | Payer: Medicare HMO | Source: Ambulatory Visit | Attending: Family Medicine | Admitting: Family Medicine

## 2019-09-11 DIAGNOSIS — R221 Localized swelling, mass and lump, neck: Secondary | ICD-10-CM | POA: Diagnosis not present

## 2019-09-11 LAB — POCT I-STAT CREATININE: Creatinine, Ser: 0.8 mg/dL (ref 0.61–1.24)

## 2019-09-11 MED ORDER — IOHEXOL 300 MG/ML  SOLN
75.0000 mL | Freq: Once | INTRAMUSCULAR | Status: AC | PRN
  Administered 2019-09-11: 13:00:00 75 mL via INTRAVENOUS

## 2019-09-23 DIAGNOSIS — R221 Localized swelling, mass and lump, neck: Secondary | ICD-10-CM | POA: Diagnosis not present

## 2019-09-23 DIAGNOSIS — Z20828 Contact with and (suspected) exposure to other viral communicable diseases: Secondary | ICD-10-CM | POA: Diagnosis not present

## 2019-09-23 DIAGNOSIS — F1027 Alcohol dependence with alcohol-induced persisting dementia: Secondary | ICD-10-CM | POA: Diagnosis not present

## 2019-09-23 DIAGNOSIS — M1990 Unspecified osteoarthritis, unspecified site: Secondary | ICD-10-CM | POA: Diagnosis not present

## 2019-09-23 DIAGNOSIS — F1010 Alcohol abuse, uncomplicated: Secondary | ICD-10-CM | POA: Diagnosis not present

## 2019-09-25 DIAGNOSIS — F17210 Nicotine dependence, cigarettes, uncomplicated: Secondary | ICD-10-CM | POA: Diagnosis not present

## 2019-09-25 DIAGNOSIS — R490 Dysphonia: Secondary | ICD-10-CM | POA: Diagnosis not present

## 2019-09-25 DIAGNOSIS — J3801 Paralysis of vocal cords and larynx, unilateral: Secondary | ICD-10-CM | POA: Diagnosis not present

## 2019-09-25 DIAGNOSIS — R599 Enlarged lymph nodes, unspecified: Secondary | ICD-10-CM | POA: Diagnosis not present

## 2019-09-29 ENCOUNTER — Other Ambulatory Visit: Payer: Self-pay | Admitting: Otolaryngology

## 2019-09-29 DIAGNOSIS — R221 Localized swelling, mass and lump, neck: Secondary | ICD-10-CM | POA: Diagnosis not present

## 2019-09-29 DIAGNOSIS — Z923 Personal history of irradiation: Secondary | ICD-10-CM | POA: Diagnosis not present

## 2019-09-29 DIAGNOSIS — R590 Localized enlarged lymph nodes: Secondary | ICD-10-CM

## 2019-09-29 DIAGNOSIS — C61 Malignant neoplasm of prostate: Secondary | ICD-10-CM | POA: Diagnosis not present

## 2019-10-04 DIAGNOSIS — C760 Malignant neoplasm of head, face and neck: Secondary | ICD-10-CM | POA: Insufficient documentation

## 2019-10-04 DIAGNOSIS — C4442 Squamous cell carcinoma of skin of scalp and neck: Secondary | ICD-10-CM | POA: Insufficient documentation

## 2019-10-04 NOTE — Progress Notes (Signed)
[place]  [phone] [phone]  ID: Zachary Gilmore OB: Mar 23, 1956  MR#: [id number]  CLE#:[id number]  Patient Care Team: Carlos American, FNP as PCP - General (Family Medicine)  CHIEF COMPLAINT: Mass of neck, biopsy consistent with carcinoma.  INTERVAL HISTORY: Patient is a 64 year old male who noticed an enlarging lump on his right neck several months ago.  Subsequent imaging and biopsy revealed carcinoma, most likely head neck origin but final pathology is pending.  He otherwise feels well.  He has no neurologic complaints.  He denies any recent fevers or illnesses.  He has a good appetite and denies weight loss.  He has no chest pain, shortness of breath, cough, or hemoptysis.  He denies any nausea, vomiting, constipation, or diarrhea.  He has no urinary complaints.  Patient otherwise feels well and offers no further specific complaints today.  REVIEW OF SYSTEMS:   Review of Systems  Constitutional: Negative.  Negative for fever, malaise/fatigue and weight loss.  Respiratory: Negative.  Negative for cough and shortness of breath.   Cardiovascular: Negative.  Negative for chest pain and leg swelling.  Gastrointestinal: Negative.  Negative for abdominal pain, blood in stool and melena.  Genitourinary: Negative.  Negative for dysuria.  Musculoskeletal: Negative.  Negative for back pain and neck pain.  Skin: Negative.  Negative for rash.  Neurological: Negative for dizziness, focal weakness, weakness and headaches.  Psychiatric/Behavioral: Negative.  The patient is not nervous/anxious.     As per HPI. Otherwise, a complete review of systems is negative.  PAST MEDICAL HISTORY: Past Medical History:  Diagnosis Date  . Alcohol abuse   . Colorectal cancer ([place])   . GERD (gastroesophageal reflux disease)   . Hepatitis   . History of substance abuse ([place])   . Neuropathy   . Osteoarthritis   . Prostate cancer ([place])   . Seizures ([place])    as a child  . Shoulder pain   . Tobacco use     PAST SURGICAL HISTORY: Past Surgical History:  Procedure Laterality Date  . CARDIAC CATHETERIZATION N/A 10/03/2016   Procedure: Left Heart Cath and Coronary Angiography;  Surgeon: Corey Skains, MD;  Location: [place] [facility];  Service: Cardiovascular;  Laterality: N/A;  . COLON SURGERY    . colorectal cancer     "scraped off"   . PROSTATE BIOPSY    . SHOULDER ARTHROSCOPY WITH BICEPS TENDON REPAIR Right 10/03/2016   Procedure: SHOULDER ARTHROSCOPIC LIMITED DEBRIDEMENT  WITH BICEPS TENOLYSIS;  Surgeon: Corky Mull, MD;  Location: [facility] ORS;  Service: Orthopedics;  Laterality: Right;  . Stomach ulcers      FAMILY HISTORY: Family History  Problem Relation Age of Onset  . Heart attack Mother   . Colon cancer Father   . Cancer Father        prostate  . Cancer Brother        prostate    ADVANCED DIRECTIVES (Y/N):  N  HEALTH MAINTENANCE: Social History   Tobacco Use  . Smoking status: Current Every Day Smoker    Packs/day: 0.50    Years: 45.00    Pack years: 22.50    Types: Cigarettes  . Smokeless tobacco: Never Used  Substance Use Topics  . Alcohol use: Yes    Comment: occasional glass of wine  . Drug use: Yes    Types: Marijuana     Colonoscopy:  PAP:  Bone density:  Lipid panel:  No Known Allergies  Current Outpatient Medications  Medication Sig Dispense Refill  . acetaminophen (TYLENOL) 500 MG tablet Take 500 mg by mouth every 6 (six) hours as needed for mild pain.     . Cholecalciferol (D 5000) 5000 units capsule Take 5,000 Units by mouth daily.    [name] donepezil (ARICEPT) 10 MG tablet Take 10 mg by mouth at bedtime.     . gabapentin (NEURONTIN) 300 MG capsule     . naltrexone (DEPADE) 50 MG tablet Take 50 mg by mouth daily.    . naproxen (NAPROSYN) 250 MG tablet Take by mouth 2 (two) times daily with a meal.    . ranitidine (ZANTAC) 150 MG capsule Take 150 mg by mouth 2 (two) times daily.    .  tamsulosin (FLOMAX) 0.4 MG CAPS capsule      No current facility-administered medications for this visit.    OBJECTIVE: Vitals:   10/09/19 1129  BP: 107/65  Pulse: 63  Resp: 18  Temp: (!) 95.9 F (35.5 C)  SpO2: 98%     Body mass index is 20.31 kg/m.    ECOG FS:0 - Asymptomatic  General: Well-developed, well-nourished, no acute distress. Eyes: Pink conjunctiva, anicteric sclera. HEENT: Normocephalic, moist mucous membranes.  Easily palpable right cervical lymphadenopathy. Lungs: No audible wheezing or coughing. Heart: Regular rate and rhythm. Abdomen: Soft, nontender, no obvious distention. Musculoskeletal: No edema, cyanosis, or clubbing. Neuro: Alert, answering all questions appropriately. Cranial nerves grossly intact. Skin: No rashes or petechiae noted. Psych: Normal affect.  LAB RESULTS:  Lab Results  Component Value Date   NA 138 09/26/2016   K 4.1 09/26/2016   CL 100 (L) 09/26/2016   CO2 33 (H) 09/26/2016   GLUCOSE 92 09/26/2016   BUN 8 09/26/2016   CREATININE 0.80 09/11/2019   CALCIUM 9.5 09/26/2016   PROT 7.1 05/25/2016   ALBUMIN 4.2 05/25/2016   AST 22 05/25/2016   ALT 13 05/25/2016   ALKPHOS 106 05/25/2016   BILITOT 0.5 05/25/2016   GFRNONAA >60 09/26/2016   GFRAA >60 09/26/2016    Lab Results  Component Value Date   WBC 9.9 09/26/2016   NEUTROABS 6.8 (H) 09/26/2016   HGB 16.9 09/26/2016   HCT 48.9 09/26/2016   MCV 91.9 09/26/2016   PLT 263 09/26/2016     STUDIES: CT SOFT TISSUE NECK W WO CONTRAST  Result Date: 09/11/2019 CLINICAL DATA:  Right neck mass for 3 months. EXAM: CT NECK WITH AND WITHOUT CONTRAST TECHNIQUE: Multidetector CT imaging of the neck was performed without and with intravenous contrast. CONTRAST:  72mL OMNIPAQUE IOHEXOL 300 MG/ML  SOLN COMPARISON:  None. FINDINGS: Pharynx and larynx: Symmetric pharyngeal soft tissues without a mass identified. Widely patent airway. Unremarkable larynx. Salivary glands: No inflammation,  mass, or stone. Thyroid: Unremarkable. Lymph nodes: 4.6 x 2.2 cm heterogeneously enhancing right level II mass which appears partially cystic or necrotic. No enlarged or suspicious lymph nodes elsewhere in the neck. Vascular: Major vascular structures of the neck appear patent. The right internal jugular vein is compressed by the level II mass. Limited intracranial: Unremarkable. Visualized orbits: Unremarkable. Mastoids and visualized paranasal sinuses: Clear. Skeleton: No suspicious osseous lesion. Bulky anterior vertebral osteophytosis in the mid and lower cervical spine. Upper chest: Mild centrilobular emphysema. Irregular, mildly spiculated, partly solid density in the posterior right upper lobe extending to the major fissure measuring 10 x 7 mm (mean 9 mm, series 5, image 137). Other: Superficial low-density masses measuring 3.2 cm in the right cheek and 1.4 cm in the posterior midline  of the lower neck compatible with sebaceous cysts. IMPRESSION: 1. 4.6 cm right level II mass most consistent with metastatic lymphadenopathy. A primary mucosal lesion is not identified. Recommend ENT referral. 2. 9 mm irregular pulmonary density in the right upper lobe, indeterminate for postinfectious/inflammatory scarring or a small primary lung cancer. A metastasis is less likely. 3. Aortic Atherosclerosis (ICD10-I70.0) and Emphysema (ICD10-J43.9). Electronically Signed   By: Logan Bores M.D.   On: 09/11/2019 17:12   [place] CORE BIOPSY (LYMPH NODES)  Result Date: 10/08/2019 INDICATION: 64 year old with large right neck nodal mass. Tissue diagnosis is needed. EXAM: ULTRASOUND-GUIDED CORE BIOPSY OF RIGHT NECK MASS MEDICATIONS: None. ANESTHESIA/SEDATION: Moderate (conscious) sedation was employed during this procedure. A total of Versed 2.0 mg and Fentanyl 100 mcg was administered intravenously. Moderate Sedation Time: 14 minutes. The patient's level of consciousness and vital signs were monitored continuously by radiology  nursing throughout the procedure under my direct supervision. FLUOROSCOPY TIME:  None COMPLICATIONS: None immediate. PROCEDURE: Informed written consent was obtained from the patient after a thorough discussion of the procedural risks, benefits and alternatives. All questions were addressed. A timeout was performed prior to the initiation of the procedure. The right neck mass was identified with ultrasound. The skin was prepped with chlorhexidine and sterile field was created. Skin was anesthetized with 1% lidocaine. A small incision was made. Using ultrasound guidance, 18 gauge core needle was directed into the nodal mass. Total of 6 core biopsies were obtained. Specimens collected on a Telfa pad with saline. Bandage placed over the puncture site. FINDINGS: Hypoechoic heterogeneous mass in the right upper neck below the right mandibular angle. Lesion measures 4.5 x 1.9 x 5.1 cm. Total of 6 core biopsies were obtained. No significant bleeding or hematoma formation following the biopsy. IMPRESSION: Ultrasound-guided core biopsies of the right neck nodal mass. Electronically Signed   By: Markus Daft M.D.   On: 10/08/2019 11:43    ASSESSMENT: Mass of neck, biopsy consistent with carcinoma.  PLAN:    1. Mass of neck: CT results from October 08, 2019 reviewed independently and reported as above.  Preliminary biopsy consistent with carcinoma, although final pathology is not yet available.  Given patient's tobacco and alcohol history, this is most likely head and neck origin.  Will get PET scan for further evaluation and to complete the staging work-up.  Patient will then return to clinic 1 to 2 days after his PET scan for further evaluation, radiation oncology consultation, and treatment planning.  I spent a total of 60 minutes reviewing chart data, face-to-face evaluation with the patient, counseling and coordination of care as detailed above.   Patient expressed understanding and was in agreement with this  plan. He also understands that He can call clinic at any time with any questions, concerns, or complaints.   Cancer Staging No matching staging information was found for the patient.  Lloyd Huger, MD   10/10/2019 2:35 PM

## 2019-10-07 ENCOUNTER — Other Ambulatory Visit: Payer: Self-pay | Admitting: Radiology

## 2019-10-08 ENCOUNTER — Ambulatory Visit
Admission: RE | Admit: 2019-10-08 | Discharge: 2019-10-08 | Disposition: A | Discharge status: Home / Self Care | Payer: Medicare HMO | Source: Ambulatory Visit | Attending: Otolaryngology | Admitting: Otolaryngology

## 2019-10-08 ENCOUNTER — Other Ambulatory Visit: Payer: Self-pay

## 2019-10-08 DIAGNOSIS — Z791 Long term (current) use of non-steroidal anti-inflammatories (NSAID): Secondary | ICD-10-CM | POA: Diagnosis not present

## 2019-10-08 DIAGNOSIS — Z8546 Personal history of malignant neoplasm of prostate: Secondary | ICD-10-CM | POA: Diagnosis not present

## 2019-10-08 DIAGNOSIS — J439 Emphysema, unspecified: Secondary | ICD-10-CM | POA: Diagnosis not present

## 2019-10-08 DIAGNOSIS — K219 Gastro-esophageal reflux disease without esophagitis: Secondary | ICD-10-CM | POA: Insufficient documentation

## 2019-10-08 DIAGNOSIS — R590 Localized enlarged lymph nodes: Secondary | ICD-10-CM | POA: Diagnosis not present

## 2019-10-08 DIAGNOSIS — F17210 Nicotine dependence, cigarettes, uncomplicated: Secondary | ICD-10-CM | POA: Insufficient documentation

## 2019-10-08 DIAGNOSIS — M1990 Unspecified osteoarthritis, unspecified site: Secondary | ICD-10-CM | POA: Diagnosis not present

## 2019-10-08 DIAGNOSIS — Z800 Family history of malignant neoplasm of digestive organs: Secondary | ICD-10-CM | POA: Insufficient documentation

## 2019-10-08 DIAGNOSIS — Z8042 Family history of malignant neoplasm of prostate: Secondary | ICD-10-CM | POA: Diagnosis not present

## 2019-10-08 DIAGNOSIS — Z79899 Other long term (current) drug therapy: Secondary | ICD-10-CM | POA: Insufficient documentation

## 2019-10-08 DIAGNOSIS — R221 Localized swelling, mass and lump, neck: Secondary | ICD-10-CM | POA: Diagnosis not present

## 2019-10-08 DIAGNOSIS — F1021 Alcohol dependence, in remission: Secondary | ICD-10-CM | POA: Insufficient documentation

## 2019-10-08 DIAGNOSIS — I700 Atherosclerosis of aorta: Secondary | ICD-10-CM | POA: Insufficient documentation

## 2019-10-08 DIAGNOSIS — R229 Localized swelling, mass and lump, unspecified: Secondary | ICD-10-CM | POA: Diagnosis not present

## 2019-10-08 DIAGNOSIS — Z85038 Personal history of other malignant neoplasm of large intestine: Secondary | ICD-10-CM | POA: Insufficient documentation

## 2019-10-08 MED ORDER — SODIUM CHLORIDE 0.9 % IV SOLN: INTRAVENOUS | Status: DC

## 2019-10-08 MED ORDER — FENTANYL CITRATE (PF) 100 MCG/2ML IJ SOLN
INTRAMUSCULAR | Status: AC
  Filled 2019-10-08: qty 2

## 2019-10-08 MED ORDER — FENTANYL CITRATE (PF) 100 MCG/2ML IJ SOLN
INTRAMUSCULAR | Status: AC | PRN
  Administered 2019-10-08: 50 ug via INTRAVENOUS

## 2019-10-08 MED ORDER — MIDAZOLAM HCL 2 MG/2ML IJ SOLN
INTRAMUSCULAR | Status: AC
  Filled 2019-10-08: qty 2

## 2019-10-08 MED ORDER — MIDAZOLAM HCL 2 MG/2ML IJ SOLN
INTRAMUSCULAR | Status: AC | PRN
  Administered 2019-10-08: 1 mg via INTRAVENOUS

## 2019-10-08 NOTE — Discharge Instructions (Signed)
Needle Biopsy, Care After This sheet gives you information about how to care for yourself after your procedure. Your health care provider may also give you more specific instructions. If you have problems or questions, contact your health care provider. What can I expect after the procedure? After the procedure, it is common to have soreness, bruising, or mild pain at the puncture site. This should go away in a few days. Follow these instructions at home: Needle insertion site care   Wash your hands with soap and water before you change your bandage (dressing). If you cannot use soap and water, use hand sanitizer.  Follow instructions from your health care provider about how to take care of your puncture site. This includes: ? When and how to change your dressing. ? When to remove your dressing.  Check your puncture site every day for signs of infection. Check for: ? Redness, swelling, or pain. ? Fluid or blood. ? Pus or a bad smell. ? Warmth. General instructions  Return to your normal activities as told by your health care provider. Ask your health care provider what activities are safe for you.  Do not take baths, swim, or use a hot tub until your health care provider approves. Ask your health care provider if you may take showers. You may only be allowed to take sponge baths.  Take over-the-counter and prescription medicines only as told by your health care provider.  Keep all follow-up visits as told by your health care provider. This is important. Contact a health care provider if:  You have a fever.  You have redness, swelling, or pain at the puncture site that lasts longer than a few days.  You have fluid, blood, or pus coming from your puncture site.  Your puncture site feels warm to the touch. Get help right away if:  You have severe bleeding from the puncture site. Summary  After the procedure, it is common to have soreness, bruising, or mild pain at the puncture  site. This should go away in a few days.  Check your puncture site every day for signs of infection, such as redness, swelling, or pain.  Get help right away if you have severe bleeding from your puncture site. This information is not intended to replace advice given to you by your health care provider. Make sure you discuss any questions you have with your health care provider. Document Revised: 11/09/2017 Document Reviewed: 09/10/2017 Elsevier Patient Education  2020 Elsevier Inc.  

## 2019-10-08 NOTE — Consult Note (Signed)
Chief Complaint: Patient was seen in consultation today for ultrasound-guided biopsy.  Referring Physician(s): Bennett,Paul  Patient Status: [facility] - Out-pt  History of Present Illness: Zachary Gilmore is a 64 y.o. male with a right neck mass and recent CT imaging demonstrates suspicious lymphadenopathy along the right side of the neck.  Past medical history is significant for history of colon and prostate cancer.  Patient smokes a half a pack per day.  He denies shortness of breath or chest pain.  No abdominal symptoms.  He reports no history of COVID-19 infection or exposure to COVID-19.  Past Medical History:  Diagnosis Date  . Alcohol abuse   . Colorectal cancer ([place])   . GERD (gastroesophageal reflux disease)   . Hepatitis   . History of substance abuse ([place])   . Neuropathy   . Osteoarthritis   . Prostate cancer ([place])   . Seizures ([place])    as a child  . Shoulder pain   . Tobacco use     Past Surgical History:  Procedure Laterality Date  . CARDIAC CATHETERIZATION N/A 10/03/2016   Procedure: Left Heart Cath and Coronary Angiography;  Surgeon: Corey Skains, MD;  Location: [facility];  Service: Cardiovascular;  Laterality: N/A;  . COLON SURGERY    . colorectal cancer     "scraped off"   . PROSTATE BIOPSY    . SHOULDER ARTHROSCOPY WITH BICEPS TENDON REPAIR Right 10/03/2016   Procedure: SHOULDER ARTHROSCOPIC LIMITED DEBRIDEMENT  WITH BICEPS TENOLYSIS;  Surgeon: Corky Mull, MD;  Location: [facility];  Service: Orthopedics;  Laterality: Right;  . Stomach ulcers      Allergies: Patient has no known allergies.  Medications: Prior to Admission medications   Medication Sig Start Date End Date Taking? Authorizing Provider  Cholecalciferol (D 5000) 5000 units capsule Take 5,000 Units by mouth daily.   Yes [provider]  donepezil (ARICEPT) 10 MG tablet Take 10 mg by mouth at bedtime.  08/04/16  Yes [provider]  naltrexone (DEPADE) 50 MG  tablet Take 50 mg by mouth daily.   Yes [provider]  naproxen (NAPROSYN) 250 MG tablet Take by mouth 2 (two) times daily with a meal.   Yes [provider]  ranitidine (ZANTAC) 150 MG capsule Take 150 mg by mouth 2 (two) times daily.   Yes [provider]  acetaminophen (TYLENOL) 500 MG tablet Take 500 mg by mouth every 6 (six) hours as needed for mild pain.     [provider]  oxyCODONE (ROXICODONE) 5 MG immediate release tablet Take 1-2 tablets (5-10 mg total) by mouth every 4 (four) hours as needed for severe pain. Patient not taking: Reported on 10/03/2017 10/03/16   Poggi, Marshall Cork, MD     Family History  Problem Relation Age of Onset  . Heart attack Mother   . Colon cancer Father   . Cancer Father        prostate  . Cancer Brother        prostate    Social History   Socioeconomic History  . Marital status: Single    Spouse name: Not on file  . Number of children: Not on file  . Years of education: Not on file  . Highest education level: Not on file  Occupational History    Comment: unemployed  Tobacco Use  . Smoking status: Current Every Day Smoker    Packs/day: 0.50    Years: 45.00    Pack  years: 22.50    Types: Cigarettes  . Smokeless tobacco: Never Used  Substance and Sexual Activity  . Alcohol use: Yes    Comment: occasional glass of wine  . Drug use: Yes    Types: Marijuana  . Sexual activity: Never  Other Topics Concern  . Not on file  Social History Narrative   Lives at [facility] in [place] close to [place]. Reports living at [facility] for 22 plus years.    Social Determinants of Health   Financial Resource Strain:   . Difficulty of Paying Living Expenses: Not on file  Food Insecurity:   . Worried About Charity fundraiser in the Last Year: Not on file  . Ran Out of Food in the Last Year: Not on file  Transportation Needs:   . Lack of Transportation (Medical): Not on file  . Lack of Transportation  (Non-Medical): Not on file  Physical Activity:   . Days of Exercise per Week: Not on file  . Minutes of Exercise per Session: Not on file  Stress:   . Feeling of Stress : Not on file  Social Connections:   . Frequency of Communication with Friends and Family: Not on file  . Frequency of Social Gatherings with Friends and Family: Not on file  . Attends Religious Services: Not on file  . Active Member of Clubs or Organizations: Not on file  . Attends Archivist Meetings: Not on file  . Marital Status: Not on file      Review of Systems  Constitutional: Negative for chills and fever.  HENT:       Right neck mass  Respiratory: Negative.   Cardiovascular: Negative.   Gastrointestinal: Negative.   Genitourinary: Negative.     Vital Signs: BP (!) 151/82   Pulse 66   Temp 98.6 F (37 C) (Oral)   Resp 18   Ht 5\' 9"  (1.753 m)   Wt 72 kg   SpO2 98%   BMI 23.45 kg/m   Physical Exam Constitutional:      Appearance: Normal appearance. He is not ill-appearing.  HENT:     Mouth/Throat:     Mouth: Mucous membranes are moist.     Pharynx: Oropharynx is clear.  Cardiovascular:     Rate and Rhythm: Normal rate and regular rhythm.  Pulmonary:     Effort: Pulmonary effort is normal.     Breath sounds: Normal breath sounds.  Skin:    Comments: Fullness in the upper right neck beneath the right mandibular angle.  Findings correspond with the known lymphadenopathy.  Neurological:     Mental Status: He is alert.     Imaging: CT SOFT TISSUE NECK W WO CONTRAST  Result Date: 09/11/2019 CLINICAL DATA:  Right neck mass for 3 months. EXAM: CT NECK WITH AND WITHOUT CONTRAST TECHNIQUE: Multidetector CT imaging of the neck was performed without and with intravenous contrast. CONTRAST:  79mL OMNIPAQUE IOHEXOL 300 MG/ML  SOLN COMPARISON:  None. FINDINGS: Pharynx and larynx: Symmetric pharyngeal soft tissues without a mass identified. Widely patent airway. Unremarkable larynx.  Salivary glands: No inflammation, mass, or stone. Thyroid: Unremarkable. Lymph nodes: 4.6 x 2.2 cm heterogeneously enhancing right level II mass which appears partially cystic or necrotic. No enlarged or suspicious lymph nodes elsewhere in the neck. Vascular: Major vascular structures of the neck appear patent. The right internal jugular vein is compressed by the level II mass. Limited intracranial: Unremarkable. Visualized orbits: Unremarkable. Mastoids and visualized paranasal sinuses:  Clear. Skeleton: No suspicious osseous lesion. Bulky anterior vertebral osteophytosis in the mid and lower cervical spine. Upper chest: Mild centrilobular emphysema. Irregular, mildly spiculated, partly solid density in the posterior right upper lobe extending to the major fissure measuring 10 x 7 mm (mean 9 mm, series 5, image 137). Other: Superficial low-density masses measuring 3.2 cm in the right cheek and 1.4 cm in the posterior midline of the lower neck compatible with sebaceous cysts. IMPRESSION: 1. 4.6 cm right level II mass most consistent with metastatic lymphadenopathy. A primary mucosal lesion is not identified. Recommend ENT referral. 2. 9 mm irregular pulmonary density in the right upper lobe, indeterminate for postinfectious/inflammatory scarring or a small primary lung cancer. A metastasis is less likely. 3. Aortic Atherosclerosis (ICD10-I70.0) and Emphysema (ICD10-J43.9). Electronically Signed   By: Logan Bores M.D.   On: 09/11/2019 17:12    Labs:  CBC: No results for input(s): WBC, HGB, HCT, PLT in the last 8760 hours.  COAGS: No results for input(s): INR, APTT in the last 8760 hours.  BMP: Recent Labs    09/11/19 1305  CREATININE 0.80    LIVER FUNCTION TESTS: No results for input(s): BILITOT, AST, ALT, ALKPHOS, PROT, ALBUMIN in the last 8760 hours.  TUMOR MARKERS: No results for input(s): AFPTM, CEA, CA199, CHROMGRNA in the last 8760 hours.  Assessment and Plan:  64 year old smoker with  suspicious lymphadenopathy in the right upper neck.  Tissue diagnosis is needed.  I have reviewed the neck CT and the patient is a candidate for ultrasound-guided biopsy.  Risks and benefits of ultrasound-guided right neck biopsy was discussed with the patient and/or patient's family including, but not limited to bleeding, infection, damage to adjacent structures or low yield requiring additional tests.  All of the questions were answered and there is agreement to proceed.  Consent signed and in chart.    Thank you for this interesting consult.  I greatly enjoyed meeting Zachary Gilmore and look forward to participating in their care.  A copy of this report was sent to the requesting provider on this date.  Electronically Signed: Burman Riis, MD 10/08/2019, 10:32 AM   I spent a total of  15 Minutes   in face to face in clinical consultation, greater than 50% of which was counseling/coordinating care for ultrasound-guided neck biopsy.

## 2019-10-08 NOTE — Procedures (Signed)
Interventional Radiology Procedure:   Indications: Right neck mass/lymphadenopathy  Procedure: US guided core biopsy  Findings: Large nodal mass, 6 cores obtained.   Complications: None     EBL: less than 10 ml  Plan: Discharge to home in 1 hour.   Ayssa Bentivegna R. Anselm Pancoast, MD  Pager: [phone]

## 2019-10-09 ENCOUNTER — Encounter: Payer: Self-pay | Admitting: Oncology

## 2019-10-09 NOTE — Progress Notes (Signed)
Pt called to go over information related to first visit at [facility]. Questions and concerns gone over. Pt has no specific questions or concerns at this time.

## 2019-10-10 ENCOUNTER — Encounter: Payer: Self-pay | Admitting: Oncology

## 2019-10-10 ENCOUNTER — Inpatient Hospital Stay: Payer: Medicare HMO | Attending: Oncology | Admitting: Oncology

## 2019-10-10 ENCOUNTER — Other Ambulatory Visit: Payer: Self-pay

## 2019-10-10 DIAGNOSIS — R220 Localized swelling, mass and lump, head: Secondary | ICD-10-CM | POA: Diagnosis not present

## 2019-10-10 DIAGNOSIS — Z79899 Other long term (current) drug therapy: Secondary | ICD-10-CM | POA: Insufficient documentation

## 2019-10-10 DIAGNOSIS — I700 Atherosclerosis of aorta: Secondary | ICD-10-CM | POA: Diagnosis not present

## 2019-10-10 DIAGNOSIS — J984 Other disorders of lung: Secondary | ICD-10-CM | POA: Diagnosis not present

## 2019-10-10 DIAGNOSIS — C3411 Malignant neoplasm of upper lobe, right bronchus or lung: Secondary | ICD-10-CM | POA: Diagnosis not present

## 2019-10-10 DIAGNOSIS — Z8249 Family history of ischemic heart disease and other diseases of the circulatory system: Secondary | ICD-10-CM | POA: Diagnosis not present

## 2019-10-10 DIAGNOSIS — F17210 Nicotine dependence, cigarettes, uncomplicated: Secondary | ICD-10-CM

## 2019-10-10 DIAGNOSIS — J439 Emphysema, unspecified: Secondary | ICD-10-CM | POA: Diagnosis not present

## 2019-10-10 DIAGNOSIS — Z8546 Personal history of malignant neoplasm of prostate: Secondary | ICD-10-CM | POA: Insufficient documentation

## 2019-10-10 DIAGNOSIS — Z8042 Family history of malignant neoplasm of prostate: Secondary | ICD-10-CM | POA: Diagnosis not present

## 2019-10-10 DIAGNOSIS — Z800 Family history of malignant neoplasm of digestive organs: Secondary | ICD-10-CM | POA: Diagnosis not present

## 2019-10-10 DIAGNOSIS — R221 Localized swelling, mass and lump, neck: Secondary | ICD-10-CM | POA: Diagnosis not present

## 2019-10-13 ENCOUNTER — Other Ambulatory Visit: Payer: Self-pay | Admitting: Oncology

## 2019-10-13 ENCOUNTER — Other Ambulatory Visit: Payer: Self-pay | Admitting: Anatomic Pathology & Clinical Pathology

## 2019-10-13 LAB — SURGICAL PATHOLOGY

## 2019-10-17 NOTE — Progress Notes (Signed)
[facility]  [phone] [phone]) [phone]  ID: Zachary Gilmore OB: 1955/11/08  MR#: [id number]  QIH#:[id number]  Patient Care Team: Carlos American, FNP as PCP - General (Family Medicine)  I connected with Zachary Gilmore on 10/22/19 at  9:45 AM EST by video enabled telemedicine visit and verified that I am speaking with the correct person using two identifiers.   I discussed the limitations, risks, security and privacy concerns of performing an evaluation and management service by telemedicine and the availability of in-person appointments. I also discussed with the patient that there may be a patient responsible charge related to this service. The patient expressed understanding and agreed to proceed.   Other persons participating in the visit and their role in the encounter: Patient, MD.  Patient's location: Home. Provider's location: Clinic.  CHIEF COMPLAINT: Squamous cell carcinoma of head and neck  INTERVAL HISTORY: Patient agreed to video enabled telemedicine visit for further evaluation and discussion of his PET scan results.  He currently feels well and is asymptomatic.  The mass on his neck is not painful and he denies any dysphagia.  He has no neurologic complaints.  He denies any recent fevers or illnesses.  He has a good appetite and denies weight loss.  He has no chest pain, shortness of breath, cough, or hemoptysis.  He denies any nausea, vomiting, constipation, or diarrhea.  He has no urinary complaints.  Patient offers no specific complaints today.  REVIEW OF SYSTEMS:   Review of Systems  Constitutional: Negative.  Negative for fever, malaise/fatigue and weight loss.  Respiratory: Negative.  Negative for cough and shortness of breath.   Cardiovascular: Negative.  Negative for chest pain and leg swelling.  Gastrointestinal: Negative.  Negative for abdominal pain, blood in stool and melena.  Genitourinary: Negative.  Negative for  dysuria.  Musculoskeletal: Negative.  Negative for back pain and neck pain.  Skin: Negative.  Negative for rash.  Neurological: Negative for dizziness, focal weakness, weakness and headaches.  Psychiatric/Behavioral: Negative.  The patient is not nervous/anxious.     As per HPI. Otherwise, a complete review of systems is negative.  PAST MEDICAL HISTORY: Past Medical History:  Diagnosis Date  . Alcohol abuse   . Colorectal cancer ([place])   . GERD (gastroesophageal reflux disease)   . Hepatitis   . History of substance abuse ([place])   . Neuropathy   . Osteoarthritis   . Prostate cancer ([place])   . Seizures ([place])    as a child  . Shoulder pain   . Tobacco use     PAST SURGICAL HISTORY: Past Surgical History:  Procedure Laterality Date  . CARDIAC CATHETERIZATION N/A 10/03/2016   Procedure: Left Heart Cath and Coronary Angiography;  Surgeon: Corey Skains, MD;  Location: [facility];  Service: Cardiovascular;  Laterality: N/A;  . COLON SURGERY    . colorectal cancer     "scraped off"   . PROSTATE BIOPSY    . SHOULDER ARTHROSCOPY WITH BICEPS TENDON REPAIR Right 10/03/2016   Procedure: SHOULDER ARTHROSCOPIC LIMITED DEBRIDEMENT  WITH BICEPS TENOLYSIS;  Surgeon: Corky Mull, MD;  Location: [facility];  Service: Orthopedics;  Laterality: Right;  . Stomach ulcers      FAMILY HISTORY: Family History  Problem Relation Age of Onset  . Heart attack Mother   . Colon cancer Father   . Cancer Father        prostate  . Cancer Brother  prostate    ADVANCED DIRECTIVES (Y/N):  N  HEALTH MAINTENANCE: Social History   Tobacco Use  . Smoking status: Current Every Day Smoker    Packs/day: 0.50    Years: 45.00    Pack years: 22.50    Types: Cigarettes  . Smokeless tobacco: Never Used  Substance Use Topics  . Alcohol use: Yes    Comment: occasional glass of wine  . Drug use: Yes    Types: Marijuana     Colonoscopy:  PAP:  Bone density:  Lipid panel:  No Known  Allergies  Current Outpatient Medications  Medication Sig Dispense Refill  . acetaminophen (TYLENOL) 500 MG tablet Take 500 mg by mouth every 6 (six) hours as needed for mild pain.     . Cholecalciferol (D 5000) 5000 units capsule Take 5,000 Units by mouth daily.    [name] donepezil (ARICEPT) 10 MG tablet Take 10 mg by mouth at bedtime.     . gabapentin (NEURONTIN) 300 MG capsule     . naltrexone (DEPADE) 50 MG tablet Take 50 mg by mouth daily.    . naproxen (NAPROSYN) 250 MG tablet Take by mouth 2 (two) times daily with a meal.    . ranitidine (ZANTAC) 150 MG capsule Take 150 mg by mouth 2 (two) times daily.    . tamsulosin (FLOMAX) 0.4 MG CAPS capsule      No current facility-administered medications for this visit.    OBJECTIVE: There were no vitals filed for this visit.   There is no height or weight on file to calculate BMI.    ECOG FS:0 - Asymptomatic  General: Well-developed, well-nourished, no acute distress. HEENT: Normocephalic.  Mass on neck easily visualized. Neuro: Alert, answering all questions appropriately. Cranial nerves grossly intact. Psych: Normal affect.  LAB RESULTS:  Lab Results  Component Value Date   NA 138 09/26/2016   K 4.1 09/26/2016   CL 100 (L) 09/26/2016   CO2 33 (H) 09/26/2016   GLUCOSE 92 09/26/2016   BUN 8 09/26/2016   CREATININE 0.80 09/11/2019   CALCIUM 9.5 09/26/2016   PROT 7.1 05/25/2016   ALBUMIN 4.2 05/25/2016   AST 22 05/25/2016   ALT 13 05/25/2016   ALKPHOS 106 05/25/2016   BILITOT 0.5 05/25/2016   GFRNONAA >60 09/26/2016   GFRAA >60 09/26/2016    Lab Results  Component Value Date   WBC 9.9 09/26/2016   NEUTROABS 6.8 (H) 09/26/2016   HGB 16.9 09/26/2016   HCT 48.9 09/26/2016   MCV 91.9 09/26/2016   PLT 263 09/26/2016     STUDIES: [place] CORE BIOPSY (LYMPH NODES)  Result Date: 10/08/2019 INDICATION: 64 year old with large right neck nodal mass. Tissue diagnosis is needed. EXAM: ULTRASOUND-GUIDED CORE BIOPSY OF RIGHT NECK MASS  MEDICATIONS: None. ANESTHESIA/SEDATION: Moderate (conscious) sedation was employed during this procedure. A total of Versed 2.0 mg and Fentanyl 100 mcg was administered intravenously. Moderate Sedation Time: 14 minutes. The patient's level of consciousness and vital signs were monitored continuously by radiology nursing throughout the procedure under my direct supervision. FLUOROSCOPY TIME:  None COMPLICATIONS: None immediate. PROCEDURE: Informed written consent was obtained from the patient after a thorough discussion of the procedural risks, benefits and alternatives. All questions were addressed. A timeout was performed prior to the initiation of the procedure. The right neck mass was identified with ultrasound. The skin was prepped with chlorhexidine and sterile field was created. Skin was anesthetized with 1% lidocaine. A small incision was made. Using ultrasound guidance, 18 gauge  core needle was directed into the nodal mass. Total of 6 core biopsies were obtained. Specimens collected on a Telfa pad with saline. Bandage placed over the puncture site. FINDINGS: Hypoechoic heterogeneous mass in the right upper neck below the right mandibular angle. Lesion measures 4.5 x 1.9 x 5.1 cm. Total of 6 core biopsies were obtained. No significant bleeding or hematoma formation following the biopsy. IMPRESSION: Ultrasound-guided core biopsies of the right neck nodal mass. Electronically Signed   By: Markus Daft M.D.   On: 10/08/2019 11:43    ASSESSMENT: Squamous cell carcinoma of head and neck.  PLAN:    1. Squamous cell carcinoma of head and neck: Biopsy results from October 08, 2019 consistent with squamous cell carcinoma.  PET imaging from February 2021 reviewed independently and reported as above with large lymph node conglomerate on right neck, but no obvious source of primary.  Case was discussed with ENT and determined no further biopsies or surgeries are necessary as this would not alter patient's treatment  moving forward.  Once patient has consultation with radiation oncology, will proceed with weekly cisplatin for 6-8 cycles along with daily XRT.  Return to clinic in approximately 2 weeks to initiate treatment.    I provided 30 minutes of face-to-face video visit time during this encounter which included chart review, counseling, and coordination of care as documented above.   Patient expressed understanding and was in agreement with this plan. He also understands that He can call clinic at any time with any questions, concerns, or complaints.   Cancer Staging No matching staging information was found for the patient.  Zachary Huger, MD   10/17/2019 6:17 AM

## 2019-10-20 ENCOUNTER — Telehealth: Payer: Self-pay | Admitting: Emergency Medicine

## 2019-10-20 ENCOUNTER — Other Ambulatory Visit: Payer: Self-pay

## 2019-10-20 ENCOUNTER — Encounter
Admission: RE | Admit: 2019-10-20 | Discharge: 2019-10-20 | Disposition: A | Discharge status: Home / Self Care | Payer: Medicare HMO | Source: Ambulatory Visit | Attending: Oncology | Admitting: Oncology

## 2019-10-20 DIAGNOSIS — I700 Atherosclerosis of aorta: Secondary | ICD-10-CM | POA: Insufficient documentation

## 2019-10-20 DIAGNOSIS — I2510 Atherosclerotic heart disease of native coronary artery without angina pectoris: Secondary | ICD-10-CM | POA: Diagnosis not present

## 2019-10-20 DIAGNOSIS — R221 Localized swelling, mass and lump, neck: Secondary | ICD-10-CM | POA: Insufficient documentation

## 2019-10-20 DIAGNOSIS — C4442 Squamous cell carcinoma of skin of scalp and neck: Secondary | ICD-10-CM | POA: Diagnosis not present

## 2019-10-20 DIAGNOSIS — R911 Solitary pulmonary nodule: Secondary | ICD-10-CM | POA: Insufficient documentation

## 2019-10-20 LAB — GLUCOSE, CAPILLARY: Glucose-Capillary: 92 mg/dL (ref 70–99)

## 2019-10-20 MED ORDER — FLUDEOXYGLUCOSE F - 18 (FDG) INJECTION
7.1000 | Freq: Once | INTRAVENOUS | Status: AC | PRN
  Administered 2019-10-20: 11:00:00 7.39 via INTRAVENOUS

## 2019-10-20 NOTE — Telephone Encounter (Signed)
Called Marcelo Baldy to let her know that pt is unable to come into the [facility] until the home has been exterminated. [name] said that she didn't realize there were bedbugs, but verbalized understanding. Asked if we could do a virtual visit tomorrow at the original appt time, [name] verbalized that was fine. Text Grace's number for virtual visit.

## 2019-10-20 NOTE — Telephone Encounter (Signed)
Called and spoke with Orvilla Cornwall at [facility] about pt reportedly "being covered in bedbugs" in PET scan. Information on pt's residence, PMH, medications provided. Explained that pt has living will and HCPOA, but unsure who POA is. Information provided on contact information for owner of home Marcelo Baldy) as well as her daughter's contact information. Orvilla Cornwall explained that they would call and let me know if the case was accepted based on information provided, but they would at least go check on the facility even if the case was not accepted.

## 2019-10-21 ENCOUNTER — Ambulatory Visit: Payer: Medicare HMO | Admitting: Radiation Oncology

## 2019-10-21 ENCOUNTER — Inpatient Hospital Stay: Payer: Medicare HMO | Attending: Oncology | Admitting: Oncology

## 2019-10-21 ENCOUNTER — Encounter: Payer: Self-pay | Admitting: Oncology

## 2019-10-21 DIAGNOSIS — C760 Malignant neoplasm of head, face and neck: Secondary | ICD-10-CM

## 2019-10-21 DIAGNOSIS — I700 Atherosclerosis of aorta: Secondary | ICD-10-CM | POA: Insufficient documentation

## 2019-10-21 DIAGNOSIS — Z7189 Other specified counseling: Secondary | ICD-10-CM

## 2019-10-21 DIAGNOSIS — Z79899 Other long term (current) drug therapy: Secondary | ICD-10-CM | POA: Diagnosis not present

## 2019-10-21 DIAGNOSIS — R221 Localized swelling, mass and lump, neck: Secondary | ICD-10-CM | POA: Diagnosis not present

## 2019-10-21 DIAGNOSIS — Z8249 Family history of ischemic heart disease and other diseases of the circulatory system: Secondary | ICD-10-CM | POA: Insufficient documentation

## 2019-10-21 DIAGNOSIS — M1990 Unspecified osteoarthritis, unspecified site: Secondary | ICD-10-CM | POA: Diagnosis not present

## 2019-10-21 DIAGNOSIS — F1010 Alcohol abuse, uncomplicated: Secondary | ICD-10-CM | POA: Diagnosis not present

## 2019-10-21 DIAGNOSIS — Z20828 Contact with and (suspected) exposure to other viral communicable diseases: Secondary | ICD-10-CM | POA: Diagnosis not present

## 2019-10-21 DIAGNOSIS — Z03818 Encounter for observation for suspected exposure to other biological agents ruled out: Secondary | ICD-10-CM | POA: Diagnosis not present

## 2019-10-21 DIAGNOSIS — F17210 Nicotine dependence, cigarettes, uncomplicated: Secondary | ICD-10-CM | POA: Insufficient documentation

## 2019-10-21 DIAGNOSIS — R911 Solitary pulmonary nodule: Secondary | ICD-10-CM | POA: Insufficient documentation

## 2019-10-21 DIAGNOSIS — I2510 Atherosclerotic heart disease of native coronary artery without angina pectoris: Secondary | ICD-10-CM | POA: Insufficient documentation

## 2019-10-21 DIAGNOSIS — Z8042 Family history of malignant neoplasm of prostate: Secondary | ICD-10-CM | POA: Insufficient documentation

## 2019-10-21 DIAGNOSIS — Z800 Family history of malignant neoplasm of digestive organs: Secondary | ICD-10-CM | POA: Insufficient documentation

## 2019-10-21 DIAGNOSIS — Z8546 Personal history of malignant neoplasm of prostate: Secondary | ICD-10-CM | POA: Insufficient documentation

## 2019-10-21 DIAGNOSIS — F1027 Alcohol dependence with alcohol-induced persisting dementia: Secondary | ICD-10-CM | POA: Diagnosis not present

## 2019-10-21 NOTE — Progress Notes (Signed)
Pt prescreened for appt with assistance of Marcelo Baldy. No questions or concerns.

## 2019-10-22 ENCOUNTER — Other Ambulatory Visit: Payer: Self-pay | Admitting: Emergency Medicine

## 2019-10-22 DIAGNOSIS — Z7189 Other specified counseling: Secondary | ICD-10-CM | POA: Insufficient documentation

## 2019-10-22 NOTE — Progress Notes (Signed)
START ON PATHWAY REGIMEN - Head and Neck     A cycle is every 7 days:     Cisplatin   **Always confirm dose/schedule in your pharmacy ordering system**  Patient Characteristics: Occult Primary, Non-Metastatic, Unresectable Disease Classification: Occult Primary Current Disease Status: No Distant Metastases and No Recurrent Disease AJCC T Category: T0 AJCC N Category: pN2a AJCC M Category: M0 AJCC Stage Grouping: IVA Intent of Therapy: Curative Intent, Discussed with Patient

## 2019-10-23 DIAGNOSIS — Z20828 Contact with and (suspected) exposure to other viral communicable diseases: Secondary | ICD-10-CM | POA: Diagnosis not present

## 2019-10-29 ENCOUNTER — Telehealth: Payer: Self-pay | Admitting: Emergency Medicine

## 2019-10-29 NOTE — Telephone Encounter (Signed)
Interesting.  Ok, let's get him back in.

## 2019-10-29 NOTE — Telephone Encounter (Signed)
Reather Littler, APS case worker with [facility] called to give an update. Reports that the address the patient is living at is [address], [place], and he has been living there with the owner Marcelo Baldy since either May or July of 2020. Marcelo Baldy does not currently run a rest home, and the patient is living in her home with her. Per Ronney Lion, the patient is competent and has not been declared incompetent, and we should be asking him any questions, not letting [name] answer for him. When they went out to the residence, the pt was clean, and did not have any bites or marks on him, and there were no bedbugs to be seen. Pt reported that they had prior to his PET scan gone over to the old facility, and thinks that maybe a bedbug had been on his shirt, but he wasn't certain. Per Ronney Lion, she feels that it's safe for the patient to be allowed back in the [facility]. Bettie also said that she wasn't sure that if the patient had a HCPOA or Living Will, but if anyone said anything about it that paperwork should be provided.

## 2019-11-03 ENCOUNTER — Encounter: Payer: Self-pay | Admitting: Oncology

## 2019-11-03 ENCOUNTER — Other Ambulatory Visit: Payer: Self-pay

## 2019-11-03 NOTE — Progress Notes (Signed)
Patient prescreened for appointment. Patient has no concerns or questions.  

## 2019-11-04 ENCOUNTER — Inpatient Hospital Stay: Payer: Medicare HMO | Admitting: Oncology

## 2019-11-04 ENCOUNTER — Ambulatory Visit: Payer: Medicare HMO | Admitting: Radiation Oncology

## 2019-11-05 DIAGNOSIS — R911 Solitary pulmonary nodule: Secondary | ICD-10-CM | POA: Diagnosis not present

## 2019-11-05 DIAGNOSIS — Z720 Tobacco use: Secondary | ICD-10-CM | POA: Diagnosis not present

## 2019-11-05 DIAGNOSIS — J432 Centrilobular emphysema: Secondary | ICD-10-CM | POA: Diagnosis not present

## 2019-11-05 DIAGNOSIS — Z8546 Personal history of malignant neoplasm of prostate: Secondary | ICD-10-CM | POA: Diagnosis not present

## 2019-11-05 DIAGNOSIS — C760 Malignant neoplasm of head, face and neck: Secondary | ICD-10-CM | POA: Diagnosis not present

## 2019-11-05 DIAGNOSIS — R9389 Abnormal findings on diagnostic imaging of other specified body structures: Secondary | ICD-10-CM | POA: Diagnosis not present

## 2019-11-06 ENCOUNTER — Encounter: Payer: Self-pay | Admitting: Radiation Oncology

## 2019-11-06 ENCOUNTER — Inpatient Hospital Stay (HOSPITAL_BASED_OUTPATIENT_CLINIC_OR_DEPARTMENT_OTHER): Payer: Medicare HMO | Admitting: Oncology

## 2019-11-06 ENCOUNTER — Ambulatory Visit
Admission: RE | Admit: 2019-11-06 | Discharge: 2019-11-06 | Disposition: A | Discharge status: Home / Self Care | Payer: Medicare HMO | Source: Ambulatory Visit | Attending: Radiation Oncology | Admitting: Radiation Oncology

## 2019-11-06 ENCOUNTER — Other Ambulatory Visit: Payer: Self-pay

## 2019-11-06 ENCOUNTER — Encounter: Payer: Self-pay | Admitting: Oncology

## 2019-11-06 VITALS — BP 118/77 | HR 72 | Temp 97.8°F | Wt 137.2 lb

## 2019-11-06 VITALS — BP 127/74 | HR 94 | Temp 97.8°F | Resp 16 | Wt 137.1 lb

## 2019-11-06 DIAGNOSIS — I700 Atherosclerosis of aorta: Secondary | ICD-10-CM | POA: Diagnosis not present

## 2019-11-06 DIAGNOSIS — F17210 Nicotine dependence, cigarettes, uncomplicated: Secondary | ICD-10-CM | POA: Insufficient documentation

## 2019-11-06 DIAGNOSIS — Z79899 Other long term (current) drug therapy: Secondary | ICD-10-CM | POA: Insufficient documentation

## 2019-11-06 DIAGNOSIS — R918 Other nonspecific abnormal finding of lung field: Secondary | ICD-10-CM | POA: Diagnosis not present

## 2019-11-06 DIAGNOSIS — R911 Solitary pulmonary nodule: Secondary | ICD-10-CM | POA: Diagnosis not present

## 2019-11-06 DIAGNOSIS — M1990 Unspecified osteoarthritis, unspecified site: Secondary | ICD-10-CM | POA: Insufficient documentation

## 2019-11-06 DIAGNOSIS — C760 Malignant neoplasm of head, face and neck: Secondary | ICD-10-CM

## 2019-11-06 DIAGNOSIS — K219 Gastro-esophageal reflux disease without esophagitis: Secondary | ICD-10-CM | POA: Insufficient documentation

## 2019-11-06 DIAGNOSIS — C61 Malignant neoplasm of prostate: Secondary | ICD-10-CM | POA: Insufficient documentation

## 2019-11-06 DIAGNOSIS — I2510 Atherosclerotic heart disease of native coronary artery without angina pectoris: Secondary | ICD-10-CM | POA: Diagnosis not present

## 2019-11-06 DIAGNOSIS — G629 Polyneuropathy, unspecified: Secondary | ICD-10-CM | POA: Diagnosis not present

## 2019-11-06 DIAGNOSIS — Z8042 Family history of malignant neoplasm of prostate: Secondary | ICD-10-CM | POA: Diagnosis not present

## 2019-11-06 DIAGNOSIS — Z8546 Personal history of malignant neoplasm of prostate: Secondary | ICD-10-CM | POA: Insufficient documentation

## 2019-11-06 DIAGNOSIS — Z800 Family history of malignant neoplasm of digestive organs: Secondary | ICD-10-CM | POA: Diagnosis not present

## 2019-11-06 DIAGNOSIS — F1010 Alcohol abuse, uncomplicated: Secondary | ICD-10-CM | POA: Insufficient documentation

## 2019-11-06 DIAGNOSIS — Z8249 Family history of ischemic heart disease and other diseases of the circulatory system: Secondary | ICD-10-CM | POA: Diagnosis not present

## 2019-11-06 NOTE — Consult Note (Signed)
NEW PATIENT EVALUATION  Name: Zachary Gilmore  MRN: [id number]  Date:   11/06/2019     DOB: Jan 04, 1956   This 64 y.o. male patient presents to the clinic for initial evaluation of head and neck cancer of unknown primary presenting as a right neck mass squamous cell carcinoma in 64 year old male.  REFERRING PHYSICIAN: Carlos American, FNP  CHIEF COMPLAINT:  Chief Complaint  Patient presents with  . Cancer    Ref Finn neck mass     DIAGNOSIS: The encounter diagnosis was Squamous cell carcinoma of head and neck ([place]).   PREVIOUS INVESTIGATIONS:  PET CT scan reviewed Pathology report reviewed Clinical notes reviewed  HPI: Patient is a 64 year old male who noticed a enlarging right neck mass over the past several months.  Initial CT scan back in December showed a 4.6 cm right level 2 mass consistent with metastatic lymphadenopathy.  Also was noted incidentally was a 9 mm irregular pulmonary density in the right upper lobe.  Patient underwent core biopsy showing poorly differentiated nonkeratinizing squamous cell carcinoma with extensive necrosis.  PET CT scan demonstrated hypermetabolic activity in the nodal conglomerate.  The right upper lobe nodule was not hypermetabolic.  There was faint hypermetabolic [place] in the tonsils but they were symmetric by my review there is some slight hypermetabolic activity in the base of tongue.  Patient is seen today for consideration of radiation with concurrent chemotherapy.  He states he is having no head or neck pain or dysphagia at this time.  Patient is planned for 6 weekly cisplatin along with radiation therapy.  PLANNED TREATMENT REGIMEN: Concurrent chemoradiation  PAST MEDICAL HISTORY:  has a past medical history of Alcohol abuse, Colorectal cancer ([place]), GERD (gastroesophageal reflux disease), Hepatitis, History of substance abuse ([place]), Neuropathy, Osteoarthritis, Prostate cancer ([place]), Seizures ([place]), Shoulder pain, and Tobacco use.     PAST SURGICAL HISTORY:  Past Surgical History:  Procedure Laterality Date  . CARDIAC CATHETERIZATION N/A 10/03/2016   Procedure: Left Heart Cath and Coronary Angiography;  Surgeon: Corey Skains, MD;  Location: [place] [facility];  Service: Cardiovascular;  Laterality: N/A;  . COLON SURGERY    . colorectal cancer     "scraped off"   . PROSTATE BIOPSY    . SHOULDER ARTHROSCOPY WITH BICEPS TENDON REPAIR Right 10/03/2016   Procedure: SHOULDER ARTHROSCOPIC LIMITED DEBRIDEMENT  WITH BICEPS TENOLYSIS;  Surgeon: Corky Mull, MD;  Location: [facility];  Service: Orthopedics;  Laterality: Right;  . Stomach ulcers      FAMILY HISTORY: family history includes Cancer in his brother and father; Colon cancer in his father; Heart attack in his mother.  SOCIAL HISTORY:  reports that he has been smoking cigarettes. He has a 22.50 pack-year smoking history. He has never used smokeless tobacco. He reports current alcohol use. He reports current drug use. Drug: Marijuana.  ALLERGIES: Patient has no known allergies.  MEDICATIONS:  Current Outpatient Medications  Medication Sig Dispense Refill  . acetaminophen (TYLENOL) 500 MG tablet Take 500 mg by mouth every 6 (six) hours as needed for mild pain.     . Cholecalciferol (D 5000) 5000 units capsule Take 5,000 Units by mouth daily.    [name] donepezil (ARICEPT) 10 MG tablet Take 10 mg by mouth at bedtime.     . gabapentin (NEURONTIN) 300 MG capsule     . mirtazapine (REMERON) 7.5 MG tablet     . tamsulosin (FLOMAX) 0.4 MG CAPS capsule     . naltrexone (DEPADE) 50  MG tablet Take 50 mg by mouth daily.    . naproxen (NAPROSYN) 250 MG tablet Take by mouth 2 (two) times daily with a meal.    . ranitidine (ZANTAC) 150 MG capsule Take 150 mg by mouth 2 (two) times daily.     No current facility-administered medications for this encounter.    ECOG PERFORMANCE STATUS:  0 - Asymptomatic  REVIEW OF SYSTEMS: Patient denies any weight loss, fatigue, weakness,  fever, chills or night sweats. Patient denies any loss of vision, blurred vision. Patient denies any ringing  of the ears or hearing loss. No irregular heartbeat. Patient denies heart murmur or history of fainting. Patient denies any chest pain or pain radiating to her upper extremities. Patient denies any shortness of breath, difficulty breathing at night, cough or hemoptysis. Patient denies any swelling in the lower legs. Patient denies any nausea vomiting, vomiting of blood, or coffee ground material in the vomitus. Patient denies any stomach pain. Patient states has had normal bowel movements no significant constipation or diarrhea. Patient denies any dysuria, hematuria or significant nocturia. Patient denies any problems walking, swelling in the joints or loss of balance. Patient denies any skin changes, loss of hair or loss of weight. Patient denies any excessive worrying or anxiety or significant depression. Patient denies any problems with insomnia. Patient denies excessive thirst, polyuria, polydipsia. Patient denies any swollen glands, patient denies easy bruising or easy bleeding. Patient denies any recent infections, allergies or URI. Patient "s visual fields have not changed significantly in recent time.   PHYSICAL EXAM: BP 127/74   Pulse 94   Temp 97.8 F (36.6 C)   Resp 16   Wt 137 lb 1.6 oz (62.2 kg)   SpO2 92%   BMI 20.25 kg/m  Patient has a bulky mass present in the right upper cervical chain.  No of the evidence of neck adenopathy is identified.  Well-developed well-nourished patient in NAD. HEENT reveals PERLA, EOMI, discs not visualized.  Oral cavity is clear. No oral mucosal lesions are identified. Neck is clear without evidence of cervical or supraclavicular adenopathy. Lungs are clear to A&P. Cardiac examination is essentially unremarkable with regular rate and rhythm without murmur rub or thrill. Abdomen is benign with no organomegaly or masses noted. Motor sensory and DTR levels  are equal and symmetric in the upper and lower extremities. Cranial nerves II through XII are grossly intact. Proprioception is intact. No peripheral adenopathy or edema is identified. No motor or sensory levels are noted. Crude visual fields are within normal range.  LABORATORY DATA: Pathology report reviewed    RADIOLOGY RESULTS: CT scans and PET CT scan reviewed compatible with above-stated findings   IMPRESSION: Head and neck cancer squamous cell carcinoma of unknown primary presenting as a right neck mass and 64 year old male  PLAN: At this time I would recommend concurrent chemoradiation I would treat the area of lymph node involvement to 7000 cGy over 7 weeks using IMRT treatment planning and delivery.  We will also treat his remaining lymph nodes to 5400 cGy again using IMRT dose painting technique.  I will include my high-dose region the base of tongue based on the slight hypermetabolic activity there and no other definitive evidence of primary tumor in the head and neck region.  Risks and benefits of treatment including possible dysphagia oral mucositis alteration of taste alteration of blood counts skin reaction fatigue all were reviewed with the patient in detail.  Patient comprehends my treatment plan well.  There  will be extra effort by both professional staff as well as technical staff to coordinate and manage concurrent chemoradiation and ensuing side effects during his treatments. I have personally set up and ordered CT simulation for early next week.  We will coordinate his chemotherapy with medical oncology.  I would like to take this opportunity to thank you for allowing me to participate in the care of your patient.Noreene Filbert, MD

## 2019-11-06 NOTE — Progress Notes (Signed)
Patient does not offer any problems today.  

## 2019-11-07 NOTE — Progress Notes (Signed)
[place]  [phone] [phone]  ID: ARRICK DUTTON OB: Nov 30, 1955  MR#: [id number]  PYP#:[id number]  Patient Care Team: Carlos American, FNP as PCP - General (Family Medicine) Lloyd Huger, MD as Consulting Physician (Hematology and Oncology)  CHIEF COMPLAINT: Stage IVa squamous cell carcinoma of head and neck  INTERVAL HISTORY: Patient returns to clinic today for further evaluation and treatment planning.  He continues to feel well and remains asymptomatic. The mass on his neck is not painful and he denies any dysphagia.  He has no neurologic complaints.  He denies any recent fevers or illnesses.  He has a good appetite and denies weight loss.  He has no chest pain, shortness of breath, cough, or hemoptysis.  He denies any nausea, vomiting, constipation, or diarrhea.  He has no urinary complaints.  Patient offers no specific complaints today.  REVIEW OF SYSTEMS:   Review of Systems  Constitutional: Negative.  Negative for fever, malaise/fatigue and weight loss.  Respiratory: Negative.  Negative for cough and shortness of breath.   Cardiovascular: Negative.  Negative for chest pain and leg swelling.  Gastrointestinal: Negative.  Negative for abdominal pain, blood in stool and melena.  Genitourinary: Negative.  Negative for dysuria.  Musculoskeletal: Negative.  Negative for back pain and neck pain.  Skin: Negative.  Negative for rash.  Neurological: Negative for dizziness, focal weakness, weakness and headaches.  Psychiatric/Behavioral: Negative.  The patient is not nervous/anxious.     As per HPI. Otherwise, a complete review of systems is negative.  PAST MEDICAL HISTORY: Past Medical History:  Diagnosis Date  . Alcohol abuse   . Colorectal cancer ([place])   . GERD (gastroesophageal reflux disease)   . Hepatitis   . History of substance abuse ([place])   . Neuropathy   . Osteoarthritis   . Prostate cancer ([place])   . Seizures  ([place])    as a child  . Shoulder pain   . Tobacco use     PAST SURGICAL HISTORY: Past Surgical History:  Procedure Laterality Date  . CARDIAC CATHETERIZATION N/A 10/03/2016   Procedure: Left Heart Cath and Coronary Angiography;  Surgeon: Corey Skains, MD;  Location: [place] [facility];  Service: Cardiovascular;  Laterality: N/A;  . COLON SURGERY    . colorectal cancer     "scraped off"   . PROSTATE BIOPSY    . SHOULDER ARTHROSCOPY WITH BICEPS TENDON REPAIR Right 10/03/2016   Procedure: SHOULDER ARTHROSCOPIC LIMITED DEBRIDEMENT  WITH BICEPS TENOLYSIS;  Surgeon: Corky Mull, MD;  Location: [facility];  Service: Orthopedics;  Laterality: Right;  . Stomach ulcers      FAMILY HISTORY: Family History  Problem Relation Age of Onset  . Heart attack Mother   . Colon cancer Father   . Cancer Father        prostate  . Cancer Brother        prostate    ADVANCED DIRECTIVES (Y/N):  N  HEALTH MAINTENANCE: Social History   Tobacco Use  . Smoking status: Current Every Day Smoker    Packs/day: 0.50    Years: 45.00    Pack years: 22.50    Types: Cigarettes  . Smokeless tobacco: Never Used  Substance Use Topics  . Alcohol use: Yes    Comment: occasional glass of wine  . Drug use: Yes    Types: Marijuana     Colonoscopy:  PAP:  Bone density:  Lipid panel:  No Known Allergies  Current Outpatient  Medications  Medication Sig Dispense Refill  . acetaminophen (TYLENOL) 500 MG tablet Take 500 mg by mouth every 6 (six) hours as needed for mild pain.     . Cholecalciferol (D 5000) 5000 units capsule Take 5,000 Units by mouth daily.    [name] donepezil (ARICEPT) 10 MG tablet Take 10 mg by mouth at bedtime.     . gabapentin (NEURONTIN) 300 MG capsule     . mirtazapine (REMERON) 7.5 MG tablet     . naltrexone (DEPADE) 50 MG tablet Take 50 mg by mouth daily.    . naproxen (NAPROSYN) 250 MG tablet Take by mouth 2 (two) times daily with a meal.    . ranitidine (ZANTAC) 150 MG capsule Take  150 mg by mouth 2 (two) times daily.    . tamsulosin (FLOMAX) 0.4 MG CAPS capsule      No current facility-administered medications for this visit.    OBJECTIVE: Vitals:   11/06/19 1415  BP: 118/77  Pulse: 72  Temp: 97.8 F (36.6 C)     Body mass index is 20.26 kg/m.    ECOG FS:0 - Asymptomatic  General: Well-developed, well-nourished, no acute distress. Eyes: Pink conjunctiva, anicteric sclera. HEENT: Normocephalic, moist mucous membranes.  Easily palpable right cervical lymph node. Lungs: No audible wheezing or coughing. Heart: Regular rate and rhythm. Abdomen: Soft, nontender, no obvious distention. Musculoskeletal: No edema, cyanosis, or clubbing. Neuro: Alert, answering all questions appropriately. Cranial nerves grossly intact. Skin: No rashes or petechiae noted. Psych: Normal affect.  LAB RESULTS:  Lab Results  Component Value Date   NA 138 09/26/2016   K 4.1 09/26/2016   CL 100 (L) 09/26/2016   CO2 33 (H) 09/26/2016   GLUCOSE 92 09/26/2016   BUN 8 09/26/2016   CREATININE 0.80 09/11/2019   CALCIUM 9.5 09/26/2016   PROT 7.1 05/25/2016   ALBUMIN 4.2 05/25/2016   AST 22 05/25/2016   ALT 13 05/25/2016   ALKPHOS 106 05/25/2016   BILITOT 0.5 05/25/2016   GFRNONAA >60 09/26/2016   GFRAA >60 09/26/2016    Lab Results  Component Value Date   WBC 9.9 09/26/2016   NEUTROABS 6.8 (H) 09/26/2016   HGB 16.9 09/26/2016   HCT 48.9 09/26/2016   MCV 91.9 09/26/2016   PLT 263 09/26/2016     STUDIES: NM PET Image Initial (PI) Skull Base To Thigh  Result Date: 10/20/2019 CLINICAL DATA:  Initial treatment strategy for squamous cell carcinoma in the right neck found on nodal biopsy. EXAM: NUCLEAR MEDICINE PET SKULL BASE TO THIGH TECHNIQUE: 7.4 mCi F-18 FDG was injected intravenously. Full-ring PET imaging was performed from the skull base to thigh after the radiotracer. CT data was obtained and used for attenuation correction and anatomic localization. Fasting blood  glucose: 92 mg/dl COMPARISON:  Multiple exams, including CT neck 09/11/2019 FINDINGS: Mediastinal blood pool activity: SUV max 2.0 Liver activity: SUV max N/A NECK: Right level IIa and level IIb conglomerate nodal mass measuring 2.2 cm in short axis, maximum SUV 14.1, compatible with known malignancy. No definite hypermetabolic mass along the oropharynx, supraglottic, or glottic regions to indicate the site of primary. There is some low-grade but symmetric activity along the lingual tonsils. No additional hypermetabolic adenopathy is identified. Incidental CT findings: Subcutaneous cystic lesions along the right face (image 26/3, measuring 3.1 by 2.3 cm) and along the left posterior neck in the midline (1.6 by 1.2 cm). CHEST: Sub solid 8 mm nodule posteriorly in the right upper lobe on image 91/3 is  unchanged from the CT neck, maximum SUV in this vicinity 0.9. Incidental CT findings: Coronary, aortic arch, and branch vessel atherosclerotic vascular disease. ABDOMEN/PELVIS: Accentuated activity throughout the stomach is likely physiologic, maximum SUV 6.5. Incidental CT findings: Aortoiliac atherosclerotic vascular disease. Prostate fiducials noted. SKELETON: No significant abnormal hypermetabolic activity in this region. Incidental CT findings: Chronic periosteal reaction or spurring along the right femoral shaft on image 328/3, likely long-standing but cause uncertain. There is some left lateral bony bridging between 2 ribs. Cervical and lumbar spondylosis. IMPRESSION: 1. Right level IIa and IIb conglomerate nodal mass 2.2 cm in short axis, maximum SUV 14.1 compatible with malignancy. No definite hypermetabolic primary lesion is identified along the oropharynx, supraglottic, or glottic regions to indicate the site of primary. 2. The sub solid 8 mm nodule posteriorly in the right upper lobe is unchanged, maximum SUV 0.9. This could be postinflammatory or due to low-grade adenocarcinoma. This is highly unlikely to be  the source of the neck adenopathy. Consider surveillance CT of the chest in 12 months time. 3. Other imaging findings of potential clinical significance: Aortic Atherosclerosis (ICD10-I70.0). Coronary atherosclerosis. Prostate fiducials noted. Spurring or chronic periosteal reaction along the right femoral shaft, partially included on today's exam. Electronically Signed   By: Van Clines M.D.   On: 10/20/2019 14:07    ASSESSMENT: Stage IVa squamous cell carcinoma of head and neck.  PLAN:    1.  Stage IVa squamous cell carcinoma of head and neck: Biopsy results from October 08, 2019 consistent with squamous cell carcinoma.  PET imaging from February 2021 reviewed independently and reported as above with large lymph node conglomerate on right neck, but no obvious source of primary.  Case was discussed with ENT and determined no further biopsies or surgeries are necessary as this would not alter patient's treatment moving forward.  Patient was evaluated by radiation oncology today.  Plan to do concurrent chemotherapy with 6-8 weekly cycles of cisplatin along with daily XRT.  Return to clinic on November 20, 2019 to initiate cycle 1 of weekly cisplatin.    I spent a total of 30 minutes reviewing chart data, face-to-face evaluation with the patient, counseling and coordination of care as detailed above.  Patient expressed understanding and was in agreement with this plan. He also understands that He can call clinic at any time with any questions, concerns, or complaints.   Cancer Staging Squamous cell carcinoma of head and neck (HCC) Staging form: Cervical Lymph Nodes and Unknown Primary Tumors of the Head and Neck, AJCC 8th Edition - Clinical stage from 11/07/2019: Stage IVA (cT0, cN2a, cM0) - Signed by Lloyd Huger, MD on 11/07/2019   Lloyd Huger, MD   11/07/2019 3:42 PM

## 2019-11-07 NOTE — Progress Notes (Signed)
This encounter was created in error - please disregard.

## 2019-11-10 ENCOUNTER — Other Ambulatory Visit: Payer: Self-pay

## 2019-11-10 MED ORDER — ONDANSETRON HCL 8 MG PO TABS: 8.0000 mg | ORAL_TABLET | Freq: Two times a day (BID) | ORAL | 1 refills | Status: DC | PRN

## 2019-11-10 MED ORDER — PROCHLORPERAZINE MALEATE 10 MG PO TABS: 10.0000 mg | ORAL_TABLET | Freq: Four times a day (QID) | ORAL | 2 refills | Status: DC | PRN

## 2019-11-10 NOTE — Patient Instructions (Signed)

## 2019-11-11 ENCOUNTER — Other Ambulatory Visit: Payer: Self-pay

## 2019-11-11 ENCOUNTER — Inpatient Hospital Stay (HOSPITAL_BASED_OUTPATIENT_CLINIC_OR_DEPARTMENT_OTHER): Payer: Medicare HMO | Admitting: Oncology

## 2019-11-11 ENCOUNTER — Inpatient Hospital Stay: Payer: Medicare HMO | Attending: Oncology

## 2019-11-11 ENCOUNTER — Ambulatory Visit: Payer: Medicare HMO

## 2019-11-11 DIAGNOSIS — F17210 Nicotine dependence, cigarettes, uncomplicated: Secondary | ICD-10-CM | POA: Diagnosis not present

## 2019-11-11 DIAGNOSIS — M2550 Pain in unspecified joint: Secondary | ICD-10-CM | POA: Diagnosis not present

## 2019-11-11 DIAGNOSIS — J449 Chronic obstructive pulmonary disease, unspecified: Secondary | ICD-10-CM | POA: Insufficient documentation

## 2019-11-11 DIAGNOSIS — Z79899 Other long term (current) drug therapy: Secondary | ICD-10-CM | POA: Insufficient documentation

## 2019-11-11 DIAGNOSIS — I700 Atherosclerosis of aorta: Secondary | ICD-10-CM | POA: Diagnosis not present

## 2019-11-11 DIAGNOSIS — F1290 Cannabis use, unspecified, uncomplicated: Secondary | ICD-10-CM | POA: Insufficient documentation

## 2019-11-11 DIAGNOSIS — Z800 Family history of malignant neoplasm of digestive organs: Secondary | ICD-10-CM | POA: Diagnosis not present

## 2019-11-11 DIAGNOSIS — C760 Malignant neoplasm of head, face and neck: Secondary | ICD-10-CM | POA: Diagnosis not present

## 2019-11-11 DIAGNOSIS — R55 Syncope and collapse: Secondary | ICD-10-CM | POA: Diagnosis not present

## 2019-11-11 DIAGNOSIS — M25512 Pain in left shoulder: Secondary | ICD-10-CM | POA: Insufficient documentation

## 2019-11-11 DIAGNOSIS — Z510 Encounter for antineoplastic radiation therapy: Secondary | ICD-10-CM | POA: Insufficient documentation

## 2019-11-11 DIAGNOSIS — Z7952 Long term (current) use of systemic steroids: Secondary | ICD-10-CM | POA: Insufficient documentation

## 2019-11-11 DIAGNOSIS — I2510 Atherosclerotic heart disease of native coronary artery without angina pectoris: Secondary | ICD-10-CM | POA: Insufficient documentation

## 2019-11-11 DIAGNOSIS — R911 Solitary pulmonary nodule: Secondary | ICD-10-CM | POA: Insufficient documentation

## 2019-11-11 DIAGNOSIS — M1990 Unspecified osteoarthritis, unspecified site: Secondary | ICD-10-CM | POA: Insufficient documentation

## 2019-11-11 DIAGNOSIS — Z8546 Personal history of malignant neoplasm of prostate: Secondary | ICD-10-CM | POA: Diagnosis not present

## 2019-11-11 DIAGNOSIS — Z8042 Family history of malignant neoplasm of prostate: Secondary | ICD-10-CM | POA: Diagnosis not present

## 2019-11-11 DIAGNOSIS — Z8249 Family history of ischemic heart disease and other diseases of the circulatory system: Secondary | ICD-10-CM | POA: Diagnosis not present

## 2019-11-11 DIAGNOSIS — Z5111 Encounter for antineoplastic chemotherapy: Secondary | ICD-10-CM | POA: Insufficient documentation

## 2019-11-11 NOTE — Progress Notes (Signed)
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  Chief complaint/Reason for visit- Initial Meeting for Zachary [facility], preparing for starting chemotherapy  Heme/Onc history:  Oncology History  Squamous cell carcinoma of head and neck ([place])  10/04/2019 Initial Diagnosis   Squamous cell carcinoma of head and neck ([place])   11/07/2019 Cancer Staging   Staging form: Cervical Lymph Nodes and Unknown Primary Tumors of the Head and Neck, AJCC 8th Edition - Clinical stage from 11/07/2019: Stage IVA (cT0, cN2a, cM0) - Signed by Lloyd Huger, MD on 11/07/2019   11/20/2019 -  Chemotherapy   The patient had palonosetron (ALOXI) injection 0.25 mg, 0.25 mg, Intravenous,  Once, 0 of 7 cycles CISplatin (PLATINOL) 70 mg in sodium chloride 0.9 % 250 mL chemo infusion, 40 mg/m2 = 70 mg, Intravenous,  Once, 0 of 7 cycles fosaprepitant (EMEND) 150 mg in sodium chloride 0.9 % 145 mL IVPB, 150 mg, Intravenous,  Once, 0 of 7 cycles  for chemotherapy treatment.      Interval history-Zachary Gil[facility] is a 64 year old male who presents to chemo care clinic today for initial meeting in preparation for starting chemotherapy. I introduced the chemo care clinic and we discussed that the role of the clinic is to assist those who are at an increased risk of emergency room visits and/or complications during the course of chemotherapy treatment. We discussed that the increased risk takes into account factors such as age, performance status, and co-morbidities. We also discussed that for some, this might include barriers to care such as not having a primary  care provider, lack of insurance/transportation, or not being able to afford medications. We discussed that the goal of the program is to help prevent unplanned ER visits and help reduce complications during chemotherapy. We do this by discussing specific risk factors to each individual and identifying ways that we can help improve these risk factors and reduce barriers to care.   ECOG FS:1 - Symptomatic but completely ambulatory  Review of systems- Review of Systems  Constitutional: Negative.  Negative for chills, fever, malaise/fatigue and weight loss.  HENT: Negative for congestion, ear pain and tinnitus.   Eyes: Negative.  Negative for blurred vision and double vision.  Respiratory: Negative.  Negative for cough, sputum production and shortness of breath.   Cardiovascular: Negative.  Negative for chest pain, palpitations and leg swelling.  Gastrointestinal: Negative.  Negative for abdominal pain, constipation, diarrhea, nausea and vomiting.  Genitourinary: Negative for dysuria, frequency and urgency.  Musculoskeletal: Negative for back pain and falls.  Skin: Negative.  Negative for rash.  Neurological: Negative.  Negative for weakness and headaches.  Endo/Heme/Allergies: Negative.  Does not bruise/bleed easily.  Psychiatric/Behavioral: Negative.  Negative for depression. The patient is not nervous/anxious and does not have insomnia.      Current treatment-6-8 cycles of weekly cisplatin along with daily radiation.  Scheduled to begin November 20, 2019.  No Known Allergies  Past Medical History:  Diagnosis Date  . Alcohol abuse   . Colorectal cancer ([place])   . GERD (gastroesophageal reflux disease)   . Hepatitis   . History of substance abuse ([place])   . Neuropathy   .  Osteoarthritis   . Prostate cancer ([place])   . Seizures ([place])    as a child  . Shoulder pain   . Tobacco use     Past Surgical History:  Procedure Laterality Date  . CARDIAC CATHETERIZATION N/A 10/03/2016    Procedure: Left Heart Cath and Coronary Angiography;  Surgeon: Corey Skains, MD;  Location: [facility];  Service: Cardiovascular;  Laterality: N/A;  . COLON SURGERY    . colorectal cancer     "scraped off"   . PROSTATE BIOPSY    . SHOULDER ARTHROSCOPY WITH BICEPS TENDON REPAIR Right 10/03/2016   Procedure: SHOULDER ARTHROSCOPIC LIMITED DEBRIDEMENT  WITH BICEPS TENOLYSIS;  Surgeon: Corky Mull, MD;  Location: [facility];  Service: Orthopedics;  Laterality: Right;  . Stomach ulcers      Social History   Socioeconomic History  . Marital status: Single    Spouse name: Not on file  . Number of children: Not on file  . Years of education: Not on file  . Highest education level: Not on file  Occupational History    Comment: unemployed  Tobacco Use  . Smoking status: Current Every Day Smoker    Packs/day: 0.50    Years: 45.00    Pack years: 22.50    Types: Cigarettes  . Smokeless tobacco: Never Used  Substance and Sexual Activity  . Alcohol use: Yes    Comment: occasional glass of wine  . Drug use: Yes    Types: Marijuana  . Sexual activity: Never  Other Topics Concern  . Not on file  Social History Narrative   Lives at [facility] in [place] close to [place]. Reports living at [facility] for 22 plus years.    Social Determinants of Health   Financial Resource Strain:   . Difficulty of Paying Living Expenses: Not on file  Food Insecurity:   . Worried About Charity fundraiser in the Last Year: Not on file  . Ran Out of Food in the Last Year: Not on file  Transportation Needs:   . Lack of Transportation (Medical): Not on file  . Lack of Transportation (Non-Medical): Not on file  Physical Activity:   . Days of Exercise per Week: Not on file  . Minutes of Exercise per Session: Not on file  Stress:   . Feeling of Stress : Not on file  Social Connections:   . Frequency of Communication with Friends and Family: Not on file  . Frequency of Social Gatherings  with Friends and Family: Not on file  . Attends Religious Services: Not on file  . Active Member of Clubs or Organizations: Not on file  . Attends Archivist Meetings: Not on file  . Marital Status: Not on file  Intimate Partner Violence:   . Fear of Current or Ex-Partner: Not on file  . Emotionally Abused: Not on file  . Physically Abused: Not on file  . Sexually Abused: Not on file    Family History  Problem Relation Age of Onset  . Heart attack Mother   . Colon cancer Father   . Cancer Father        prostate  . Cancer Brother        prostate     Current Outpatient Medications:  .  acetaminophen (TYLENOL) 500 MG tablet, Take 500 mg by mouth every 6 (six) hours as needed for mild pain. , Disp: , Rfl:  .  Cholecalciferol (D 5000) 5000 units capsule,  Take 5,000 Units by mouth daily., Disp: , Rfl:  .  donepezil (ARICEPT) 10 MG tablet, Take 10 mg by mouth at bedtime. , Disp: , Rfl:  .  gabapentin (NEURONTIN) 300 MG capsule, , Disp: , Rfl:  .  mirtazapine (REMERON) 7.5 MG tablet, , Disp: , Rfl:  .  naltrexone (DEPADE) 50 MG tablet, Take 50 mg by mouth daily., Disp: , Rfl:  .  naproxen (NAPROSYN) 250 MG tablet, Take by mouth 2 (two) times daily with a meal., Disp: , Rfl:  .  ondansetron (ZOFRAN) 8 MG tablet, Take 1 tablet (8 mg total) by mouth 2 (two) times daily as needed for nausea or vomiting., Disp: 60 tablet, Rfl: 1 .  prochlorperazine (COMPAZINE) 10 MG tablet, Take 1 tablet (10 mg total) by mouth every 6 (six) hours as needed for nausea or vomiting., Disp: 60 tablet, Rfl: 2 .  ranitidine (ZANTAC) 150 MG capsule, Take 150 mg by mouth 2 (two) times daily., Disp: , Rfl:  .  tamsulosin (FLOMAX) 0.4 MG CAPS capsule, , Disp: , Rfl:   Physical exam: There were no vitals filed for this visit. Physical Exam Constitutional:      Appearance: Normal appearance.  HENT:     Head: Normocephalic and atraumatic.  Eyes:     Pupils: Pupils are equal, round, and reactive to light.    Cardiovascular:     Rate and Rhythm: Normal rate and regular rhythm.     Heart sounds: Normal heart sounds. No murmur.  Pulmonary:     Effort: Pulmonary effort is normal.     Breath sounds: Normal breath sounds. No wheezing.  Abdominal:     General: Bowel sounds are normal. There is no distension.     Palpations: Abdomen is soft.     Tenderness: There is no abdominal tenderness.  Musculoskeletal:        General: Normal range of motion.     Cervical back: Normal range of motion.  Lymphadenopathy:     Head:     Right side of head: Submandibular and tonsillar adenopathy present.     Cervical: Cervical adenopathy present.     Right cervical: Superficial cervical adenopathy present.  Skin:    General: Skin is warm and dry.     Findings: No rash.  Neurological:     Mental Status: He is alert and oriented to person, place, and time.  Psychiatric:        Judgment: Judgment normal.      CMP Latest Ref Rng & Units 09/11/2019  Glucose 65 - 99 mg/dL -  BUN 6 - 20 mg/dL -  Creatinine 0.61 - 1.24 mg/dL 0.80  Sodium 135 - 145 mmol/L -  Potassium 3.5 - 5.1 mmol/L -  Chloride 101 - 111 mmol/L -  CO2 22 - 32 mmol/L -  Calcium 8.9 - 10.3 mg/dL -  Total Protein 6.0 - 8.5 g/dL -  Total Bilirubin 0.0 - 1.2 mg/dL -  Alkaline Phos 39 - 117 IU/L -  AST 0 - 40 IU/L -  ALT 0 - 44 IU/L -   CBC Latest Ref Rng & Units 09/26/2016  WBC 3.8 - 10.6 K/uL 9.9  Hemoglobin 13.0 - 18.0 g/dL 16.9  Hematocrit 40.0 - 52.0 % 48.9  Platelets 150 - 440 K/uL 263    No images are attached to the encounter.  NM PET Image Initial (PI) Skull Base To Thigh  Result Date: 10/20/2019 CLINICAL DATA:  Initial treatment strategy for squamous cell carcinoma  in the right neck found on nodal biopsy. EXAM: NUCLEAR MEDICINE PET SKULL BASE TO THIGH TECHNIQUE: 7.4 mCi F-18 FDG was injected intravenously. Full-ring PET imaging was performed from the skull base to thigh after the radiotracer. CT data was obtained and used for  attenuation correction and anatomic localization. Fasting blood glucose: 92 mg/dl COMPARISON:  Multiple exams, including CT neck 09/11/2019 FINDINGS: Mediastinal blood pool activity: SUV max 2.0 Liver activity: SUV max N/A NECK: Right level IIa and level IIb conglomerate nodal mass measuring 2.2 cm in short axis, maximum SUV 14.1, compatible with known malignancy. No definite hypermetabolic mass along the oropharynx, supraglottic, or glottic regions to indicate the site of primary. There is some low-grade but symmetric activity along the lingual tonsils. No additional hypermetabolic adenopathy is identified. Incidental CT findings: Subcutaneous cystic lesions along the right face (image 26/3, measuring 3.1 by 2.3 cm) and along the left posterior neck in the midline (1.6 by 1.2 cm). CHEST: Sub solid 8 mm nodule posteriorly in the right upper lobe on image 91/3 is unchanged from the CT neck, maximum SUV in this vicinity 0.9. Incidental CT findings: Coronary, aortic arch, and branch vessel atherosclerotic vascular disease. ABDOMEN/PELVIS: Accentuated activity throughout the stomach is likely physiologic, maximum SUV 6.5. Incidental CT findings: Aortoiliac atherosclerotic vascular disease. Prostate fiducials noted. SKELETON: No significant abnormal hypermetabolic activity in this region. Incidental CT findings: Chronic periosteal reaction or spurring along the right femoral shaft on image 328/3, likely long-standing but cause uncertain. There is some left lateral bony bridging between 2 ribs. Cervical and lumbar spondylosis. IMPRESSION: 1. Right level IIa and IIb conglomerate nodal mass 2.2 cm in short axis, maximum SUV 14.1 compatible with malignancy. No definite hypermetabolic primary lesion is identified along the oropharynx, supraglottic, or glottic regions to indicate the site of primary. 2. The sub solid 8 mm nodule posteriorly in the right upper lobe is unchanged, maximum SUV 0.9. This could be postinflammatory  or due to low-grade adenocarcinoma. This is highly unlikely to be the source of the neck adenopathy. Consider surveillance CT of the chest in 12 months time. 3. Other imaging findings of potential clinical significance: Aortic Atherosclerosis (ICD10-I70.0). Coronary atherosclerosis. Prostate fiducials noted. Spurring or chronic periosteal reaction along the right femoral shaft, partially included on today's exam. Electronically Signed   By: Van Clines M.D.   On: 10/20/2019 14:07    Assessment and plan- Patient is a 64 y.o. male who presents to [facility] for initial meeting in preparation for starting chemotherapy for the treatment of squamous cell carcinoma of the head neck.   1. HPI: Patient is a 64 year old male who noticed an enlarging lump on his right neck several months ago.  Initial CT scan back in December showed a 4.6 cm right level 2 mass consistent with metastatic lymphadenopathy.  He also was noted incidentally to have a 9 mm irregular pulmonary density in right upper lobe.  Patient underwent core biopsy showing poorly differentiated Necko keratinizing squamous cell carcinoma with extensive necrosis.  PET scan demonstrated hypermetabolic activity in the nodal conglomerate.  The right upper lobe nodule was not hypermetabolic.  There was faint hypermetabolic [place] in the tonsil but they were symmetric with some slight hypermetabolic activity in the base of the tongue. Case was discussed with ENT and determined no further biopsies or surgery are necessary as this would not alter treatment.  Plan is for weekly cisplatin and daily radiation.  2. Chemo Care Clinic/High Risk for ER/Hospitalization during chemotherapy- We discussed  the role of the chemo care clinic and identified patient specific risk factors. I discussed that patient was identified as high risk primarily based on: Stage of disease and comorbidities  Patient has past medical history positive for: Past Medical History:   Diagnosis Date  . Alcohol abuse   . Colorectal cancer ([place])   . GERD (gastroesophageal reflux disease)   . Hepatitis   . History of substance abuse ([place])   . Neuropathy   . Osteoarthritis   . Prostate cancer ([place])   . Seizures ([place])    as a child  . Shoulder pain   . Tobacco use     Patient has past surgical history positive for: Past Surgical History:  Procedure Laterality Date  . CARDIAC CATHETERIZATION N/A 10/03/2016   Procedure: Left Heart Cath and Coronary Angiography;  Surgeon: Corey Skains, MD;  Location: [facility];  Service: Cardiovascular;  Laterality: N/A;  . COLON SURGERY    . colorectal cancer     "scraped off"   . PROSTATE BIOPSY    . SHOULDER ARTHROSCOPY WITH BICEPS TENDON REPAIR Right 10/03/2016   Procedure: SHOULDER ARTHROSCOPIC LIMITED DEBRIDEMENT  WITH BICEPS TENOLYSIS;  Surgeon: Corky Mull, MD;  Location: [facility];  Service: Orthopedics;  Laterality: Right;  . Stomach ulcers      Based on our high risk symptom management report; this patient has a high risk of ED utilization.  The percentage below indicates how "at risk "  this patient based on the factors in this table within one year.   General Risk Score: 4  Values used to calculate this score:   Points  Metrics      1        Age: [place] [facility] Admissions: 0      0        ED Visits: 0      1        Has Chronic Obstructive Pulmonary Disease: Yes      0        Has Diabetes: No      0        Has Congestive Heart Failure: No      1        Has liver disease: Yes      0        Has Depression: No      0        Current PCP: Carlos American, FNP      1        Has Medicaid: Yes  3. We discussed that social determinants of health may have significant impacts on health and outcomes for cancer patients.  Today we discussed specific social determinants of performance status, alcohol use, depression, financial needs, food insecurity, housing, interpersonal violence, social  connections, stress, tobacco use, and transportation.    After lengthy discussion the following were identified as areas of need: Mr. Loveday expressed some concern for possible gas vouchers and a voucher to [employer].  Patient does not currently work and has limited income.  Will reach out to [facility] to see if he is able to help.  Outpatient services: We discussed options including home based and outpatient services, DME and care program. We discusssed that patients who participate in regular physical activity report fewer negative impacts of cancer and treatments and report less fatigue.   Financial Concerns: We discussed that  living with cancer can create tremendous financial burden.  We discussed options for assistance. I asked that if assistance is needed in affording medications or paying bills to please let us know so that we can provide assistance. We discussed options for food including social services, [employer] ($50 every 2 weeks) and onsite food pantry.  We will also notify [facility] to see if cancer center can provide additional support.  Referral to Social work: Introduced Education officer, museum Elease Etienne and the services he can provide such as support with [employer], cell phone and gas vouchers.   Support groups: We discussed options for support groups at the cancer center. If interested, please notify nurse navigator to enroll. We discussed options for managing stress including healthy eating, exercise as well as participating in no charge counseling services at the cancer center and support groups.  If these are of interest, patient can notify either myself or primary nursing team.We discussed options for management including medications and referral to quit Smart program  Transportation: We discussed options for transportation including acta, paratransit, bus routes, link transit, taxi/uber/lyft, and cancer center [place].  I have notified primary oncology team  who will help assist with arranging Lucianne Lei transportation for appointments when/if needed. We also discussed options for transportation on short notice/acute visits.  Palliative care services: We have palliative care services available in the cancer center to discuss goals of care and advanced care planning.  Please let us know if you have any questions or would like to speak to our palliative nurse practitioner.  Symptom Management Clinic: We discussed our symptom management clinic which is available for acute concerns while receiving treatment such as nausea, vomiting or diarrhea.  We can be reached via telephone at [phone] or through my chart.  We are available for virtual or in person visits on the same day from 830 to 4 PM Monday through Friday. He denies needing specific assistance at this time and He will be followed by Dr. Gary Fleet clinical team.  Plan: Discussed symptom management clinic. Discussed palliative care services. Discussed resources that are available here at the cancer center. Discussed medications and new prescriptions to begin treatment such as anti-nausea or steroids.  E-mail sent to [facility] for possible financial support; gas and food vouchers.   Disposition: RTC on 11/19/19 to begin daily radiation. RTC on 11/20/2019 for radiation treatment and initiation of weekly cisplatin.  Visit Diagnosis 1. Squamous cell carcinoma of head and neck (HCC)     Patient expressed understanding and was in agreement with this plan. He also understands that He can call clinic at any time with any questions, concerns, or complaints.   Greater than 50% was spent in counseling and coordination of care with this patient including but not limited to discussion of the relevant topics above (See A&P) including, but not limited to diagnosis and management of acute and chronic medical conditions.    [facility]  CC: Dr. Grayland Ormond

## 2019-11-12 DIAGNOSIS — F1027 Alcohol dependence with alcohol-induced persisting dementia: Secondary | ICD-10-CM | POA: Diagnosis not present

## 2019-11-12 DIAGNOSIS — R221 Localized swelling, mass and lump, neck: Secondary | ICD-10-CM | POA: Diagnosis not present

## 2019-11-12 DIAGNOSIS — F17200 Nicotine dependence, unspecified, uncomplicated: Secondary | ICD-10-CM | POA: Diagnosis not present

## 2019-11-12 DIAGNOSIS — M1990 Unspecified osteoarthritis, unspecified site: Secondary | ICD-10-CM | POA: Diagnosis not present

## 2019-11-13 DIAGNOSIS — C760 Malignant neoplasm of head, face and neck: Secondary | ICD-10-CM | POA: Diagnosis not present

## 2019-11-13 DIAGNOSIS — Z510 Encounter for antineoplastic radiation therapy: Secondary | ICD-10-CM | POA: Diagnosis not present

## 2019-11-16 NOTE — Progress Notes (Signed)
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  He initiated XRT earlier this week.  He currently feels well and is asymptomatic.  The mass on his neck is not painful and he denies any dysphagia.  He has no neurologic complaints.  He denies any recent fevers or illnesses.  He has a good appetite and denies weight loss.  He has no chest pain, shortness of breath, cough, or hemoptysis.  He denies any nausea, vomiting, constipation, or diarrhea.  He has no urinary complaints.  Patient offers no specific complaints today.  REVIEW OF SYSTEMS:   Review of Systems  Constitutional: Negative.  Negative for fever, malaise/fatigue and weight loss.  Respiratory: Negative.  Negative for cough and shortness of breath.   Cardiovascular: Negative.  Negative for chest pain and leg swelling.  Gastrointestinal: Negative.  Negative for abdominal pain, blood in stool and melena.  Genitourinary: Negative.  Negative for dysuria.  Musculoskeletal: Negative.  Negative for back pain and neck pain.  Skin: Negative.  Negative for rash.  Neurological: Negative for dizziness, focal weakness, weakness and headaches.  Psychiatric/Behavioral: Negative.  The patient is not nervous/anxious.     As per HPI. Otherwise, a complete review of systems is negative.  PAST MEDICAL HISTORY: Past Medical History:  Diagnosis Date  . Alcohol abuse   . Colorectal cancer ([place])   . GERD (gastroesophageal reflux disease)   . Hepatitis   . History of substance abuse ([place])   . Neuropathy   .  Osteoarthritis   . Prostate cancer ([place])   . Seizures ([place])    as a child  . Shoulder pain   . Tobacco use     PAST SURGICAL HISTORY: Past Surgical History:  Procedure Laterality Date  . CARDIAC CATHETERIZATION N/A 10/03/2016   Procedure: Left Heart Cath and Coronary Angiography;  Surgeon: Corey Skains, MD;  Location: [facility];  Service: Cardiovascular;  Laterality: N/A;  . COLON SURGERY    . colorectal cancer     "scraped off"   . PROSTATE BIOPSY    . SHOULDER ARTHROSCOPY WITH BICEPS TENDON REPAIR Right 10/03/2016   Procedure: SHOULDER ARTHROSCOPIC LIMITED DEBRIDEMENT  WITH BICEPS TENOLYSIS;  Surgeon: Corky Mull, MD;  Location: [facility];  Service: Orthopedics;  Laterality: Right;  . Stomach ulcers      FAMILY HISTORY: Family History  Problem Relation Age of Onset  . Heart attack Mother   . Colon cancer Father   . Cancer Father        prostate  . Cancer Brother        prostate    ADVANCED DIRECTIVES (Y/N):  N  HEALTH MAINTENANCE: Social History   Tobacco Use  . Smoking status: Current Every Day Smoker    Packs/day: 0.50    Years: 45.00    Pack years: 22.50    Types: Cigarettes  . Smokeless tobacco: Never Used  Substance Use Topics  . Alcohol use: Yes    Comment: occasional glass of wine  . Drug use: Yes    Types: Marijuana     Colonoscopy:  PAP:  Bone density:  Lipid panel:  No Known Allergies  Current Outpatient Medications  Medication Sig Dispense Refill  . Cholecalciferol (D 5000) 5000 units capsule Take 5,000 Units by mouth daily.    [name] docusate sodium (COLACE) 100 MG capsule Take 100 mg by mouth 2 (two) times daily.    [name] donepezil (ARICEPT) 10 MG tablet Take 10 mg by mouth at bedtime.     . gabapentin (NEURONTIN) 300 MG capsule     . mirtazapine (REMERON) 7.5 MG tablet     . naltrexone (DEPADE) 50 MG tablet Take 50 mg by mouth daily.    . naproxen (NAPROSYN) 250 MG tablet Take by mouth 2 (two) times daily with a meal.    .  ondansetron (ZOFRAN) 8 MG tablet Take 1 tablet (8 mg total) by mouth 2 (two) times daily as needed for nausea or vomiting. 60 tablet 1  . prochlorperazine (COMPAZINE) 10 MG tablet Take 1 tablet (10 mg total) by mouth every 6 (six) hours as needed for nausea or vomiting. 60 tablet 2  . ranitidine (ZANTAC) 150 MG capsule Take 150 mg by mouth 2 (two) times daily.    . tamsulosin (FLOMAX) 0.4 MG CAPS capsule     . acetaminophen (TYLENOL) 500 MG tablet Take 500 mg by mouth every 6 (six) hours as needed for mild pain.     [name] ondansetron (ZOFRAN) 8 MG tablet Take 1 tablet (8 mg total) by mouth 2 (two) times daily as needed. 60 tablet 1  . prochlorperazine (COMPAZINE) 10 MG tablet Take 1 tablet (10 mg total) by mouth every 6 (six) hours as needed (Nausea or vomiting). 60 tablet 2   No current facility-administered medications for this visit.    OBJECTIVE: Vitals:   11/20/19 0859  BP: 133/81  Pulse: 82  Resp: 18  Temp: (!) 96.1 F (35.6 C)  SpO2: 100%     Body mass index is 20.59 kg/m.    ECOG FS:0 - Asymptomatic  General: Well-developed, well-nourished, no acute distress. Eyes: Pink conjunctiva, anicteric sclera. HEENT: Normocephalic, moist mucous membranes.  Easily palpable right cervical lymphadenopathy. Lungs: No audible wheezing or coughing. Heart: Regular rate and rhythm. Abdomen: Soft, nontender, no obvious distention. Musculoskeletal: No edema, cyanosis, or clubbing. Neuro: Alert, answering all questions appropriately. Cranial nerves grossly intact. Skin: No rashes or petechiae noted. Psych: Normal affect.  LAB RESULTS:  Lab Results  Component Value Date   NA 139 11/20/2019   K 4.0 11/20/2019   CL 103 11/20/2019   CO2 27 11/20/2019   GLUCOSE 111 (H) 11/20/2019   BUN 10 11/20/2019   CREATININE 0.93 11/20/2019   CALCIUM 8.9 11/20/2019   PROT 6.7 11/20/2019   ALBUMIN 3.5 11/20/2019   AST 14 (L) 11/20/2019   ALT 8 11/20/2019   ALKPHOS 101 11/20/2019   BILITOT 0.5  11/20/2019   GFRNONAA >60 11/20/2019   GFRAA >60 11/20/2019    Lab Results  Component Value Date   WBC 8.5 11/20/2019   NEUTROABS 5.7 11/20/2019   HGB 14.5 11/20/2019   HCT 45.5 11/20/2019   MCV 89.7 11/20/2019   PLT 269 11/20/2019     STUDIES: No results found.  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  PLAN:    1.  Stage IVa squamous cell carcinoma of head and neck: Biopsy results from October 08, 2019 consistent with squamous cell carcinoma.  PET imaging from February 2021 reviewed independently with large lymph node conglomerate on right neck, but no  obvious source of primary.  Case was discussed with ENT and determined no further biopsies or surgeries are necessary as this would not alter patient's treatment moving forward.  Continue daily XRT.  Proceed with cycle 1 of weekly cisplatin today.  Return to clinic in 1 week for further evaluation and consideration of cycle 2.    I spent a total of 30 minutes reviewing chart data, face-to-face evaluation with the patient, counseling and coordination of care as detailed above.  Patient expressed understanding and was in agreement with this plan. He also understands that He can call clinic at any time with any questions, concerns, or complaints.   Cancer Staging Squamous cell carcinoma of head and neck (HCC) Staging form: Cervical Lymph Nodes and Unknown Primary Tumors of the Head and Neck, AJCC 8th Edition - Clinical stage from 11/07/2019: Stage IVA (cT0, cN2a, cM0) - Signed by Lloyd Huger, MD on 11/07/2019   Lloyd Huger, MD   11/20/2019 9:27 AM

## 2019-11-18 DIAGNOSIS — C4442 Squamous cell carcinoma of skin of scalp and neck: Secondary | ICD-10-CM | POA: Diagnosis not present

## 2019-11-18 DIAGNOSIS — M1990 Unspecified osteoarthritis, unspecified site: Secondary | ICD-10-CM | POA: Diagnosis not present

## 2019-11-18 DIAGNOSIS — Z79899 Other long term (current) drug therapy: Secondary | ICD-10-CM | POA: Diagnosis not present

## 2019-11-18 DIAGNOSIS — Z20828 Contact with and (suspected) exposure to other viral communicable diseases: Secondary | ICD-10-CM | POA: Diagnosis not present

## 2019-11-18 DIAGNOSIS — Z79818 Long term (current) use of other agents affecting estrogen receptors and estrogen levels: Secondary | ICD-10-CM | POA: Diagnosis not present

## 2019-11-18 DIAGNOSIS — Z599 Problem related to housing and economic circumstances, unspecified: Secondary | ICD-10-CM | POA: Diagnosis not present

## 2019-11-18 NOTE — Progress Notes (Signed)
PSN spoke with patient's caregiver this morning.  Patient used to reside at the caregiver's assisted living facility, but she closed it to retire.  She stated that no one in the patient's family would take him, after the closing, so she took him in her home.  Caregiver stated that they are caught up on the monthly expenses, but would call PSN if they needed help in the future.  PSN gave the caregiver his name and number.  PSN left caregiver a gas voucher since she is going to transport him for his appointments until Medicaid transportation can be arranged. No other needs identified.

## 2019-11-19 ENCOUNTER — Encounter: Payer: Self-pay | Admitting: Oncology

## 2019-11-19 ENCOUNTER — Other Ambulatory Visit: Payer: Self-pay

## 2019-11-19 ENCOUNTER — Ambulatory Visit: Admission: RE | Admit: 2019-11-19 | Payer: Medicare HMO | Source: Ambulatory Visit

## 2019-11-19 ENCOUNTER — Other Ambulatory Visit: Payer: Self-pay | Admitting: *Deleted

## 2019-11-19 DIAGNOSIS — C760 Malignant neoplasm of head, face and neck: Secondary | ICD-10-CM

## 2019-11-19 NOTE — Progress Notes (Signed)
Pre assessment call completed. Chart reviewed with patient and Marcelo Baldy, friend.

## 2019-11-20 ENCOUNTER — Ambulatory Visit
Admission: RE | Admit: 2019-11-20 | Discharge: 2019-11-20 | Disposition: A | Discharge status: Home / Self Care | Payer: Medicare HMO | Source: Ambulatory Visit | Attending: Radiation Oncology | Admitting: Radiation Oncology

## 2019-11-20 ENCOUNTER — Inpatient Hospital Stay (HOSPITAL_BASED_OUTPATIENT_CLINIC_OR_DEPARTMENT_OTHER): Payer: Medicare HMO | Admitting: Oncology

## 2019-11-20 ENCOUNTER — Inpatient Hospital Stay: Payer: Medicare HMO

## 2019-11-20 ENCOUNTER — Other Ambulatory Visit: Payer: Self-pay

## 2019-11-20 VITALS — BP 109/70 | HR 64 | Resp 16

## 2019-11-20 VITALS — BP 133/81 | HR 82 | Temp 96.1°F | Resp 18 | Wt 139.4 lb

## 2019-11-20 DIAGNOSIS — M1990 Unspecified osteoarthritis, unspecified site: Secondary | ICD-10-CM | POA: Diagnosis not present

## 2019-11-20 DIAGNOSIS — I2510 Atherosclerotic heart disease of native coronary artery without angina pectoris: Secondary | ICD-10-CM | POA: Diagnosis not present

## 2019-11-20 DIAGNOSIS — M2550 Pain in unspecified joint: Secondary | ICD-10-CM | POA: Diagnosis not present

## 2019-11-20 DIAGNOSIS — M25512 Pain in left shoulder: Secondary | ICD-10-CM | POA: Diagnosis not present

## 2019-11-20 DIAGNOSIS — R911 Solitary pulmonary nodule: Secondary | ICD-10-CM | POA: Diagnosis not present

## 2019-11-20 DIAGNOSIS — Z5111 Encounter for antineoplastic chemotherapy: Secondary | ICD-10-CM | POA: Diagnosis not present

## 2019-11-20 DIAGNOSIS — Z510 Encounter for antineoplastic radiation therapy: Secondary | ICD-10-CM | POA: Diagnosis not present

## 2019-11-20 DIAGNOSIS — C760 Malignant neoplasm of head, face and neck: Secondary | ICD-10-CM | POA: Diagnosis not present

## 2019-11-20 DIAGNOSIS — R55 Syncope and collapse: Secondary | ICD-10-CM | POA: Diagnosis not present

## 2019-11-20 DIAGNOSIS — J449 Chronic obstructive pulmonary disease, unspecified: Secondary | ICD-10-CM | POA: Diagnosis not present

## 2019-11-20 LAB — COMPREHENSIVE METABOLIC PANEL
ALT: 8 U/L (ref 0–44)
AST: 14 U/L — ABNORMAL LOW (ref 15–41)
Albumin: 3.5 g/dL (ref 3.5–5.0)
Alkaline Phosphatase: 101 U/L (ref 38–126)
Anion gap: 9 (ref 5–15)
BUN: 10 mg/dL (ref 8–23)
CO2: 27 mmol/L (ref 22–32)
Calcium: 8.9 mg/dL (ref 8.9–10.3)
Chloride: 103 mmol/L (ref 98–111)
Creatinine, Ser: 0.93 mg/dL (ref 0.61–1.24)
GFR calc Af Amer: >60 mL/min (ref >60)
GFR calc non Af Amer: >60 mL/min (ref >60)
Glucose, Bld: 111 mg/dL — ABNORMAL HIGH (ref 70–99)
Potassium: 4 mmol/L (ref 3.5–5.1)
Sodium: 139 mmol/L (ref 135–145)
Total Bilirubin: 0.5 mg/dL (ref 0.3–1.2)
Total Protein: 6.7 g/dL (ref 6.5–8.1)

## 2019-11-20 LAB — CBC WITH DIFFERENTIAL/PLATELET
Abs Immature Granulocytes: 0.04 10*3/uL (ref 0.00–0.07)
Basophils Absolute: 0.1 10*3/uL (ref 0.0–0.1)
Basophils Relative: 1 %
Eosinophils Absolute: 0.3 10*3/uL (ref 0.0–0.5)
Eosinophils Relative: 3 %
HCT: 45.5 % (ref 39.0–52.0)
Hemoglobin: 14.5 g/dL (ref 13.0–17.0)
Immature Granulocytes: 1 %
Lymphocytes Relative: 19 %
Lymphs Abs: 1.6 10*3/uL (ref 0.7–4.0)
MCH: 28.6 pg (ref 26.0–34.0)
MCHC: 31.9 g/dL (ref 30.0–36.0)
MCV: 89.7 fL (ref 80.0–100.0)
Monocytes Absolute: 0.8 10*3/uL (ref 0.1–1.0)
Monocytes Relative: 9 %
Neutro Abs: 5.7 10*3/uL (ref 1.7–7.7)
Neutrophils Relative %: 67 %
Platelets: 269 10*3/uL (ref 150–400)
RBC: 5.07 MIL/uL (ref 4.22–5.81)
RDW: 13.1 % (ref 11.5–15.5)
WBC: 8.5 10*3/uL (ref 4.0–10.5)
nRBC: 0 % (ref 0.0–0.2)

## 2019-11-20 MED ORDER — PROCHLORPERAZINE MALEATE 10 MG PO TABS: 10.0000 mg | ORAL_TABLET | Freq: Four times a day (QID) | ORAL | 2 refills | Status: DC | PRN

## 2019-11-20 MED ORDER — POTASSIUM CHLORIDE 2 MEQ/ML IV SOLN
Freq: Once | INTRAVENOUS | Status: AC
  Filled 2019-11-20: qty 1000

## 2019-11-20 MED ORDER — SODIUM CHLORIDE 0.9 % IV SOLN
40.0000 mg/m2 | Freq: Once | INTRAVENOUS | Status: AC
  Administered 2019-11-20: 70 mg via INTRAVENOUS
  Filled 2019-11-20: qty 70

## 2019-11-20 MED ORDER — SODIUM CHLORIDE 0.9 % IV SOLN
150.0000 mg | Freq: Once | INTRAVENOUS | Status: AC
  Administered 2019-11-20: 150 mg via INTRAVENOUS
  Filled 2019-11-20: qty 150

## 2019-11-20 MED ORDER — SODIUM CHLORIDE 0.9 % IV SOLN
Freq: Once | INTRAVENOUS | Status: AC
  Filled 2019-11-20: qty 250

## 2019-11-20 MED ORDER — SODIUM CHLORIDE 0.9 % IV SOLN
10.0000 mg | Freq: Once | INTRAVENOUS | Status: AC
  Administered 2019-11-20: 10 mg via INTRAVENOUS
  Filled 2019-11-20: qty 10

## 2019-11-20 MED ORDER — ONDANSETRON HCL 8 MG PO TABS: 8.0000 mg | ORAL_TABLET | Freq: Two times a day (BID) | ORAL | 1 refills | Status: DC | PRN

## 2019-11-20 MED ORDER — PALONOSETRON HCL INJECTION 0.25 MG/5ML
0.2500 mg | Freq: Once | INTRAVENOUS | Status: AC
  Administered 2019-11-20: 0.25 mg via INTRAVENOUS
  Filled 2019-11-20: qty 5

## 2019-11-21 ENCOUNTER — Telehealth: Payer: Self-pay

## 2019-11-21 ENCOUNTER — Ambulatory Visit
Admission: RE | Admit: 2019-11-21 | Discharge: 2019-11-21 | Disposition: A | Discharge status: Home / Self Care | Payer: Medicare HMO | Source: Ambulatory Visit | Attending: Radiation Oncology | Admitting: Radiation Oncology

## 2019-11-21 DIAGNOSIS — Z510 Encounter for antineoplastic radiation therapy: Secondary | ICD-10-CM | POA: Diagnosis not present

## 2019-11-21 DIAGNOSIS — F1010 Alcohol abuse, uncomplicated: Secondary | ICD-10-CM | POA: Diagnosis not present

## 2019-11-21 DIAGNOSIS — B182 Chronic viral hepatitis C: Secondary | ICD-10-CM | POA: Diagnosis not present

## 2019-11-21 DIAGNOSIS — C760 Malignant neoplasm of head, face and neck: Secondary | ICD-10-CM | POA: Diagnosis not present

## 2019-11-21 DIAGNOSIS — R972 Elevated prostate specific antigen [PSA]: Secondary | ICD-10-CM | POA: Diagnosis not present

## 2019-11-21 DIAGNOSIS — E559 Vitamin D deficiency, unspecified: Secondary | ICD-10-CM | POA: Diagnosis not present

## 2019-11-21 DIAGNOSIS — Z79899 Other long term (current) drug therapy: Secondary | ICD-10-CM | POA: Diagnosis not present

## 2019-11-21 DIAGNOSIS — K219 Gastro-esophageal reflux disease without esophagitis: Secondary | ICD-10-CM | POA: Diagnosis not present

## 2019-11-21 DIAGNOSIS — M1990 Unspecified osteoarthritis, unspecified site: Secondary | ICD-10-CM | POA: Diagnosis not present

## 2019-11-21 DIAGNOSIS — F1027 Alcohol dependence with alcohol-induced persisting dementia: Secondary | ICD-10-CM | POA: Diagnosis not present

## 2019-11-21 NOTE — Progress Notes (Signed)
[name]  Telephone:(336) [phone] Fax:(336) [phone]  ID: Zachary Gilmore OB: 06-21-1956  MR#: [id number]  PTW#:[id number]  Patient Care Team: Zachary American, FNP as PCP - General (Family Medicine) Lloyd Huger, MD as Consulting Physician (Hematology and Oncology)  CHIEF COMPLAINT: Stage IVa squamous cell carcinoma of head and neck  INTERVAL HISTORY: Patient returns to clinic today for further evaluation and consideration of cycle 2 of weekly cisplatin.  He tolerated his first infusion well without significant side effects.  He has mild left shoulder pain, but otherwise feels well. The mass on his neck is not painful and has decreased in size since initiating treatment.  He continues to deny any dysphagia. He has no neurologic complaints.  He denies any recent fevers or illnesses.  He has a good appetite and denies weight loss.  He has no chest pain, shortness of breath, cough, or hemoptysis.  He denies any nausea, vomiting, constipation, or diarrhea.  He has no urinary complaints.  Patient offers no further specific complaints today.  REVIEW OF SYSTEMS:   Review of Systems  Constitutional: Negative.  Negative for fever, malaise/fatigue and weight loss.  Respiratory: Negative.  Negative for cough and shortness of breath.   Cardiovascular: Negative.  Negative for chest pain and leg swelling.  Gastrointestinal: Negative.  Negative for abdominal pain, blood in stool and melena.  Genitourinary: Negative.  Negative for dysuria.  Musculoskeletal: Positive for joint pain. Negative for back pain and neck pain.  Skin: Negative.  Negative for rash.  Neurological: Negative for dizziness, focal weakness, weakness and headaches.  Psychiatric/Behavioral: Negative.  The patient is not nervous/anxious.     As per HPI. Otherwise, a complete review of systems is negative.  PAST MEDICAL HISTORY: Past Medical History:  Diagnosis Date  . Alcohol abuse   . Colorectal  cancer ([place])   . GERD (gastroesophageal reflux disease)   . Hepatitis   . History of substance abuse ([place])   . Neuropathy   . Osteoarthritis   . Prostate cancer ([place])   . Seizures ([place])    as a child  . Shoulder pain   . Tobacco use     PAST SURGICAL HISTORY: Past Surgical History:  Procedure Laterality Date  . CARDIAC CATHETERIZATION N/A 10/03/2016   Procedure: Left Heart Cath and Coronary Angiography;  Surgeon: Corey Skains, MD;  Location: [facility];  Service: Cardiovascular;  Laterality: N/A;  . COLON SURGERY    . colorectal cancer     "scraped off"   . PROSTATE BIOPSY    . SHOULDER ARTHROSCOPY WITH BICEPS TENDON REPAIR Right 10/03/2016   Procedure: SHOULDER ARTHROSCOPIC LIMITED DEBRIDEMENT  WITH BICEPS TENOLYSIS;  Surgeon: Corky Mull, MD;  Location: [facility];  Service: Orthopedics;  Laterality: Right;  . Stomach ulcers      FAMILY HISTORY: Family History  Problem Relation Age of Onset  . Heart attack Mother   . Colon cancer Father   . Cancer Father        prostate  . Cancer Brother        prostate    ADVANCED DIRECTIVES (Y/N):  N  HEALTH MAINTENANCE: Social History   Tobacco Use  . Smoking status: Current Every Day Smoker    Packs/day: 0.50    Years: 45.00    Pack years: 22.50    Types: Cigarettes  . Smokeless tobacco: Never Used  Substance Use Topics  . Alcohol use: Yes    Comment: occasional glass of wine  .  Drug use: Yes    Types: Marijuana     Colonoscopy:  PAP:  Bone density:  Lipid panel:  No Known Allergies  Current Outpatient Medications  Medication Sig Dispense Refill  . cholecalciferol (VITAMIN D3) 25 MCG (1000 UNIT) tablet Take 1,000 Units by mouth daily.    [name] docusate sodium (COLACE) 100 MG capsule Take 100 mg by mouth at bedtime.     . donepezil (ARICEPT) 10 MG tablet Take 10 mg by mouth at bedtime.     . gabapentin (NEURONTIN) 300 MG capsule Take 300 mg by mouth 3 (three) times daily.     . mirtazapine (REMERON)  7.5 MG tablet Take 7.5 mg by mouth at bedtime.     . naltrexone (DEPADE) 50 MG tablet Take 50 mg by mouth daily.    . naproxen (NAPROSYN) 250 MG tablet Take 500 mg by mouth 2 (two) times daily with a meal.     . ondansetron (ZOFRAN) 8 MG tablet Take 1 tablet (8 mg total) by mouth 2 (two) times daily as needed for nausea or vomiting. 60 tablet 1  . prochlorperazine (COMPAZINE) 10 MG tablet Take 1 tablet (10 mg total) by mouth every 6 (six) hours as needed for nausea or vomiting. 60 tablet 2  . tamsulosin (FLOMAX) 0.4 MG CAPS capsule Take 0.4 mg by mouth daily after supper.     [name] acetaminophen (TYLENOL) 500 MG tablet Take 500 mg by mouth every 6 (six) hours as needed for mild pain.      No current facility-administered medications for this visit.   Facility-Administered Medications Ordered in Other Visits  Medication Dose Route Frequency Provider Last Rate Last Admin  . CISplatin (PLATINOL) 70 mg in sodium chloride 0.9 % 250 mL chemo infusion  40 mg/m2 (Treatment Plan Recorded) Intravenous Once Lloyd Huger, MD      . dexamethasone (DECADRON) 10 mg in sodium chloride 0.9 % 50 mL IVPB  10 mg Intravenous Once Lloyd Huger, MD        OBJECTIVE: Vitals:   11/28/19 0858  BP: (!) 112/54  Pulse: 61  Temp: 98 F (36.7 C)     Body mass index is 20.59 kg/m.    ECOG FS:0 - Asymptomatic  General: Well-developed, well-nourished, no acute distress. Eyes: Pink conjunctiva, anicteric sclera. HEENT: Normocephalic, moist mucous membranes.  Right cervical lymph node decreased in size. Lungs: No audible wheezing or coughing. Heart: Regular rate and rhythm. Abdomen: Soft, nontender, no obvious distention. Musculoskeletal: No edema, cyanosis, or clubbing. Neuro: Alert, answering all questions appropriately. Cranial nerves grossly intact. Skin: No rashes or petechiae noted. Psych: Normal affect.  LAB RESULTS:  Lab Results  Component Value Date   NA 139 11/28/2019   K 4.0 11/28/2019    CL 102 11/28/2019   CO2 29 11/28/2019   GLUCOSE 59 (L) 11/28/2019   BUN 11 11/28/2019   CREATININE 1.06 11/28/2019   CALCIUM 8.9 11/28/2019   PROT 6.7 11/20/2019   ALBUMIN 3.5 11/20/2019   AST 14 (L) 11/20/2019   ALT 8 11/20/2019   ALKPHOS 101 11/20/2019   BILITOT 0.5 11/20/2019   GFRNONAA >60 11/28/2019   GFRAA >60 11/28/2019    Lab Results  Component Value Date   WBC 7.9 11/28/2019   NEUTROABS 5.7 11/28/2019   HGB 14.5 11/28/2019   HCT 42.8 11/28/2019   MCV 86.6 11/28/2019   PLT 244 11/28/2019     STUDIES: No results found.  ASSESSMENT: Stage IVa squamous cell carcinoma of head  and neck.  PLAN:    1.  Stage IVa squamous cell carcinoma of head and neck: Biopsy results from October 08, 2019 consistent with squamous cell carcinoma.  PET imaging from February 2021 reviewed independently with large lymph node conglomerate on right neck, but no obvious source of primary.  Case was discussed with ENT and determined no further biopsies or surgeries are necessary as this would not alter patient's treatment moving forward.  Continue daily XRT.  Proceed with cycle 2 of weekly cisplatin today.  Return to clinic in 1 week for further evaluation and consideration of cycle 3. 2.  Shoulder pain: Likely musculoskeletal in nature.  Recommended Tylenol as needed.  I spent a total of 30 minutes reviewing chart data, face-to-face evaluation with the patient, counseling and coordination of care as detailed above.   Patient expressed understanding and was in agreement with this plan. He also understands that He can call clinic at any time with any questions, concerns, or complaints.   Cancer Staging Squamous cell carcinoma of head and neck (HCC) Staging form: Cervical Lymph Nodes and Unknown Primary Tumors of the Head and Neck, AJCC 8th Edition - Clinical stage from 11/07/2019: Stage IVA (cT0, cN2a, cM0) - Signed by Lloyd Huger, MD on 11/07/2019   Lloyd Huger, MD   11/28/2019  12:48 PM

## 2019-11-21 NOTE — Telephone Encounter (Signed)
Telephone call to pt for follow up after receiving first chemo yesterday but no answer.  Phone rang and rang but no answer and did not go to voice mail.  Unable to reach pt.   Will try again later.

## 2019-11-24 ENCOUNTER — Ambulatory Visit
Admission: RE | Admit: 2019-11-24 | Discharge: 2019-11-24 | Disposition: A | Discharge status: Home / Self Care | Payer: Medicare HMO | Source: Ambulatory Visit | Attending: Radiation Oncology | Admitting: Radiation Oncology

## 2019-11-24 DIAGNOSIS — C760 Malignant neoplasm of head, face and neck: Secondary | ICD-10-CM | POA: Diagnosis not present

## 2019-11-24 DIAGNOSIS — Z510 Encounter for antineoplastic radiation therapy: Secondary | ICD-10-CM | POA: Diagnosis not present

## 2019-11-25 ENCOUNTER — Ambulatory Visit
Admission: RE | Admit: 2019-11-25 | Discharge: 2019-11-25 | Disposition: A | Discharge status: Home / Self Care | Payer: Medicare HMO | Source: Ambulatory Visit | Attending: Radiation Oncology | Admitting: Radiation Oncology

## 2019-11-25 DIAGNOSIS — C760 Malignant neoplasm of head, face and neck: Secondary | ICD-10-CM | POA: Diagnosis not present

## 2019-11-25 DIAGNOSIS — Z510 Encounter for antineoplastic radiation therapy: Secondary | ICD-10-CM | POA: Diagnosis not present

## 2019-11-26 ENCOUNTER — Ambulatory Visit
Admission: RE | Admit: 2019-11-26 | Discharge: 2019-11-26 | Disposition: A | Discharge status: Home / Self Care | Payer: Medicare HMO | Source: Ambulatory Visit | Attending: Radiation Oncology | Admitting: Radiation Oncology

## 2019-11-26 DIAGNOSIS — C760 Malignant neoplasm of head, face and neck: Secondary | ICD-10-CM | POA: Diagnosis not present

## 2019-11-26 DIAGNOSIS — Z510 Encounter for antineoplastic radiation therapy: Secondary | ICD-10-CM | POA: Diagnosis not present

## 2019-11-27 ENCOUNTER — Encounter: Payer: Self-pay | Admitting: Oncology

## 2019-11-27 ENCOUNTER — Ambulatory Visit
Admission: RE | Admit: 2019-11-27 | Discharge: 2019-11-27 | Disposition: A | Discharge status: Home / Self Care | Payer: Medicare HMO | Source: Ambulatory Visit | Attending: Radiation Oncology | Admitting: Radiation Oncology

## 2019-11-27 DIAGNOSIS — Z510 Encounter for antineoplastic radiation therapy: Secondary | ICD-10-CM | POA: Diagnosis not present

## 2019-11-27 DIAGNOSIS — C760 Malignant neoplasm of head, face and neck: Secondary | ICD-10-CM | POA: Diagnosis not present

## 2019-11-28 ENCOUNTER — Inpatient Hospital Stay: Payer: Medicare HMO

## 2019-11-28 ENCOUNTER — Ambulatory Visit
Admission: RE | Admit: 2019-11-28 | Discharge: 2019-11-28 | Disposition: A | Discharge status: Home / Self Care | Payer: Medicare HMO | Source: Ambulatory Visit | Attending: Radiation Oncology | Admitting: Radiation Oncology

## 2019-11-28 ENCOUNTER — Inpatient Hospital Stay (HOSPITAL_BASED_OUTPATIENT_CLINIC_OR_DEPARTMENT_OTHER): Payer: Medicare HMO | Admitting: Oncology

## 2019-11-28 ENCOUNTER — Encounter: Payer: Self-pay | Admitting: Oncology

## 2019-11-28 ENCOUNTER — Other Ambulatory Visit: Payer: Self-pay

## 2019-11-28 VITALS — BP 112/54 | HR 61 | Temp 98.0°F | Wt 139.4 lb

## 2019-11-28 DIAGNOSIS — C760 Malignant neoplasm of head, face and neck: Secondary | ICD-10-CM

## 2019-11-28 DIAGNOSIS — M2550 Pain in unspecified joint: Secondary | ICD-10-CM | POA: Diagnosis not present

## 2019-11-28 DIAGNOSIS — M1990 Unspecified osteoarthritis, unspecified site: Secondary | ICD-10-CM | POA: Diagnosis not present

## 2019-11-28 DIAGNOSIS — I2510 Atherosclerotic heart disease of native coronary artery without angina pectoris: Secondary | ICD-10-CM | POA: Diagnosis not present

## 2019-11-28 DIAGNOSIS — R911 Solitary pulmonary nodule: Secondary | ICD-10-CM | POA: Diagnosis not present

## 2019-11-28 DIAGNOSIS — J449 Chronic obstructive pulmonary disease, unspecified: Secondary | ICD-10-CM | POA: Diagnosis not present

## 2019-11-28 DIAGNOSIS — R55 Syncope and collapse: Secondary | ICD-10-CM | POA: Diagnosis not present

## 2019-11-28 DIAGNOSIS — M25512 Pain in left shoulder: Secondary | ICD-10-CM | POA: Diagnosis not present

## 2019-11-28 DIAGNOSIS — Z5111 Encounter for antineoplastic chemotherapy: Secondary | ICD-10-CM | POA: Diagnosis not present

## 2019-11-28 DIAGNOSIS — Z510 Encounter for antineoplastic radiation therapy: Secondary | ICD-10-CM | POA: Diagnosis not present

## 2019-11-28 LAB — CBC WITH DIFFERENTIAL/PLATELET
Abs Immature Granulocytes: 0.05 10*3/uL (ref 0.00–0.07)
Basophils Absolute: 0 10*3/uL (ref 0.0–0.1)
Basophils Relative: 1 %
Eosinophils Absolute: 0.2 10*3/uL (ref 0.0–0.5)
Eosinophils Relative: 3 %
HCT: 42.8 % (ref 39.0–52.0)
Hemoglobin: 14.5 g/dL (ref 13.0–17.0)
Immature Granulocytes: 1 %
Lymphocytes Relative: 13 %
Lymphs Abs: 1 10*3/uL (ref 0.7–4.0)
MCH: 29.4 pg (ref 26.0–34.0)
MCHC: 33.9 g/dL (ref 30.0–36.0)
MCV: 86.6 fL (ref 80.0–100.0)
Monocytes Absolute: 0.9 10*3/uL (ref 0.1–1.0)
Monocytes Relative: 12 %
Neutro Abs: 5.7 10*3/uL (ref 1.7–7.7)
Neutrophils Relative %: 70 %
Platelets: 244 10*3/uL (ref 150–400)
RBC: 4.94 MIL/uL (ref 4.22–5.81)
RDW: 12.8 % (ref 11.5–15.5)
WBC: 7.9 10*3/uL (ref 4.0–10.5)
nRBC: 0 % (ref 0.0–0.2)

## 2019-11-28 LAB — BASIC METABOLIC PANEL
Anion gap: 8 (ref 5–15)
BUN: 11 mg/dL (ref 8–23)
CO2: 29 mmol/L (ref 22–32)
Calcium: 8.9 mg/dL (ref 8.9–10.3)
Chloride: 102 mmol/L (ref 98–111)
Creatinine, Ser: 1.06 mg/dL (ref 0.61–1.24)
GFR calc Af Amer: >60 mL/min (ref >60)
GFR calc non Af Amer: >60 mL/min (ref >60)
Glucose, Bld: 59 mg/dL — ABNORMAL LOW (ref 70–99)
Potassium: 4 mmol/L (ref 3.5–5.1)
Sodium: 139 mmol/L (ref 135–145)

## 2019-11-28 MED ORDER — POTASSIUM CHLORIDE 2 MEQ/ML IV SOLN
Freq: Once | INTRAVENOUS | Status: AC
  Filled 2019-11-28: qty 1000

## 2019-11-28 MED ORDER — SODIUM CHLORIDE 0.9 % IV SOLN
10.0000 mg | Freq: Once | INTRAVENOUS | Status: AC
  Administered 2019-11-28: 10 mg via INTRAVENOUS
  Filled 2019-11-28: qty 10

## 2019-11-28 MED ORDER — PALONOSETRON HCL INJECTION 0.25 MG/5ML
0.2500 mg | Freq: Once | INTRAVENOUS | Status: AC
  Administered 2019-11-28: 0.25 mg via INTRAVENOUS
  Filled 2019-11-28: qty 5

## 2019-11-28 MED ORDER — SODIUM CHLORIDE 0.9 % IV SOLN
Freq: Once | INTRAVENOUS | Status: AC
  Filled 2019-11-28: qty 250

## 2019-11-28 MED ORDER — SODIUM CHLORIDE 0.9 % IV SOLN
40.0000 mg/m2 | Freq: Once | INTRAVENOUS | Status: AC
  Administered 2019-11-28: 70 mg via INTRAVENOUS
  Filled 2019-11-28: qty 70

## 2019-11-28 MED ORDER — SODIUM CHLORIDE 0.9 % IV SOLN
150.0000 mg | Freq: Once | INTRAVENOUS | Status: AC
  Administered 2019-11-28: 150 mg via INTRAVENOUS
  Filled 2019-11-28: qty 150

## 2019-11-28 NOTE — Progress Notes (Signed)
[name]  Telephone:(336) [phone] Fax:(336) [phone]  ID: Zachary Gilmore OB: 1956/08/19  MR#: [id number]  RCV#:[id number]  Patient Care Team: Carlos American, FNP as PCP - General (Family Medicine) Lloyd Huger, MD as Consulting Physician (Hematology and Oncology)  CHIEF COMPLAINT: Stage IVa squamous cell carcinoma of head and neck  INTERVAL HISTORY: Patient returns to clinic today for further evaluation and consideration of cycle 3 of weekly cisplatin.  He is tolerating his treatments well without significant side effects.  The mass on his neck is significantly reduced in size.  He does not complain of any pain today. He continues to deny any dysphagia. He has no neurologic complaints.  He denies any recent fevers or illnesses.  He has a good appetite and denies weight loss.  He has no chest pain, shortness of breath, cough, or hemoptysis.  He denies any nausea, vomiting, constipation, or diarrhea.  He has no urinary complaints.  Patient offers no specific complaints today.  REVIEW OF SYSTEMS:   Review of Systems  Constitutional: Negative.  Negative for fever, malaise/fatigue and weight loss.  Respiratory: Negative.  Negative for cough and shortness of breath.   Cardiovascular: Negative.  Negative for chest pain and leg swelling.  Gastrointestinal: Negative.  Negative for abdominal pain, blood in stool and melena.  Genitourinary: Negative.  Negative for dysuria.  Musculoskeletal: Negative.  Negative for back pain, joint pain and neck pain.  Skin: Negative.  Negative for rash.  Neurological: Negative for dizziness, focal weakness, weakness and headaches.  Psychiatric/Behavioral: Negative.  The patient is not nervous/anxious.     As per HPI. Otherwise, a complete review of systems is negative.  PAST MEDICAL HISTORY: Past Medical History:  Diagnosis Date  . Alcohol abuse   . Colorectal cancer ([place])   . GERD (gastroesophageal reflux disease)   .  Hepatitis   . History of substance abuse ([place])   . Neuropathy   . Osteoarthritis   . Prostate cancer ([place])   . Seizures ([place])    as a child  . Shoulder pain   . Tobacco use     PAST SURGICAL HISTORY: Past Surgical History:  Procedure Laterality Date  . CARDIAC CATHETERIZATION N/A 10/03/2016   Procedure: Left Heart Cath and Coronary Angiography;  Surgeon: Corey Skains, MD;  Location: [facility];  Service: Cardiovascular;  Laterality: N/A;  . COLON SURGERY    . colorectal cancer     "scraped off"   . PROSTATE BIOPSY    . SHOULDER ARTHROSCOPY WITH BICEPS TENDON REPAIR Right 10/03/2016   Procedure: SHOULDER ARTHROSCOPIC LIMITED DEBRIDEMENT  WITH BICEPS TENOLYSIS;  Surgeon: Corky Mull, MD;  Location: [facility];  Service: Orthopedics;  Laterality: Right;  . Stomach ulcers      FAMILY HISTORY: Family History  Problem Relation Age of Onset  . Heart attack Mother   . Colon cancer Father   . Cancer Father        prostate  . Cancer Brother        prostate    ADVANCED DIRECTIVES (Y/N):  N  HEALTH MAINTENANCE: Social History   Tobacco Use  . Smoking status: Current Every Day Smoker    Packs/day: 0.50    Years: 45.00    Pack years: 22.50    Types: Cigarettes  . Smokeless tobacco: Never Used  Substance Use Topics  . Alcohol use: Yes    Comment: occasional glass of wine  . Drug use: Yes    Types:  Marijuana     Colonoscopy:  PAP:  Bone density:  Lipid panel:  No Known Allergies  Current Outpatient Medications  Medication Sig Dispense Refill  . acetaminophen (TYLENOL) 500 MG tablet Take 500 mg by mouth every 6 (six) hours as needed for mild pain.     . cholecalciferol (VITAMIN D3) 25 MCG (1000 UNIT) tablet Take 1,000 Units by mouth daily.    [name] docusate sodium (COLACE) 100 MG capsule Take 100 mg by mouth at bedtime.     . donepezil (ARICEPT) 10 MG tablet Take 10 mg by mouth at bedtime.     . gabapentin (NEURONTIN) 300 MG capsule Take 300 mg by mouth 3  (three) times daily.     . mirtazapine (REMERON) 7.5 MG tablet Take 7.5 mg by mouth at bedtime.     . naltrexone (DEPADE) 50 MG tablet Take 50 mg by mouth daily.    . naproxen (NAPROSYN) 250 MG tablet Take 500 mg by mouth 2 (two) times daily with a meal.     . ondansetron (ZOFRAN) 8 MG tablet Take 1 tablet (8 mg total) by mouth 2 (two) times daily as needed for nausea or vomiting. 60 tablet 1  . prochlorperazine (COMPAZINE) 10 MG tablet Take 1 tablet (10 mg total) by mouth every 6 (six) hours as needed for nausea or vomiting. 60 tablet 2  . tamsulosin (FLOMAX) 0.4 MG CAPS capsule Take 0.4 mg by mouth daily after supper.      No current facility-administered medications for this visit.   Facility-Administered Medications Ordered in Other Visits  Medication Dose Route Frequency Provider Last Rate Last Admin  . CISplatin (PLATINOL) 70 mg in sodium chloride 0.9 % 250 mL chemo infusion  40 mg/m2 (Treatment Plan Recorded) Intravenous Once Lloyd Huger, MD      . dexamethasone (DECADRON) 10 mg in sodium chloride 0.9 % 50 mL IVPB  10 mg Intravenous Once Lloyd Huger, MD      . fosaprepitant (EMEND) 150 mg in sodium chloride 0.9 % 145 mL IVPB  150 mg Intravenous Once Lloyd Huger, MD      . palonosetron (ALOXI) injection 0.25 mg  0.25 mg Intravenous Once Lloyd Huger, MD        OBJECTIVE: Vitals:   12/04/19 1348 12/05/19 0922  BP: 111/65 111/65  Pulse: 65 65  Temp: (!) 95.9 F (35.5 C) (!) 95.9 F (35.5 C)     Body mass index is 20 kg/m.    ECOG FS:0 - Asymptomatic  General: Well-developed, well-nourished, no acute distress. Eyes: Pink conjunctiva, anicteric sclera. HEENT: Normocephalic, moist mucous membranes.  Right cervical lymph node significantly reduced in size. Lungs: No audible wheezing or coughing. Heart: Regular rate and rhythm. Abdomen: Soft, nontender, no obvious distention. Musculoskeletal: No edema, cyanosis, or clubbing. Neuro: Alert, answering  all questions appropriately. Cranial nerves grossly intact. Skin: No rashes or petechiae noted. Psych: Normal affect.  LAB RESULTS:  Lab Results  Component Value Date   NA 136 12/05/2019   K 4.2 12/05/2019   CL 101 12/05/2019   CO2 29 12/05/2019   GLUCOSE 97 12/05/2019   BUN 12 12/05/2019   CREATININE 1.07 12/05/2019   CALCIUM 8.9 12/05/2019   PROT 6.7 12/05/2019   ALBUMIN 3.4 (L) 12/05/2019   AST 16 12/05/2019   ALT 13 12/05/2019   ALKPHOS 98 12/05/2019   BILITOT 0.4 12/05/2019   GFRNONAA >60 12/05/2019   GFRAA >60 12/05/2019    Lab Results  Component  Value Date   WBC 6.9 12/05/2019   NEUTROABS 5.2 12/05/2019   HGB 14.2 12/05/2019   HCT 43.0 12/05/2019   MCV 87.8 12/05/2019   PLT 239 12/05/2019     STUDIES: No results found.  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  PLAN:    1.  Stage IVa squamous cell carcinoma of head and neck: Biopsy results from October 08, 2019 consistent with squamous cell carcinoma.  PET imaging from February 2021 reviewed independently with large lymph node conglomerate on right neck, but no obvious source of primary.  Case was discussed with ENT and determined no further biopsies or surgeries are necessary as this would not alter patient's treatment moving forward.  Continue with daily XRT.  Proceed with cycle 3 of weekly cisplatin today.  Return to clinic in 1 week for further evaluation and consideration of cycle 4.   2.  Shoulder pain: Patient does not complain of this today.  Continue Tylenol as needed.  I spent a total of 30 minutes reviewing chart data, face-to-face evaluation with the patient, counseling and coordination of care as detailed above.   Patient expressed understanding and was in agreement with this plan. He also understands that He can call clinic at any time with any questions, concerns, or complaints.   Cancer Staging Squamous cell carcinoma of head and neck (HCC) Staging form: Cervical  Lymph Nodes and Unknown Primary Tumors of the Head and Neck, AJCC 8th Edition - Clinical stage from 11/07/2019: Stage IVA (cT0, cN2a, cM0) - Signed by Lloyd Huger, MD on 11/07/2019   Lloyd Huger, MD   12/05/2019 10:22 AM

## 2019-11-28 NOTE — Progress Notes (Signed)
Patient states he is having some pain in his left shoulder that just started last night. He rates pain at 6.

## 2019-12-01 ENCOUNTER — Ambulatory Visit
Admission: RE | Admit: 2019-12-01 | Discharge: 2019-12-01 | Disposition: A | Discharge status: Home / Self Care | Payer: Medicare HMO | Source: Ambulatory Visit | Attending: Radiation Oncology | Admitting: Radiation Oncology

## 2019-12-01 DIAGNOSIS — C760 Malignant neoplasm of head, face and neck: Secondary | ICD-10-CM | POA: Diagnosis not present

## 2019-12-01 DIAGNOSIS — Z510 Encounter for antineoplastic radiation therapy: Secondary | ICD-10-CM | POA: Diagnosis not present

## 2019-12-02 ENCOUNTER — Ambulatory Visit
Admission: RE | Admit: 2019-12-02 | Discharge: 2019-12-02 | Disposition: A | Discharge status: Home / Self Care | Payer: Medicare HMO | Source: Ambulatory Visit | Attending: Radiation Oncology | Admitting: Radiation Oncology

## 2019-12-02 DIAGNOSIS — Z510 Encounter for antineoplastic radiation therapy: Secondary | ICD-10-CM | POA: Diagnosis not present

## 2019-12-02 DIAGNOSIS — C760 Malignant neoplasm of head, face and neck: Secondary | ICD-10-CM | POA: Diagnosis not present

## 2019-12-03 ENCOUNTER — Ambulatory Visit
Admission: RE | Admit: 2019-12-03 | Discharge: 2019-12-03 | Disposition: A | Discharge status: Home / Self Care | Payer: Medicare HMO | Source: Ambulatory Visit | Attending: Radiation Oncology | Admitting: Radiation Oncology

## 2019-12-03 DIAGNOSIS — Z510 Encounter for antineoplastic radiation therapy: Secondary | ICD-10-CM | POA: Diagnosis not present

## 2019-12-03 DIAGNOSIS — C760 Malignant neoplasm of head, face and neck: Secondary | ICD-10-CM | POA: Diagnosis not present

## 2019-12-04 ENCOUNTER — Ambulatory Visit
Admission: RE | Admit: 2019-12-04 | Discharge: 2019-12-04 | Disposition: A | Discharge status: Home / Self Care | Payer: Medicare HMO | Source: Ambulatory Visit | Attending: Radiation Oncology | Admitting: Radiation Oncology

## 2019-12-04 ENCOUNTER — Encounter: Payer: Self-pay | Admitting: Oncology

## 2019-12-04 DIAGNOSIS — C760 Malignant neoplasm of head, face and neck: Secondary | ICD-10-CM | POA: Diagnosis not present

## 2019-12-04 DIAGNOSIS — Z510 Encounter for antineoplastic radiation therapy: Secondary | ICD-10-CM | POA: Diagnosis not present

## 2019-12-05 ENCOUNTER — Encounter: Payer: Self-pay | Admitting: Oncology

## 2019-12-05 ENCOUNTER — Inpatient Hospital Stay: Payer: Medicare HMO

## 2019-12-05 ENCOUNTER — Ambulatory Visit
Admission: RE | Admit: 2019-12-05 | Discharge: 2019-12-05 | Disposition: A | Discharge status: Home / Self Care | Payer: Medicare HMO | Source: Ambulatory Visit | Attending: Radiation Oncology | Admitting: Radiation Oncology

## 2019-12-05 ENCOUNTER — Inpatient Hospital Stay (HOSPITAL_BASED_OUTPATIENT_CLINIC_OR_DEPARTMENT_OTHER): Payer: Medicare HMO | Admitting: Oncology

## 2019-12-05 VITALS — BP 111/65 | HR 65 | Temp 95.9°F | Wt 135.4 lb

## 2019-12-05 DIAGNOSIS — C760 Malignant neoplasm of head, face and neck: Secondary | ICD-10-CM | POA: Diagnosis not present

## 2019-12-05 DIAGNOSIS — M1990 Unspecified osteoarthritis, unspecified site: Secondary | ICD-10-CM | POA: Diagnosis not present

## 2019-12-05 DIAGNOSIS — I2510 Atherosclerotic heart disease of native coronary artery without angina pectoris: Secondary | ICD-10-CM | POA: Diagnosis not present

## 2019-12-05 DIAGNOSIS — M25512 Pain in left shoulder: Secondary | ICD-10-CM | POA: Diagnosis not present

## 2019-12-05 DIAGNOSIS — J449 Chronic obstructive pulmonary disease, unspecified: Secondary | ICD-10-CM | POA: Diagnosis not present

## 2019-12-05 DIAGNOSIS — R55 Syncope and collapse: Secondary | ICD-10-CM | POA: Diagnosis not present

## 2019-12-05 DIAGNOSIS — Z5111 Encounter for antineoplastic chemotherapy: Secondary | ICD-10-CM | POA: Diagnosis not present

## 2019-12-05 DIAGNOSIS — M2550 Pain in unspecified joint: Secondary | ICD-10-CM | POA: Diagnosis not present

## 2019-12-05 DIAGNOSIS — Z510 Encounter for antineoplastic radiation therapy: Secondary | ICD-10-CM | POA: Diagnosis not present

## 2019-12-05 DIAGNOSIS — R911 Solitary pulmonary nodule: Secondary | ICD-10-CM | POA: Diagnosis not present

## 2019-12-05 LAB — CBC WITH DIFFERENTIAL/PLATELET
Abs Immature Granulocytes: 0.03 10*3/uL (ref 0.00–0.07)
Basophils Absolute: 0 10*3/uL (ref 0.0–0.1)
Basophils Relative: 0 %
Eosinophils Absolute: 0.2 10*3/uL (ref 0.0–0.5)
Eosinophils Relative: 3 %
HCT: 43 % (ref 39.0–52.0)
Hemoglobin: 14.2 g/dL (ref 13.0–17.0)
Immature Granulocytes: 0 %
Lymphocytes Relative: 12 %
Lymphs Abs: 0.9 10*3/uL (ref 0.7–4.0)
MCH: 29 pg (ref 26.0–34.0)
MCHC: 33 g/dL (ref 30.0–36.0)
MCV: 87.8 fL (ref 80.0–100.0)
Monocytes Absolute: 0.6 10*3/uL (ref 0.1–1.0)
Monocytes Relative: 9 %
Neutro Abs: 5.2 10*3/uL (ref 1.7–7.7)
Neutrophils Relative %: 76 %
Platelets: 239 10*3/uL (ref 150–400)
RBC: 4.9 MIL/uL (ref 4.22–5.81)
RDW: 13.2 % (ref 11.5–15.5)
WBC: 6.9 10*3/uL (ref 4.0–10.5)
nRBC: 0 % (ref 0.0–0.2)

## 2019-12-05 LAB — COMPREHENSIVE METABOLIC PANEL
ALT: 13 U/L (ref 0–44)
AST: 16 U/L (ref 15–41)
Albumin: 3.4 g/dL — ABNORMAL LOW (ref 3.5–5.0)
Alkaline Phosphatase: 98 U/L (ref 38–126)
Anion gap: 6 (ref 5–15)
BUN: 12 mg/dL (ref 8–23)
CO2: 29 mmol/L (ref 22–32)
Calcium: 8.9 mg/dL (ref 8.9–10.3)
Chloride: 101 mmol/L (ref 98–111)
Creatinine, Ser: 1.07 mg/dL (ref 0.61–1.24)
GFR calc Af Amer: >60 mL/min (ref >60)
GFR calc non Af Amer: >60 mL/min (ref >60)
Glucose, Bld: 97 mg/dL (ref 70–99)
Potassium: 4.2 mmol/L (ref 3.5–5.1)
Sodium: 136 mmol/L (ref 135–145)
Total Bilirubin: 0.4 mg/dL (ref 0.3–1.2)
Total Protein: 6.7 g/dL (ref 6.5–8.1)

## 2019-12-05 MED ORDER — SODIUM CHLORIDE 0.9 % IV SOLN
10.0000 mg | Freq: Once | INTRAVENOUS | Status: AC
  Administered 2019-12-05: 10 mg via INTRAVENOUS
  Filled 2019-12-05: qty 10

## 2019-12-05 MED ORDER — SODIUM CHLORIDE 0.9 % IV SOLN
150.0000 mg | Freq: Once | INTRAVENOUS | Status: AC
  Administered 2019-12-05: 150 mg via INTRAVENOUS
  Filled 2019-12-05: qty 150

## 2019-12-05 MED ORDER — PALONOSETRON HCL INJECTION 0.25 MG/5ML
0.2500 mg | Freq: Once | INTRAVENOUS | Status: AC
  Administered 2019-12-05: 0.25 mg via INTRAVENOUS
  Filled 2019-12-05: qty 5

## 2019-12-05 MED ORDER — SODIUM CHLORIDE 0.9 % IV SOLN
40.0000 mg/m2 | Freq: Once | INTRAVENOUS | Status: AC
  Administered 2019-12-05: 70 mg via INTRAVENOUS
  Filled 2019-12-05: qty 70

## 2019-12-05 MED ORDER — SODIUM CHLORIDE 0.9 % IV SOLN
Freq: Once | INTRAVENOUS | Status: AC
  Filled 2019-12-05: qty 250

## 2019-12-05 MED ORDER — POTASSIUM CHLORIDE 2 MEQ/ML IV SOLN
Freq: Once | INTRAVENOUS | Status: AC
  Filled 2019-12-05: qty 1000

## 2019-12-06 NOTE — Progress Notes (Signed)
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  He continues to tolerate his treatments well without significant side effects.  He has left shoulder pain, but otherwise feels well. He continues to deny any dysphagia. He has no neurologic complaints.  He denies any recent fevers or illnesses.  He has a good appetite and denies weight loss.  He has no chest pain, shortness of breath, cough, or hemoptysis.  He denies any nausea, vomiting, constipation, or diarrhea.  He has no urinary complaints.  Patient offers no further specific complaints today.  REVIEW OF SYSTEMS:   Review of Systems  Constitutional: Negative.  Negative for fever, malaise/fatigue and weight loss.  Respiratory: Negative.  Negative for cough and shortness of breath.   Cardiovascular: Negative.  Negative for chest pain and leg swelling.  Gastrointestinal: Negative.  Negative for abdominal pain, blood in stool and melena.  Genitourinary: Negative.  Negative for dysuria.  Musculoskeletal: Positive for joint pain. Negative for back pain and neck pain.  Skin: Negative.  Negative for rash.  Neurological: Negative for dizziness, focal weakness, weakness and headaches.  Psychiatric/Behavioral: Negative.  The patient is not nervous/anxious.     As per HPI. Otherwise, a complete review of systems is negative.  PAST MEDICAL HISTORY: Past Medical History:  Diagnosis Date  . Alcohol abuse   . Colorectal cancer ([place])   . GERD (gastroesophageal reflux disease)   . Hepatitis   . History of  substance abuse ([place])   . Neuropathy   . Osteoarthritis   . Prostate cancer ([place])   . Seizures ([place])    as a child  . Shoulder pain   . Tobacco use     PAST SURGICAL HISTORY: Past Surgical History:  Procedure Laterality Date  . CARDIAC CATHETERIZATION N/A 10/03/2016   Procedure: Left Heart Cath and Coronary Angiography;  Surgeon: Corey Skains, MD;  Location: [facility];  Service: Cardiovascular;  Laterality: N/A;  . COLON SURGERY    . colorectal cancer     "scraped off"   . PROSTATE BIOPSY    . SHOULDER ARTHROSCOPY WITH BICEPS TENDON REPAIR Right 10/03/2016   Procedure: SHOULDER ARTHROSCOPIC LIMITED DEBRIDEMENT  WITH BICEPS TENOLYSIS;  Surgeon: Corky Mull, MD;  Location: [facility];  Service: Orthopedics;  Laterality: Right;  . Stomach ulcers      FAMILY HISTORY: Family History  Problem Relation Age of Onset  . Heart attack Mother   . Colon cancer Father   . Cancer Father        prostate  . Cancer Brother        prostate    ADVANCED DIRECTIVES (Y/N):  N  HEALTH MAINTENANCE: Social History   Tobacco Use  . Smoking status: Current Every Day Smoker    Packs/day: 0.50    Years: 45.00    Pack years: 22.50    Types: Cigarettes  . Smokeless tobacco: Never Used  Substance Use Topics  . Alcohol use: Yes    Comment: occasional glass of wine  . Drug use: Yes    Types: Marijuana     Colonoscopy:  PAP:  Bone density:  Lipid panel:  No Known Allergies  Current Outpatient Medications  Medication Sig Dispense Refill  . acetaminophen (TYLENOL) 500 MG tablet Take 500 mg by mouth every 6 (six) hours as needed for mild pain.     . cholecalciferol (VITAMIN D3) 25 MCG (1000 UNIT) tablet Take 1,000 Units by mouth daily.    [name] docusate sodium (COLACE) 100 MG capsule Take 100 mg by mouth at bedtime.     . donepezil (ARICEPT) 10 MG tablet Take 10 mg by mouth at bedtime.     . gabapentin (NEURONTIN) 300 MG capsule Take 300 mg by mouth 3 (three) times daily.     .  mirtazapine (REMERON) 7.5 MG tablet Take 7.5 mg by mouth at bedtime.     . naltrexone (DEPADE) 50 MG tablet Take 50 mg by mouth daily.    . naproxen (NAPROSYN) 250 MG tablet Take 500 mg by mouth 2 (two) times daily with a meal.     . ondansetron (ZOFRAN) 8 MG tablet Take 1 tablet (8 mg total) by mouth 2 (two) times daily as needed for nausea or vomiting. 60 tablet 1  . prochlorperazine (COMPAZINE) 10 MG tablet Take 1 tablet (10 mg total) by mouth every 6 (six) hours as needed for nausea or vomiting. 60 tablet 2  . sucralfate (CARAFATE) 1 g tablet Take 1 tablet (1 g total) by mouth 3 (three) times daily. Dissolve in 3-4 tbsp warm water, swish and swallow. 90 tablet 3  . tamsulosin (FLOMAX) 0.4 MG CAPS capsule Take 0.4 mg by mouth daily after supper.      No current facility-administered medications for this visit.    OBJECTIVE: Vitals:   12/12/19 0846  BP: 99/72  Pulse: 89  Resp: 18  Temp: (!) 96.4 F (35.8 C)  SpO2: 95%     Body mass index is 19.64 kg/m.    ECOG FS:0 - Asymptomatic  General: Well-developed, well-nourished, no acute distress. Eyes: Pink conjunctiva, anicteric sclera. HEENT: Normocephalic, moist mucous membranes.  No palpable lymphadenopathy. Lungs: No audible wheezing or coughing. Heart: Regular rate and rhythm. Abdomen: Soft, nontender, no obvious distention. Musculoskeletal: No edema, cyanosis, or clubbing.  Full range of motion and 5 out of 5 strength of bilateral upper extremity. Neuro: Alert, answering all questions appropriately. Cranial nerves grossly intact. Skin: No rashes or petechiae noted. Psych: Normal affect.  LAB RESULTS:  Lab Results  Component Value Date   NA 137 12/12/2019   K 4.4 12/12/2019   CL 101 12/12/2019   CO2 29 12/12/2019   GLUCOSE 148 (H) 12/12/2019   BUN 12 12/12/2019   CREATININE 1.21 12/12/2019   CALCIUM 8.9 12/12/2019   PROT 6.8 12/12/2019   ALBUMIN 3.4 (L) 12/12/2019   AST 16 12/12/2019   ALT 14 12/12/2019   ALKPHOS  104 12/12/2019   BILITOT 0.5 12/12/2019   GFRNONAA >60 12/12/2019   GFRAA >60 12/12/2019    Lab Results  Component Value Date   WBC 6.5 12/12/2019   NEUTROABS 5.1 12/12/2019   HGB 13.8 12/12/2019   HCT 41.0 12/12/2019   MCV 86.7 12/12/2019   PLT 243 12/12/2019     STUDIES: No results found.  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  PLAN:    1.  Stage IVa squamous cell carcinoma of head and neck: Biopsy results from October 08, 2019 consistent with squamous cell carcinoma.  PET imaging from February 2021 reviewed independently with large lymph node conglomerate on right  neck, but no obvious source of primary.  Case was discussed with ENT and determined no further biopsies or surgeries are necessary as this would not alter patient's treatment moving forward.  Continue with daily XRT completing treatment on January 07, 2020.  Proceed with cycle 4 of weekly cisplatin today.  Return to clinic in 1 week for further evaluation and consideration of cycle 5.   2.  Shoulder pain: Previously, patient complained of right shoulder pain.  Today his left shoulder hurts.  Continue Tylenol as needed.  I spent a total of 30 minutes reviewing chart data, face-to-face evaluation with the patient, counseling and coordination of care as detailed above.   Patient expressed understanding and was in agreement with this plan. He also understands that He can call clinic at any time with any questions, concerns, or complaints.   Cancer Staging Squamous cell carcinoma of head and neck (HCC) Staging form: Cervical Lymph Nodes and Unknown Primary Tumors of the Head and Neck, AJCC 8th Edition - Clinical stage from 11/07/2019: Stage IVA (cT0, cN2a, cM0) - Signed by Zachary Huger, MD on 11/07/2019   Zachary Huger, MD   12/12/2019 9:08 AM

## 2019-12-08 ENCOUNTER — Ambulatory Visit
Admission: RE | Admit: 2019-12-08 | Discharge: 2019-12-08 | Disposition: A | Discharge status: Home / Self Care | Payer: Medicare HMO | Source: Ambulatory Visit | Attending: Radiation Oncology | Admitting: Radiation Oncology

## 2019-12-08 DIAGNOSIS — C760 Malignant neoplasm of head, face and neck: Secondary | ICD-10-CM | POA: Diagnosis not present

## 2019-12-08 DIAGNOSIS — Z510 Encounter for antineoplastic radiation therapy: Secondary | ICD-10-CM | POA: Diagnosis not present

## 2019-12-09 ENCOUNTER — Ambulatory Visit
Admission: RE | Admit: 2019-12-09 | Discharge: 2019-12-09 | Disposition: A | Discharge status: Home / Self Care | Payer: Medicare HMO | Source: Ambulatory Visit | Attending: Radiation Oncology | Admitting: Radiation Oncology

## 2019-12-09 ENCOUNTER — Other Ambulatory Visit: Payer: Self-pay | Admitting: *Deleted

## 2019-12-09 DIAGNOSIS — Z510 Encounter for antineoplastic radiation therapy: Secondary | ICD-10-CM | POA: Diagnosis not present

## 2019-12-09 DIAGNOSIS — C760 Malignant neoplasm of head, face and neck: Secondary | ICD-10-CM | POA: Diagnosis not present

## 2019-12-09 MED ORDER — SUCRALFATE 1 G PO TABS: 1.0000 g | ORAL_TABLET | Freq: Three times a day (TID) | ORAL | 3 refills | Status: DC

## 2019-12-10 ENCOUNTER — Ambulatory Visit
Admission: RE | Admit: 2019-12-10 | Discharge: 2019-12-10 | Disposition: A | Discharge status: Home / Self Care | Payer: Medicare HMO | Source: Ambulatory Visit | Attending: Radiation Oncology | Admitting: Radiation Oncology

## 2019-12-10 DIAGNOSIS — Z510 Encounter for antineoplastic radiation therapy: Secondary | ICD-10-CM | POA: Diagnosis not present

## 2019-12-10 DIAGNOSIS — C760 Malignant neoplasm of head, face and neck: Secondary | ICD-10-CM | POA: Diagnosis not present

## 2019-12-11 ENCOUNTER — Other Ambulatory Visit: Payer: Self-pay

## 2019-12-11 ENCOUNTER — Ambulatory Visit
Admission: RE | Admit: 2019-12-11 | Discharge: 2019-12-11 | Disposition: A | Discharge status: Home / Self Care | Payer: Medicare HMO | Source: Ambulatory Visit | Attending: Radiation Oncology | Admitting: Radiation Oncology

## 2019-12-11 ENCOUNTER — Encounter: Payer: Self-pay | Admitting: Oncology

## 2019-12-11 DIAGNOSIS — C760 Malignant neoplasm of head, face and neck: Secondary | ICD-10-CM | POA: Diagnosis not present

## 2019-12-11 DIAGNOSIS — Z510 Encounter for antineoplastic radiation therapy: Secondary | ICD-10-CM | POA: Diagnosis not present

## 2019-12-11 NOTE — Progress Notes (Signed)
Patient complains of pain in both arms. Rates pain at 6. Also states food has lost it's taste and has been skipping meals.

## 2019-12-12 ENCOUNTER — Encounter: Payer: Self-pay | Admitting: Oncology

## 2019-12-12 ENCOUNTER — Inpatient Hospital Stay: Payer: Medicare HMO | Attending: Oncology

## 2019-12-12 ENCOUNTER — Inpatient Hospital Stay: Payer: Medicare HMO

## 2019-12-12 ENCOUNTER — Inpatient Hospital Stay (HOSPITAL_BASED_OUTPATIENT_CLINIC_OR_DEPARTMENT_OTHER): Payer: Medicare HMO | Admitting: Oncology

## 2019-12-12 ENCOUNTER — Ambulatory Visit
Admission: RE | Admit: 2019-12-12 | Discharge: 2019-12-12 | Disposition: A | Discharge status: Home / Self Care | Payer: Medicare HMO | Source: Ambulatory Visit | Attending: Radiation Oncology | Admitting: Radiation Oncology

## 2019-12-12 VITALS — BP 99/72 | HR 89 | Temp 96.4°F | Resp 18 | Wt 133.0 lb

## 2019-12-12 DIAGNOSIS — R5383 Other fatigue: Secondary | ICD-10-CM | POA: Insufficient documentation

## 2019-12-12 DIAGNOSIS — Z7952 Long term (current) use of systemic steroids: Secondary | ICD-10-CM | POA: Diagnosis not present

## 2019-12-12 DIAGNOSIS — F1290 Cannabis use, unspecified, uncomplicated: Secondary | ICD-10-CM | POA: Insufficient documentation

## 2019-12-12 DIAGNOSIS — Z800 Family history of malignant neoplasm of digestive organs: Secondary | ICD-10-CM | POA: Insufficient documentation

## 2019-12-12 DIAGNOSIS — Z5111 Encounter for antineoplastic chemotherapy: Secondary | ICD-10-CM | POA: Diagnosis not present

## 2019-12-12 DIAGNOSIS — D696 Thrombocytopenia, unspecified: Secondary | ICD-10-CM | POA: Diagnosis not present

## 2019-12-12 DIAGNOSIS — R42 Dizziness and giddiness: Secondary | ICD-10-CM | POA: Insufficient documentation

## 2019-12-12 DIAGNOSIS — C760 Malignant neoplasm of head, face and neck: Secondary | ICD-10-CM | POA: Insufficient documentation

## 2019-12-12 DIAGNOSIS — Z510 Encounter for antineoplastic radiation therapy: Secondary | ICD-10-CM | POA: Diagnosis not present

## 2019-12-12 DIAGNOSIS — M25512 Pain in left shoulder: Secondary | ICD-10-CM | POA: Insufficient documentation

## 2019-12-12 DIAGNOSIS — Z79899 Other long term (current) drug therapy: Secondary | ICD-10-CM | POA: Diagnosis not present

## 2019-12-12 DIAGNOSIS — M25511 Pain in right shoulder: Secondary | ICD-10-CM | POA: Diagnosis not present

## 2019-12-12 DIAGNOSIS — R1310 Dysphagia, unspecified: Secondary | ICD-10-CM | POA: Diagnosis not present

## 2019-12-12 DIAGNOSIS — R531 Weakness: Secondary | ICD-10-CM | POA: Insufficient documentation

## 2019-12-12 DIAGNOSIS — J029 Acute pharyngitis, unspecified: Secondary | ICD-10-CM | POA: Insufficient documentation

## 2019-12-12 DIAGNOSIS — Z8546 Personal history of malignant neoplasm of prostate: Secondary | ICD-10-CM | POA: Diagnosis not present

## 2019-12-12 DIAGNOSIS — M1990 Unspecified osteoarthritis, unspecified site: Secondary | ICD-10-CM | POA: Diagnosis not present

## 2019-12-12 DIAGNOSIS — F17210 Nicotine dependence, cigarettes, uncomplicated: Secondary | ICD-10-CM | POA: Diagnosis not present

## 2019-12-12 DIAGNOSIS — Z8042 Family history of malignant neoplasm of prostate: Secondary | ICD-10-CM | POA: Diagnosis not present

## 2019-12-12 DIAGNOSIS — Z8249 Family history of ischemic heart disease and other diseases of the circulatory system: Secondary | ICD-10-CM | POA: Insufficient documentation

## 2019-12-12 LAB — COMPREHENSIVE METABOLIC PANEL
ALT: 14 U/L (ref 0–44)
AST: 16 U/L (ref 15–41)
Albumin: 3.4 g/dL — ABNORMAL LOW (ref 3.5–5.0)
Alkaline Phosphatase: 104 U/L (ref 38–126)
Anion gap: 7 (ref 5–15)
BUN: 12 mg/dL (ref 8–23)
CO2: 29 mmol/L (ref 22–32)
Calcium: 8.9 mg/dL (ref 8.9–10.3)
Chloride: 101 mmol/L (ref 98–111)
Creatinine, Ser: 1.21 mg/dL (ref 0.61–1.24)
GFR calc Af Amer: >60 mL/min (ref >60)
GFR calc non Af Amer: >60 mL/min (ref >60)
Glucose, Bld: 148 mg/dL — ABNORMAL HIGH (ref 70–99)
Potassium: 4.4 mmol/L (ref 3.5–5.1)
Sodium: 137 mmol/L (ref 135–145)
Total Bilirubin: 0.5 mg/dL (ref 0.3–1.2)
Total Protein: 6.8 g/dL (ref 6.5–8.1)

## 2019-12-12 LAB — CBC WITH DIFFERENTIAL/PLATELET
Abs Immature Granulocytes: 0.03 10*3/uL (ref 0.00–0.07)
Basophils Absolute: 0 10*3/uL (ref 0.0–0.1)
Basophils Relative: 1 %
Eosinophils Absolute: 0.1 10*3/uL (ref 0.0–0.5)
Eosinophils Relative: 2 %
HCT: 41 % (ref 39.0–52.0)
Hemoglobin: 13.8 g/dL (ref 13.0–17.0)
Immature Granulocytes: 1 %
Lymphocytes Relative: 11 %
Lymphs Abs: 0.7 10*3/uL (ref 0.7–4.0)
MCH: 29.2 pg (ref 26.0–34.0)
MCHC: 33.7 g/dL (ref 30.0–36.0)
MCV: 86.7 fL (ref 80.0–100.0)
Monocytes Absolute: 0.6 10*3/uL (ref 0.1–1.0)
Monocytes Relative: 9 %
Neutro Abs: 5.1 10*3/uL (ref 1.7–7.7)
Neutrophils Relative %: 76 %
Platelets: 243 10*3/uL (ref 150–400)
RBC: 4.73 MIL/uL (ref 4.22–5.81)
RDW: 13.2 % (ref 11.5–15.5)
WBC: 6.5 10*3/uL (ref 4.0–10.5)
nRBC: 0 % (ref 0.0–0.2)

## 2019-12-12 MED ORDER — SODIUM CHLORIDE 0.9 % IV SOLN
10.0000 mg | Freq: Once | INTRAVENOUS | Status: AC
  Administered 2019-12-12: 10 mg via INTRAVENOUS
  Filled 2019-12-12: qty 10

## 2019-12-12 MED ORDER — PALONOSETRON HCL INJECTION 0.25 MG/5ML
0.2500 mg | Freq: Once | INTRAVENOUS | Status: AC
  Administered 2019-12-12: 12:00:00 0.25 mg via INTRAVENOUS
  Filled 2019-12-12: qty 5

## 2019-12-12 MED ORDER — SODIUM CHLORIDE 0.9 % IV SOLN
Freq: Once | INTRAVENOUS | Status: AC
  Filled 2019-12-12: qty 250

## 2019-12-12 MED ORDER — SODIUM CHLORIDE 0.9 % IV SOLN
40.0000 mg/m2 | Freq: Once | INTRAVENOUS | Status: AC
  Administered 2019-12-12: 12:00:00 70 mg via INTRAVENOUS
  Filled 2019-12-12: qty 70

## 2019-12-12 MED ORDER — SODIUM CHLORIDE 0.9 % IV SOLN
150.0000 mg | Freq: Once | INTRAVENOUS | Status: AC
  Administered 2019-12-12: 12:00:00 150 mg via INTRAVENOUS
  Filled 2019-12-12: qty 150

## 2019-12-12 MED ORDER — POTASSIUM CHLORIDE 2 MEQ/ML IV SOLN
Freq: Once | INTRAVENOUS | Status: AC
  Filled 2019-12-12: qty 1000

## 2019-12-12 NOTE — Progress Notes (Signed)
[place]  Telephone:(336) [phone] Fax:(336) [phone]  ID: Zachary Gilmore OB: 1956-09-01  MR#: [id number]  AOZ#:[id number]  Patient Care Team: Carlos American, FNP as PCP - General (Family Medicine) Lloyd Huger, MD as Consulting Physician (Hematology and Oncology)  CHIEF COMPLAINT: Stage IVa squamous cell carcinoma of head and neck  INTERVAL HISTORY: Patient returns to clinic today for further evaluation and consideration of cycle 5 of weekly cisplatin.  He is having increased dysphagia, but has declined use of Carafate that was given to him.  He otherwise feels well.  He has no neurologic complaints.  He denies any recent fevers or illnesses.  He has a good appetite and denies weight loss.  He has no chest pain, shortness of breath, cough, or hemoptysis.  He denies any nausea, vomiting, constipation, or diarrhea.  He has no urinary complaints.  Patient offers no further specific complaints today.  REVIEW OF SYSTEMS:   Review of Systems  Constitutional: Negative.  Negative for fever, malaise/fatigue and weight loss.  HENT: Positive for sore throat.   Respiratory: Negative.  Negative for cough and shortness of breath.   Cardiovascular: Negative.  Negative for chest pain and leg swelling.  Gastrointestinal: Negative.  Negative for abdominal pain, blood in stool and melena.  Genitourinary: Negative.  Negative for dysuria.  Musculoskeletal: Negative.  Negative for back pain, joint pain and neck pain.  Skin: Negative.  Negative for rash.  Neurological: Negative for dizziness, focal weakness, weakness and headaches.  Psychiatric/Behavioral: Negative.  The patient is not nervous/anxious.     As per HPI. Otherwise, a complete review of systems is negative.  PAST MEDICAL HISTORY: Past Medical History:  Diagnosis Date  . Alcohol abuse   . Colorectal cancer ([place])   . GERD (gastroesophageal reflux disease)   . Hepatitis   . History of substance abuse ([place])    . Neuropathy   . Osteoarthritis   . Prostate cancer ([place])   . Seizures ([place])    as a child  . Shoulder pain   . Tobacco use     PAST SURGICAL HISTORY: Past Surgical History:  Procedure Laterality Date  . CARDIAC CATHETERIZATION N/A 10/03/2016   Procedure: Left Heart Cath and Coronary Angiography;  Surgeon: Corey Skains, MD;  Location: [facility];  Service: Cardiovascular;  Laterality: N/A;  . COLON SURGERY    . colorectal cancer     "scraped off"   . PROSTATE BIOPSY    . SHOULDER ARTHROSCOPY WITH BICEPS TENDON REPAIR Right 10/03/2016   Procedure: SHOULDER ARTHROSCOPIC LIMITED DEBRIDEMENT  WITH BICEPS TENOLYSIS;  Surgeon: Corky Mull, MD;  Location: [facility];  Service: Orthopedics;  Laterality: Right;  . Stomach ulcers      FAMILY HISTORY: Family History  Problem Relation Age of Onset  . Heart attack Mother   . Colon cancer Father   . Cancer Father        prostate  . Cancer Brother        prostate    ADVANCED DIRECTIVES (Y/N):  N  HEALTH MAINTENANCE: Social History   Tobacco Use  . Smoking status: Current Every Day Smoker    Packs/day: 0.50    Years: 45.00    Pack years: 22.50    Types: Cigarettes  . Smokeless tobacco: Never Used  Substance Use Topics  . Alcohol use: Yes    Comment: occasional glass of wine  . Drug use: Yes    Types: Marijuana     Colonoscopy:  PAP:  Bone density:  Lipid panel:  No Known Allergies  Current Outpatient Medications  Medication Sig Dispense Refill  . acetaminophen (TYLENOL) 500 MG tablet Take 500 mg by mouth every 6 (six) hours as needed for mild pain.     . cholecalciferol (VITAMIN D3) 25 MCG (1000 UNIT) tablet Take 1,000 Units by mouth daily.    [name] docusate sodium (COLACE) 100 MG capsule Take 100 mg by mouth at bedtime.     . donepezil (ARICEPT) 10 MG tablet Take 10 mg by mouth at bedtime.     . gabapentin (NEURONTIN) 300 MG capsule Take 300 mg by mouth 3 (three) times daily.     . mirtazapine (REMERON)  7.5 MG tablet Take 7.5 mg by mouth at bedtime.     . naltrexone (DEPADE) 50 MG tablet Take 50 mg by mouth daily.    . naproxen (NAPROSYN) 250 MG tablet Take 500 mg by mouth 2 (two) times daily with a meal.     . ondansetron (ZOFRAN) 8 MG tablet Take 1 tablet (8 mg total) by mouth 2 (two) times daily as needed for nausea or vomiting. 60 tablet 1  . prochlorperazine (COMPAZINE) 10 MG tablet Take 1 tablet (10 mg total) by mouth every 6 (six) hours as needed for nausea or vomiting. 60 tablet 2  . sucralfate (CARAFATE) 1 g tablet Take 1 tablet (1 g total) by mouth 3 (three) times daily. Dissolve in 3-4 tbsp warm water, swish and swallow. 90 tablet 3  . tamsulosin (FLOMAX) 0.4 MG CAPS capsule Take 0.4 mg by mouth daily after supper.      No current facility-administered medications for this visit.    OBJECTIVE: Vitals:   12/18/19 1126  BP: 108/72  Pulse: 77  Resp: 18  Temp: 97.8 F (36.6 C)  SpO2: 97%     Body mass index is 19.06 kg/m.    ECOG FS:0 - Asymptomatic  General: Well-developed, well-nourished, no acute distress. Eyes: Pink conjunctiva, anicteric sclera. HEENT: Normocephalic, moist mucous membranes.  No palpable lymphadenopathy. Lungs: No audible wheezing or coughing. Heart: Regular rate and rhythm. Abdomen: Soft, nontender, no obvious distention. Musculoskeletal: No edema, cyanosis, or clubbing. Neuro: Alert, answering all questions appropriately. Cranial nerves grossly intact. Skin: No rashes or petechiae noted. Psych: Normal affect.  LAB RESULTS:  Lab Results  Component Value Date   NA 137 12/19/2019   K 3.6 12/19/2019   CL 99 12/19/2019   CO2 29 12/19/2019   GLUCOSE 102 (H) 12/19/2019   BUN 12 12/19/2019   CREATININE 1.21 12/19/2019   CALCIUM 8.9 12/19/2019   PROT 6.7 12/19/2019   ALBUMIN 3.3 (L) 12/19/2019   AST 15 12/19/2019   ALT 11 12/19/2019   ALKPHOS 96 12/19/2019   BILITOT 0.5 12/19/2019   GFRNONAA >60 12/19/2019   GFRAA >60 12/19/2019    Lab  Results  Component Value Date   WBC 5.1 12/19/2019   NEUTROABS 3.9 12/19/2019   HGB 12.5 (L) 12/19/2019   HCT 37.0 (L) 12/19/2019   MCV 87.1 12/19/2019   PLT 159 12/19/2019     STUDIES: No results found.  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  PLAN:    1.  Stage IVa squamous cell carcinoma of head and neck: Biopsy results from October 08, 2019 consistent with squamous cell carcinoma.  PET imaging from February 2021 reviewed independently with large lymph node conglomerate on right neck, but no obvious source of primary.  Case was discussed with  ENT and determined no further biopsies or surgeries are necessary as this would not alter patient's treatment moving forward.  Continue with daily XRT completing treatment on January 07, 2020.  Proceed with cycle 5 of weekly cisplatin today.  Return to clinic in 1 week for further evaluation and consideration of cycle 6. 2.  Shoulder pain: Patient does not complain of this today.  Monitor.  Continue Tylenol as needed. 3.  Dysphagia: Patient was given a prescription for Carafate which she refuses to use.  Continue to monitor.  I spent a total of 30 minutes reviewing chart data, face-to-face evaluation with the patient, counseling and coordination of care as detailed above.  Patient expressed understanding and was in agreement with this plan. He also understands that He can call clinic at any time with any questions, concerns, or complaints.   Cancer Staging Squamous cell carcinoma of head and neck (HCC) Staging form: Cervical Lymph Nodes and Unknown Primary Tumors of the Head and Neck, AJCC 8th Edition - Clinical stage from 11/07/2019: Stage IVA (cT0, cN2a, cM0) - Signed by Lloyd Huger, MD on 11/07/2019   Lloyd Huger, MD   12/19/2019 9:31 AM

## 2019-12-15 ENCOUNTER — Ambulatory Visit
Admission: RE | Admit: 2019-12-15 | Discharge: 2019-12-15 | Disposition: A | Discharge status: Home / Self Care | Payer: Medicare HMO | Source: Ambulatory Visit | Attending: Radiation Oncology | Admitting: Radiation Oncology

## 2019-12-15 DIAGNOSIS — Z510 Encounter for antineoplastic radiation therapy: Secondary | ICD-10-CM | POA: Diagnosis not present

## 2019-12-15 DIAGNOSIS — C760 Malignant neoplasm of head, face and neck: Secondary | ICD-10-CM | POA: Diagnosis not present

## 2019-12-16 ENCOUNTER — Ambulatory Visit
Admission: RE | Admit: 2019-12-16 | Discharge: 2019-12-16 | Disposition: A | Discharge status: Home / Self Care | Payer: Medicare HMO | Source: Ambulatory Visit | Attending: Radiation Oncology | Admitting: Radiation Oncology

## 2019-12-16 DIAGNOSIS — Z510 Encounter for antineoplastic radiation therapy: Secondary | ICD-10-CM | POA: Diagnosis not present

## 2019-12-16 DIAGNOSIS — C760 Malignant neoplasm of head, face and neck: Secondary | ICD-10-CM | POA: Diagnosis not present

## 2019-12-17 ENCOUNTER — Ambulatory Visit
Admission: RE | Admit: 2019-12-17 | Discharge: 2019-12-17 | Disposition: A | Discharge status: Home / Self Care | Payer: Medicare HMO | Source: Ambulatory Visit | Attending: Radiation Oncology | Admitting: Radiation Oncology

## 2019-12-17 DIAGNOSIS — Z510 Encounter for antineoplastic radiation therapy: Secondary | ICD-10-CM | POA: Diagnosis not present

## 2019-12-17 DIAGNOSIS — C760 Malignant neoplasm of head, face and neck: Secondary | ICD-10-CM | POA: Diagnosis not present

## 2019-12-18 ENCOUNTER — Ambulatory Visit
Admission: RE | Admit: 2019-12-18 | Discharge: 2019-12-18 | Disposition: A | Discharge status: Home / Self Care | Payer: Medicare HMO | Source: Ambulatory Visit | Attending: Radiation Oncology | Admitting: Radiation Oncology

## 2019-12-18 ENCOUNTER — Encounter: Payer: Self-pay | Admitting: Oncology

## 2019-12-18 ENCOUNTER — Other Ambulatory Visit: Payer: Self-pay

## 2019-12-18 DIAGNOSIS — R42 Dizziness and giddiness: Secondary | ICD-10-CM | POA: Diagnosis not present

## 2019-12-18 DIAGNOSIS — R1310 Dysphagia, unspecified: Secondary | ICD-10-CM | POA: Diagnosis not present

## 2019-12-18 DIAGNOSIS — R5383 Other fatigue: Secondary | ICD-10-CM | POA: Diagnosis not present

## 2019-12-18 DIAGNOSIS — M25512 Pain in left shoulder: Secondary | ICD-10-CM | POA: Diagnosis not present

## 2019-12-18 DIAGNOSIS — Z510 Encounter for antineoplastic radiation therapy: Secondary | ICD-10-CM | POA: Diagnosis not present

## 2019-12-18 DIAGNOSIS — Z5111 Encounter for antineoplastic chemotherapy: Secondary | ICD-10-CM | POA: Diagnosis not present

## 2019-12-18 DIAGNOSIS — J029 Acute pharyngitis, unspecified: Secondary | ICD-10-CM | POA: Diagnosis not present

## 2019-12-18 DIAGNOSIS — C760 Malignant neoplasm of head, face and neck: Secondary | ICD-10-CM | POA: Diagnosis not present

## 2019-12-18 DIAGNOSIS — R531 Weakness: Secondary | ICD-10-CM | POA: Diagnosis not present

## 2019-12-18 DIAGNOSIS — D696 Thrombocytopenia, unspecified: Secondary | ICD-10-CM | POA: Diagnosis not present

## 2019-12-18 NOTE — Progress Notes (Signed)
Patient prescreened for appointment. Patient has no concerns or questions.  

## 2019-12-19 ENCOUNTER — Other Ambulatory Visit: Payer: Self-pay

## 2019-12-19 ENCOUNTER — Ambulatory Visit
Admission: RE | Admit: 2019-12-19 | Discharge: 2019-12-19 | Disposition: A | Discharge status: Home / Self Care | Payer: Medicare HMO | Source: Ambulatory Visit | Attending: Radiation Oncology | Admitting: Radiation Oncology

## 2019-12-19 ENCOUNTER — Encounter: Payer: Self-pay | Admitting: Oncology

## 2019-12-19 ENCOUNTER — Inpatient Hospital Stay: Payer: Medicare HMO

## 2019-12-19 ENCOUNTER — Inpatient Hospital Stay (HOSPITAL_BASED_OUTPATIENT_CLINIC_OR_DEPARTMENT_OTHER): Payer: Medicare HMO | Admitting: Oncology

## 2019-12-19 VITALS — BP 108/72 | HR 77 | Temp 97.8°F | Resp 18 | Wt 129.1 lb

## 2019-12-19 VITALS — BP 108/72 | HR 77 | Temp 97.8°F | Resp 18 | Wt 129.0 lb

## 2019-12-19 DIAGNOSIS — C760 Malignant neoplasm of head, face and neck: Secondary | ICD-10-CM

## 2019-12-19 DIAGNOSIS — R531 Weakness: Secondary | ICD-10-CM | POA: Diagnosis not present

## 2019-12-19 DIAGNOSIS — D696 Thrombocytopenia, unspecified: Secondary | ICD-10-CM | POA: Diagnosis not present

## 2019-12-19 DIAGNOSIS — R5383 Other fatigue: Secondary | ICD-10-CM | POA: Diagnosis not present

## 2019-12-19 DIAGNOSIS — Z510 Encounter for antineoplastic radiation therapy: Secondary | ICD-10-CM | POA: Diagnosis not present

## 2019-12-19 DIAGNOSIS — J029 Acute pharyngitis, unspecified: Secondary | ICD-10-CM | POA: Diagnosis not present

## 2019-12-19 DIAGNOSIS — Z5111 Encounter for antineoplastic chemotherapy: Secondary | ICD-10-CM | POA: Diagnosis not present

## 2019-12-19 DIAGNOSIS — M25512 Pain in left shoulder: Secondary | ICD-10-CM | POA: Diagnosis not present

## 2019-12-19 DIAGNOSIS — R1310 Dysphagia, unspecified: Secondary | ICD-10-CM | POA: Diagnosis not present

## 2019-12-19 DIAGNOSIS — R42 Dizziness and giddiness: Secondary | ICD-10-CM | POA: Diagnosis not present

## 2019-12-19 LAB — CBC WITH DIFFERENTIAL/PLATELET
Abs Immature Granulocytes: 0.01 10*3/uL (ref 0.00–0.07)
Basophils Absolute: 0 10*3/uL (ref 0.0–0.1)
Basophils Relative: 1 %
Eosinophils Absolute: 0.1 10*3/uL (ref 0.0–0.5)
Eosinophils Relative: 2 %
HCT: 37 % — ABNORMAL LOW (ref 39.0–52.0)
Hemoglobin: 12.5 g/dL — ABNORMAL LOW (ref 13.0–17.0)
Immature Granulocytes: 0 %
Lymphocytes Relative: 9 %
Lymphs Abs: 0.5 10*3/uL — ABNORMAL LOW (ref 0.7–4.0)
MCH: 29.4 pg (ref 26.0–34.0)
MCHC: 33.8 g/dL (ref 30.0–36.0)
MCV: 87.1 fL (ref 80.0–100.0)
Monocytes Absolute: 0.6 10*3/uL (ref 0.1–1.0)
Monocytes Relative: 12 %
Neutro Abs: 3.9 10*3/uL (ref 1.7–7.7)
Neutrophils Relative %: 76 %
Platelets: 159 10*3/uL (ref 150–400)
RBC: 4.25 MIL/uL (ref 4.22–5.81)
RDW: 13.3 % (ref 11.5–15.5)
WBC: 5.1 10*3/uL (ref 4.0–10.5)
nRBC: 0 % (ref 0.0–0.2)

## 2019-12-19 LAB — COMPREHENSIVE METABOLIC PANEL WITH GFR
ALT: 11 U/L (ref 0–44)
AST: 15 U/L (ref 15–41)
Albumin: 3.3 g/dL — ABNORMAL LOW (ref 3.5–5.0)
Alkaline Phosphatase: 96 U/L (ref 38–126)
Anion gap: 9 (ref 5–15)
BUN: 12 mg/dL (ref 8–23)
CO2: 29 mmol/L (ref 22–32)
Calcium: 8.9 mg/dL (ref 8.9–10.3)
Chloride: 99 mmol/L (ref 98–111)
Creatinine, Ser: 1.21 mg/dL (ref 0.61–1.24)
GFR calc Af Amer: >60 mL/min
GFR calc non Af Amer: >60 mL/min
Glucose, Bld: 102 mg/dL — ABNORMAL HIGH (ref 70–99)
Potassium: 3.6 mmol/L (ref 3.5–5.1)
Sodium: 137 mmol/L (ref 135–145)
Total Bilirubin: 0.5 mg/dL (ref 0.3–1.2)
Total Protein: 6.7 g/dL (ref 6.5–8.1)

## 2019-12-19 MED ORDER — POTASSIUM CHLORIDE 2 MEQ/ML IV SOLN
Freq: Once | INTRAVENOUS | Status: AC
  Filled 2019-12-19: qty 1000

## 2019-12-19 MED ORDER — PALONOSETRON HCL INJECTION 0.25 MG/5ML
0.2500 mg | Freq: Once | INTRAVENOUS | Status: AC
  Administered 2019-12-19: 12:00:00 0.25 mg via INTRAVENOUS
  Filled 2019-12-19: qty 5

## 2019-12-19 MED ORDER — SODIUM CHLORIDE 0.9 % IV SOLN
Freq: Once | INTRAVENOUS | Status: AC
  Filled 2019-12-19: qty 250

## 2019-12-19 MED ORDER — SODIUM CHLORIDE 0.9 % IV SOLN
10.0000 mg | Freq: Once | INTRAVENOUS | Status: AC
  Administered 2019-12-19: 10 mg via INTRAVENOUS
  Filled 2019-12-19: qty 10

## 2019-12-19 MED ORDER — SODIUM CHLORIDE 0.9 % IV SOLN
40.0000 mg/m2 | Freq: Once | INTRAVENOUS | Status: AC
  Administered 2019-12-19: 70 mg via INTRAVENOUS
  Filled 2019-12-19: qty 70

## 2019-12-19 MED ORDER — SODIUM CHLORIDE 0.9 % IV SOLN
150.0000 mg | Freq: Once | INTRAVENOUS | Status: AC
  Administered 2019-12-19: 12:00:00 150 mg via INTRAVENOUS
  Filled 2019-12-19: qty 5

## 2019-12-19 NOTE — Progress Notes (Signed)
[place]  Telephone:(336) [phone] Fax:(336) [phone]  ID: MAHMOUD BLAZEJEWSKI OB: 01-18-1956  MR#: [id number]  WER#:[id number]  Patient Care Team: Zachary American, FNP as PCP - General (Family Medicine) Lloyd Huger, MD as Consulting Physician (Hematology and Oncology)  CHIEF COMPLAINT: Stage IVa squamous cell carcinoma of head and neck  INTERVAL HISTORY: Patient returns to clinic today for further evaluation and consideration of cycle 6 of weekly cisplatin.  He continues to have worsening dysphagia and states Carafate does not much help.  He otherwise feels well. He has no neurologic complaints.  He denies any recent fevers or illnesses.  He has a good appetite and denies weight loss.  He has no chest pain, shortness of breath, cough, or hemoptysis.  He denies any nausea, vomiting, constipation, or diarrhea.  He has no urinary complaints.  Patient offers no further specific complaints today.  REVIEW OF SYSTEMS:   Review of Systems  Constitutional: Negative.  Negative for fever, malaise/fatigue and weight loss.  HENT: Positive for sore throat.   Respiratory: Negative.  Negative for cough and shortness of breath.   Cardiovascular: Negative.  Negative for chest pain and leg swelling.  Gastrointestinal: Negative.  Negative for abdominal pain, blood in stool and melena.  Genitourinary: Negative.  Negative for dysuria.  Musculoskeletal: Negative.  Negative for back pain, joint pain and neck pain.  Skin: Negative.  Negative for rash.  Neurological: Negative for dizziness, focal weakness, weakness and headaches.  Psychiatric/Behavioral: Negative.  The patient is not nervous/anxious.     As per HPI. Otherwise, a complete review of systems is negative.  PAST MEDICAL HISTORY: Past Medical History:  Diagnosis Date  . Alcohol abuse   . Colorectal cancer ([place])   . GERD (gastroesophageal reflux disease)   . Hepatitis   . History of substance abuse ([place])   .  Neuropathy   . Osteoarthritis   . Prostate cancer ([place])   . Seizures ([place])    as a child  . Shoulder pain   . Tobacco use     PAST SURGICAL HISTORY: Past Surgical History:  Procedure Laterality Date  . CARDIAC CATHETERIZATION N/A 10/03/2016   Procedure: Left Heart Cath and Coronary Angiography;  Surgeon: Corey Skains, MD;  Location: [facility];  Service: Cardiovascular;  Laterality: N/A;  . COLON SURGERY    . colorectal cancer     "scraped off"   . PROSTATE BIOPSY    . SHOULDER ARTHROSCOPY WITH BICEPS TENDON REPAIR Right 10/03/2016   Procedure: SHOULDER ARTHROSCOPIC LIMITED DEBRIDEMENT  WITH BICEPS TENOLYSIS;  Surgeon: Corky Mull, MD;  Location: [facility];  Service: Orthopedics;  Laterality: Right;  . Stomach ulcers      FAMILY HISTORY: Family History  Problem Relation Age of Onset  . Heart attack Mother   . Colon cancer Father   . Cancer Father        prostate  . Cancer Brother        prostate    ADVANCED DIRECTIVES (Y/N):  N  HEALTH MAINTENANCE: Social History   Tobacco Use  . Smoking status: Current Every Day Smoker    Packs/day: 0.50    Years: 45.00    Pack years: 22.50    Types: Cigarettes  . Smokeless tobacco: Never Used  Substance Use Topics  . Alcohol use: Yes    Comment: occasional glass of wine  . Drug use: Yes    Types: Marijuana     Colonoscopy:  PAP:  Bone  density:  Lipid panel:  No Known Allergies  Current Outpatient Medications  Medication Sig Dispense Refill  . acetaminophen (TYLENOL) 500 MG tablet Take 500 mg by mouth every 6 (six) hours as needed for mild pain.     . cholecalciferol (VITAMIN D3) 25 MCG (1000 UNIT) tablet Take 1,000 Units by mouth daily.    [name] docusate sodium (COLACE) 100 MG capsule Take 100 mg by mouth at bedtime.     . donepezil (ARICEPT) 10 MG tablet Take 10 mg by mouth at bedtime.     . gabapentin (NEURONTIN) 300 MG capsule Take 300 mg by mouth 3 (three) times daily.     . magic mouthwash SOLN Take  5-10 mLs by mouth 4 (four) times daily as needed for mouth pain. 480 mL 0  . mirtazapine (REMERON) 7.5 MG tablet Take 7.5 mg by mouth at bedtime.     . naltrexone (DEPADE) 50 MG tablet Take 50 mg by mouth daily.    . naproxen (NAPROSYN) 250 MG tablet Take 500 mg by mouth 2 (two) times daily with a meal.     . ondansetron (ZOFRAN) 8 MG tablet Take 1 tablet (8 mg total) by mouth 2 (two) times daily as needed for nausea or vomiting. 60 tablet 1  . prochlorperazine (COMPAZINE) 10 MG tablet Take 1 tablet (10 mg total) by mouth every 6 (six) hours as needed for nausea or vomiting. 60 tablet 2  . sucralfate (CARAFATE) 1 g tablet Take 1 tablet (1 g total) by mouth 3 (three) times daily. Dissolve in 3-4 tbsp warm water, swish and swallow. 90 tablet 3  . tamsulosin (FLOMAX) 0.4 MG CAPS capsule Take 0.4 mg by mouth daily after supper.      No current facility-administered medications for this visit.    OBJECTIVE: Vitals:   12/26/19 0849  BP: 122/79  Pulse: 69  Resp: 18  Temp: (!) 97.2 F (36.2 C)  SpO2: 98%     Body mass index is 19.01 kg/m.    ECOG FS:0 - Asymptomatic  General: Well-developed, well-nourished, no acute distress. Eyes: Pink conjunctiva, anicteric sclera. HEENT: Normocephalic, moist mucous membranes.  No palpable lymphadenopathy. Lungs: No audible wheezing or coughing. Heart: Regular rate and rhythm. Abdomen: Soft, nontender, no obvious distention. Musculoskeletal: No edema, cyanosis, or clubbing. Neuro: Alert, answering all questions appropriately. Cranial nerves grossly intact. Skin: No rashes or petechiae noted. Psych: Normal affect.   LAB RESULTS:  Lab Results  Component Value Date   NA 134 (L) 12/26/2019   K 4.3 12/26/2019   CL 96 (L) 12/26/2019   CO2 29 12/26/2019   GLUCOSE 118 (H) 12/26/2019   BUN 15 12/26/2019   CREATININE 1.13 12/26/2019   CALCIUM 8.7 (L) 12/26/2019   PROT 6.7 12/19/2019   ALBUMIN 3.3 (L) 12/19/2019   AST 15 12/19/2019   ALT 11  12/19/2019   ALKPHOS 96 12/19/2019   BILITOT 0.5 12/19/2019   GFRNONAA >60 12/26/2019   GFRAA >60 12/26/2019    Lab Results  Component Value Date   WBC 5.9 12/26/2019   NEUTROABS 4.9 12/26/2019   HGB 12.9 (L) 12/26/2019   HCT 38.1 (L) 12/26/2019   MCV 87.4 12/26/2019   PLT 139 (L) 12/26/2019     STUDIES: No results found.  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  PLAN:    1.  Stage IVa squamous cell carcinoma of head and neck: Biopsy results from October 08, 2019 consistent with squamous cell carcinoma.  PET  imaging from February 2021 reviewed independently with large lymph node conglomerate on right neck, but no obvious source of primary.  Case was discussed with ENT and determined no further biopsies or surgeries are necessary as this would not alter patient's treatment moving forward.  Continue with daily XRT completing treatment on January 08, 2020.  Proceed with cycle 6 of weekly cisplatin today.  Return to clinic in 1 week for further evaluation and consideration of cycle 7.   2.  Shoulder pain: Patient does not complain of this today.  Monitor.  Continue Tylenol as needed. 3.  Dysphagia: Patient states Carafate did not help, therefore was given a prescription for Magic mouthwash today. 4.  Thrombocytopenia: Mild, monitor.   Patient expressed understanding and was in agreement with this plan. He also understands that He can call clinic at any time with any questions, concerns, or complaints.   Cancer Staging Squamous cell carcinoma of head and neck (HCC) Staging form: Cervical Lymph Nodes and Unknown Primary Tumors of the Head and Neck, AJCC 8th Edition - Clinical stage from 11/07/2019: Stage IVA (cT0, cN2a, cM0) - Signed by Lloyd Huger, MD on 11/07/2019   Lloyd Huger, MD   12/26/2019 9:23 AM

## 2019-12-22 ENCOUNTER — Ambulatory Visit
Admission: RE | Admit: 2019-12-22 | Discharge: 2019-12-22 | Disposition: A | Discharge status: Home / Self Care | Payer: Medicare HMO | Source: Ambulatory Visit | Attending: Radiation Oncology | Admitting: Radiation Oncology

## 2019-12-22 DIAGNOSIS — Z510 Encounter for antineoplastic radiation therapy: Secondary | ICD-10-CM | POA: Diagnosis not present

## 2019-12-22 DIAGNOSIS — C760 Malignant neoplasm of head, face and neck: Secondary | ICD-10-CM | POA: Diagnosis not present

## 2019-12-23 ENCOUNTER — Ambulatory Visit
Admission: RE | Admit: 2019-12-23 | Discharge: 2019-12-23 | Disposition: A | Discharge status: Home / Self Care | Payer: Medicare HMO | Source: Ambulatory Visit | Attending: Radiation Oncology | Admitting: Radiation Oncology

## 2019-12-23 DIAGNOSIS — Z510 Encounter for antineoplastic radiation therapy: Secondary | ICD-10-CM | POA: Diagnosis not present

## 2019-12-23 DIAGNOSIS — C760 Malignant neoplasm of head, face and neck: Secondary | ICD-10-CM | POA: Diagnosis not present

## 2019-12-24 ENCOUNTER — Ambulatory Visit: Payer: Medicare HMO

## 2019-12-25 ENCOUNTER — Encounter: Payer: Self-pay | Admitting: Oncology

## 2019-12-25 ENCOUNTER — Ambulatory Visit
Admission: RE | Admit: 2019-12-25 | Discharge: 2019-12-25 | Disposition: A | Discharge status: Home / Self Care | Payer: Medicare HMO | Source: Ambulatory Visit | Attending: Radiation Oncology | Admitting: Radiation Oncology

## 2019-12-25 DIAGNOSIS — K219 Gastro-esophageal reflux disease without esophagitis: Secondary | ICD-10-CM | POA: Diagnosis not present

## 2019-12-25 DIAGNOSIS — B182 Chronic viral hepatitis C: Secondary | ICD-10-CM | POA: Diagnosis not present

## 2019-12-25 DIAGNOSIS — R972 Elevated prostate specific antigen [PSA]: Secondary | ICD-10-CM | POA: Diagnosis not present

## 2019-12-25 DIAGNOSIS — F1027 Alcohol dependence with alcohol-induced persisting dementia: Secondary | ICD-10-CM | POA: Diagnosis not present

## 2019-12-25 DIAGNOSIS — C760 Malignant neoplasm of head, face and neck: Secondary | ICD-10-CM | POA: Diagnosis not present

## 2019-12-25 DIAGNOSIS — F1010 Alcohol abuse, uncomplicated: Secondary | ICD-10-CM | POA: Diagnosis not present

## 2019-12-25 DIAGNOSIS — Z79899 Other long term (current) drug therapy: Secondary | ICD-10-CM | POA: Diagnosis not present

## 2019-12-25 DIAGNOSIS — Z510 Encounter for antineoplastic radiation therapy: Secondary | ICD-10-CM | POA: Diagnosis not present

## 2019-12-25 NOTE — Progress Notes (Signed)

## 2019-12-26 ENCOUNTER — Encounter: Payer: Self-pay | Admitting: Oncology

## 2019-12-26 ENCOUNTER — Inpatient Hospital Stay: Payer: Medicare HMO

## 2019-12-26 ENCOUNTER — Ambulatory Visit
Admission: RE | Admit: 2019-12-26 | Discharge: 2019-12-26 | Disposition: A | Discharge status: Home / Self Care | Payer: Medicare HMO | Source: Ambulatory Visit | Attending: Radiation Oncology | Admitting: Radiation Oncology

## 2019-12-26 ENCOUNTER — Inpatient Hospital Stay (HOSPITAL_BASED_OUTPATIENT_CLINIC_OR_DEPARTMENT_OTHER): Payer: Medicare HMO | Admitting: Oncology

## 2019-12-26 ENCOUNTER — Other Ambulatory Visit: Payer: Self-pay

## 2019-12-26 VITALS — BP 122/79 | HR 69 | Temp 97.2°F | Resp 18 | Wt 128.7 lb

## 2019-12-26 DIAGNOSIS — R1310 Dysphagia, unspecified: Secondary | ICD-10-CM | POA: Diagnosis not present

## 2019-12-26 DIAGNOSIS — R5383 Other fatigue: Secondary | ICD-10-CM | POA: Diagnosis not present

## 2019-12-26 DIAGNOSIS — C760 Malignant neoplasm of head, face and neck: Secondary | ICD-10-CM

## 2019-12-26 DIAGNOSIS — J029 Acute pharyngitis, unspecified: Secondary | ICD-10-CM | POA: Diagnosis not present

## 2019-12-26 DIAGNOSIS — Z5111 Encounter for antineoplastic chemotherapy: Secondary | ICD-10-CM | POA: Diagnosis not present

## 2019-12-26 DIAGNOSIS — Z510 Encounter for antineoplastic radiation therapy: Secondary | ICD-10-CM | POA: Diagnosis not present

## 2019-12-26 DIAGNOSIS — R42 Dizziness and giddiness: Secondary | ICD-10-CM | POA: Diagnosis not present

## 2019-12-26 DIAGNOSIS — R531 Weakness: Secondary | ICD-10-CM | POA: Diagnosis not present

## 2019-12-26 DIAGNOSIS — M25512 Pain in left shoulder: Secondary | ICD-10-CM | POA: Diagnosis not present

## 2019-12-26 DIAGNOSIS — D696 Thrombocytopenia, unspecified: Secondary | ICD-10-CM | POA: Diagnosis not present

## 2019-12-26 LAB — BASIC METABOLIC PANEL
Anion gap: 9 (ref 5–15)
BUN: 15 mg/dL (ref 8–23)
CO2: 29 mmol/L (ref 22–32)
Calcium: 8.7 mg/dL — ABNORMAL LOW (ref 8.9–10.3)
Chloride: 96 mmol/L — ABNORMAL LOW (ref 98–111)
Creatinine, Ser: 1.13 mg/dL (ref 0.61–1.24)
GFR calc Af Amer: >60 mL/min (ref >60)
GFR calc non Af Amer: >60 mL/min (ref >60)
Glucose, Bld: 118 mg/dL — ABNORMAL HIGH (ref 70–99)
Potassium: 4.3 mmol/L (ref 3.5–5.1)
Sodium: 134 mmol/L — ABNORMAL LOW (ref 135–145)

## 2019-12-26 LAB — CBC WITH DIFFERENTIAL/PLATELET
Abs Immature Granulocytes: 0.03 10*3/uL (ref 0.00–0.07)
Basophils Absolute: 0 10*3/uL (ref 0.0–0.1)
Basophils Relative: 1 %
Eosinophils Absolute: 0 10*3/uL (ref 0.0–0.5)
Eosinophils Relative: 1 %
HCT: 38.1 % — ABNORMAL LOW (ref 39.0–52.0)
Hemoglobin: 12.9 g/dL — ABNORMAL LOW (ref 13.0–17.0)
Immature Granulocytes: 1 %
Lymphocytes Relative: 6 %
Lymphs Abs: 0.4 10*3/uL — ABNORMAL LOW (ref 0.7–4.0)
MCH: 29.6 pg (ref 26.0–34.0)
MCHC: 33.9 g/dL (ref 30.0–36.0)
MCV: 87.4 fL (ref 80.0–100.0)
Monocytes Absolute: 0.5 10*3/uL (ref 0.1–1.0)
Monocytes Relative: 9 %
Neutro Abs: 4.9 10*3/uL (ref 1.7–7.7)
Neutrophils Relative %: 82 %
Platelets: 139 10*3/uL — ABNORMAL LOW (ref 150–400)
RBC: 4.36 MIL/uL (ref 4.22–5.81)
RDW: 13.6 % (ref 11.5–15.5)
WBC: 5.9 10*3/uL (ref 4.0–10.5)
nRBC: 0 % (ref 0.0–0.2)

## 2019-12-26 MED ORDER — SODIUM CHLORIDE 0.9 % IV SOLN
40.0000 mg/m2 | Freq: Once | INTRAVENOUS | Status: AC
  Administered 2019-12-26: 70 mg via INTRAVENOUS
  Filled 2019-12-26: qty 70

## 2019-12-26 MED ORDER — PALONOSETRON HCL INJECTION 0.25 MG/5ML
0.2500 mg | Freq: Once | INTRAVENOUS | Status: AC
  Administered 2019-12-26: 0.25 mg via INTRAVENOUS
  Filled 2019-12-26: qty 5

## 2019-12-26 MED ORDER — SODIUM CHLORIDE 0.9 % IV SOLN
10.0000 mg | Freq: Once | INTRAVENOUS | Status: AC
  Administered 2019-12-26: 10 mg via INTRAVENOUS
  Filled 2019-12-26: qty 10

## 2019-12-26 MED ORDER — POTASSIUM CHLORIDE 2 MEQ/ML IV SOLN
Freq: Once | INTRAVENOUS | Status: AC
  Filled 2019-12-26: qty 1000

## 2019-12-26 MED ORDER — MAGIC MOUTHWASH: 5.0000 mL | Freq: Four times a day (QID) | ORAL | 0 refills | Status: DC | PRN

## 2019-12-26 MED ORDER — SODIUM CHLORIDE 0.9 % IV SOLN
150.0000 mg | Freq: Once | INTRAVENOUS | Status: AC
  Administered 2019-12-26: 12:00:00 150 mg via INTRAVENOUS
  Filled 2019-12-26: qty 5

## 2019-12-26 MED ORDER — SODIUM CHLORIDE 0.9 % IV SOLN
Freq: Once | INTRAVENOUS | Status: AC
  Filled 2019-12-26: qty 250

## 2019-12-26 NOTE — Progress Notes (Signed)
Patient here for follow up today. Reports no concerns or pain.

## 2019-12-27 NOTE — Progress Notes (Signed)
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 He has increased weakness and fatigue and occasional dizziness. He reports poor p.o. intake secondary to dysphagia. He did not pick up his Magic mouthwash prescription. He has no other neurologic complaints. He denies any recent fevers or illnesses.  He has no chest pain, shortness of breath, cough, or hemoptysis.  He denies any nausea, vomiting, constipation, or diarrhea.  He has no urinary complaints. Patient offers no further specific complaints today.  REVIEW OF SYSTEMS:   Review of Systems  Constitutional: Negative.  Negative for fever, malaise/fatigue and weight loss.  HENT: Positive for sore throat.   Respiratory: Negative.  Negative for cough and shortness of breath.   Cardiovascular: Negative.  Negative for chest pain and leg swelling.  Gastrointestinal: Negative.  Negative for abdominal pain, blood in stool and melena.  Genitourinary: Negative.  Negative for dysuria.  Musculoskeletal: Negative.  Negative for back pain, joint pain and neck pain.  Skin: Negative.  Negative for rash.  Neurological: Negative for dizziness, focal weakness, weakness and headaches.  Psychiatric/Behavioral: Negative.  The patient is not nervous/anxious.     As per HPI. Otherwise, a complete review of systems is negative.  PAST MEDICAL HISTORY: Past Medical History:  Diagnosis Date  . Alcohol abuse   . Colorectal cancer ([place])   . GERD (gastroesophageal reflux disease)   . Hepatitis   .  History of substance abuse ([place])   . Neuropathy   . Osteoarthritis   . Prostate cancer ([place])   . Seizures ([place])    as a child  . Shoulder pain   . Tobacco use     PAST SURGICAL HISTORY: Past Surgical History:  Procedure Laterality Date  . CARDIAC CATHETERIZATION N/A 10/03/2016   Procedure: Left Heart Cath and Coronary Angiography;  Surgeon: Corey Skains, MD;  Location: [facility];  Service: Cardiovascular;  Laterality: N/A;  . COLON SURGERY    . colorectal cancer     "scraped off"   . PROSTATE BIOPSY    . SHOULDER ARTHROSCOPY WITH BICEPS TENDON REPAIR Right 10/03/2016   Procedure: SHOULDER ARTHROSCOPIC LIMITED DEBRIDEMENT  WITH BICEPS TENOLYSIS;  Surgeon: Corky Mull, MD;  Location: [facility];  Service: Orthopedics;  Laterality: Right;  . Stomach ulcers      FAMILY HISTORY: Family History  Problem Relation Age of Onset  . Heart attack Mother   . Colon cancer Father   . Cancer Father        prostate  . Cancer Brother        prostate    ADVANCED DIRECTIVES (Y/N):  N  HEALTH MAINTENANCE: Social History   Tobacco Use  . Smoking status: Current Every Day Smoker    Packs/day: 0.50    Years: 45.00    Pack years: 22.50    Types: Cigarettes  . Smokeless tobacco: Never Used  Substance Use Topics  . Alcohol use: Yes    Comment: occasional glass of wine  . Drug use: Yes    Types: Marijuana     Colonoscopy:  PAP:  Bone density:  Lipid panel:  No Known Allergies  Current Outpatient Medications  Medication Sig Dispense Refill  . acetaminophen (TYLENOL) 500 MG tablet Take 500 mg by mouth every 6 (six) hours as needed for mild pain.     . cholecalciferol (VITAMIN D3) 25 MCG (1000 UNIT) tablet Take 1,000 Units by mouth daily.    [name] docusate sodium (COLACE) 100 MG capsule Take 100 mg by mouth at bedtime.     . donepezil (ARICEPT) 10 MG tablet Take 10 mg by mouth at bedtime.     . gabapentin (NEURONTIN) 300 MG capsule Take 300 mg by mouth 3 (three) times  daily.     . magic mouthwash SOLN Take 5-10 mLs by mouth 4 (four) times daily as needed for mouth pain. 480 mL 0  . mirtazapine (REMERON) 7.5 MG tablet Take 7.5 mg by mouth at bedtime.     . naltrexone (DEPADE) 50 MG tablet Take 50 mg by mouth daily.    . naproxen (NAPROSYN) 250 MG tablet Take 500 mg by mouth 2 (two) times daily with a meal.     . ondansetron (ZOFRAN) 8 MG tablet Take 1 tablet (8 mg total) by mouth 2 (two) times daily as needed for nausea or vomiting. 60 tablet 1  . prochlorperazine (COMPAZINE) 10 MG tablet Take 1 tablet (10 mg total) by mouth every 6 (six) hours as needed for nausea or vomiting. 60 tablet 2  . sucralfate (CARAFATE) 1 g tablet Take 1 tablet (1 g total) by mouth 3 (three) times daily. Dissolve in 3-4 tbsp warm water, swish and swallow. 90 tablet 3  . tamsulosin (FLOMAX) 0.4 MG CAPS capsule Take 0.4 mg by mouth daily after supper.      No current facility-administered medications for this visit.   Facility-Administered Medications Ordered in Other Visits  Medication Dose Route Frequency Provider Last Rate Last Admin  . CISplatin (PLATINOL) 70 mg in sodium chloride 0.9 % 250 mL chemo infusion  40 mg/m2 (Treatment Plan Recorded) Intravenous Once Lloyd Huger, MD      . dexamethasone (DECADRON) 10 mg in sodium chloride 0.9 % 50 mL IVPB  10 mg Intravenous Once Lloyd Huger, MD      . fosaprepitant (EMEND) 150 mg in sodium chloride 0.9 % 145 mL IVPB  150 mg Intravenous Once Lloyd Huger, MD      . palonosetron (ALOXI) injection 0.25 mg  0.25 mg Intravenous Once Lloyd Huger, MD        OBJECTIVE: Vitals:   01/02/20 0849  BP: 104/77  Pulse: 80  Resp: 18  Temp: (!) 96.8 F (36 C)  SpO2: 98%     Body mass index is 18.96 kg/m.    ECOG FS:0 - Asymptomatic  General: Well-developed, well-nourished, no acute distress. Eyes: Pink conjunctiva, anicteric sclera. HEENT: Normocephalic, moist mucous membranes. No palpable  lymphadenopathy. Lungs: No audible wheezing or coughing. Heart: Regular rate and rhythm. Abdomen: Soft, nontender, no obvious distention. Musculoskeletal: No edema, cyanosis, or clubbing. Neuro: Alert, answering all questions appropriately. Cranial nerves grossly intact. Skin: No rashes or petechiae noted. Psych: Normal affect.   LAB RESULTS:  Lab Results  Component Value Date   NA 132 (L) 01/02/2020   K 4.1 01/02/2020   CL 94 (L) 01/02/2020   CO2 28 01/02/2020   GLUCOSE 106 (H) 01/02/2020   BUN 15 01/02/2020   CREATININE 0.99 01/02/2020   CALCIUM 8.8 (L) 01/02/2020   PROT 6.7 12/19/2019   ALBUMIN 3.3 (L) 12/19/2019  AST 15 12/19/2019   ALT 11 12/19/2019   ALKPHOS 96 12/19/2019   BILITOT 0.5 12/19/2019   GFRNONAA >60 01/02/2020   GFRAA >60 01/02/2020    Lab Results  Component Value Date   WBC 3.6 (L) 01/02/2020   NEUTROABS 2.7 01/02/2020   HGB 11.8 (L) 01/02/2020   HCT 33.2 (L) 01/02/2020   MCV 84.1 01/02/2020   PLT 138 (L) 01/02/2020     STUDIES: No results found.  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  PLAN:    1.  Stage IVa squamous cell carcinoma of head and neck: Biopsy results from October 08, 2019 consistent with squamous cell carcinoma.  PET imaging from February 2021 reviewed independently with large lymph node conglomerate on right neck, but no obvious source of primary.  Case was discussed with ENT and determined no further biopsies or surgeries are necessary as this would not alter patient's treatment moving forward.  Continue with daily XRT completing treatment on January 08, 2020. Proceed with patient's seventh and final treatment of weekly cisplatin today. Return to clinic next Thursday on the final day of radiation for repeat laboratory work and IV fluids. We will repeat PET scan approximately 2 months after completion of treatment. 2.  Shoulder pain: Patient does not complain of this today.  Monitor.  Continue Tylenol as  needed. 3.  Dysphagia: Patient states Carafate does not help, but he did not pick up his prescription for Magic mouthwash. 4. Thrombocytopenia: Chronic and unchanged. Monitor.   Patient expressed understanding and was in agreement with this plan. He also understands that He can call clinic at any time with any questions, concerns, or complaints.   Cancer Staging Squamous cell carcinoma of head and neck (HCC) Staging form: Cervical Lymph Nodes and Unknown Primary Tumors of the Head and Neck, AJCC 8th Edition - Clinical stage from 11/07/2019: Stage IVA (cT0, cN2a, cM0) - Signed by Lloyd Huger, MD on 11/07/2019   Lloyd Huger, MD   01/02/2020 10:27 AM

## 2019-12-29 ENCOUNTER — Ambulatory Visit
Admission: RE | Admit: 2019-12-29 | Discharge: 2019-12-29 | Disposition: A | Discharge status: Home / Self Care | Payer: Medicare HMO | Source: Ambulatory Visit | Attending: Radiation Oncology | Admitting: Radiation Oncology

## 2019-12-29 DIAGNOSIS — C760 Malignant neoplasm of head, face and neck: Secondary | ICD-10-CM | POA: Diagnosis not present

## 2019-12-29 DIAGNOSIS — Z510 Encounter for antineoplastic radiation therapy: Secondary | ICD-10-CM | POA: Diagnosis not present

## 2019-12-30 ENCOUNTER — Ambulatory Visit
Admission: RE | Admit: 2019-12-30 | Discharge: 2019-12-30 | Disposition: A | Discharge status: Home / Self Care | Payer: Medicare HMO | Source: Ambulatory Visit | Attending: Radiation Oncology | Admitting: Radiation Oncology

## 2019-12-30 DIAGNOSIS — C760 Malignant neoplasm of head, face and neck: Secondary | ICD-10-CM | POA: Diagnosis not present

## 2019-12-30 DIAGNOSIS — Z510 Encounter for antineoplastic radiation therapy: Secondary | ICD-10-CM | POA: Diagnosis not present

## 2019-12-31 ENCOUNTER — Ambulatory Visit
Admission: RE | Admit: 2019-12-31 | Discharge: 2019-12-31 | Disposition: A | Discharge status: Home / Self Care | Payer: Medicare HMO | Source: Ambulatory Visit | Attending: Radiation Oncology | Admitting: Radiation Oncology

## 2019-12-31 DIAGNOSIS — C760 Malignant neoplasm of head, face and neck: Secondary | ICD-10-CM | POA: Diagnosis not present

## 2019-12-31 DIAGNOSIS — Z510 Encounter for antineoplastic radiation therapy: Secondary | ICD-10-CM | POA: Diagnosis not present

## 2020-01-01 ENCOUNTER — Ambulatory Visit
Admission: RE | Admit: 2020-01-01 | Discharge: 2020-01-01 | Disposition: A | Discharge status: Home / Self Care | Payer: Medicare HMO | Source: Ambulatory Visit | Attending: Radiation Oncology | Admitting: Radiation Oncology

## 2020-01-01 DIAGNOSIS — C760 Malignant neoplasm of head, face and neck: Secondary | ICD-10-CM | POA: Diagnosis not present

## 2020-01-01 DIAGNOSIS — Z510 Encounter for antineoplastic radiation therapy: Secondary | ICD-10-CM | POA: Diagnosis not present

## 2020-01-02 ENCOUNTER — Inpatient Hospital Stay (HOSPITAL_BASED_OUTPATIENT_CLINIC_OR_DEPARTMENT_OTHER): Payer: Medicare HMO | Admitting: Oncology

## 2020-01-02 ENCOUNTER — Other Ambulatory Visit: Payer: Self-pay

## 2020-01-02 ENCOUNTER — Inpatient Hospital Stay: Payer: Medicare HMO

## 2020-01-02 ENCOUNTER — Ambulatory Visit
Admission: RE | Admit: 2020-01-02 | Discharge: 2020-01-02 | Disposition: A | Discharge status: Home / Self Care | Payer: Medicare HMO | Source: Ambulatory Visit | Attending: Radiation Oncology | Admitting: Radiation Oncology

## 2020-01-02 ENCOUNTER — Encounter: Payer: Self-pay | Admitting: Oncology

## 2020-01-02 VITALS — BP 104/77 | HR 80 | Temp 96.8°F | Resp 18 | Wt 128.4 lb

## 2020-01-02 DIAGNOSIS — R531 Weakness: Secondary | ICD-10-CM | POA: Diagnosis not present

## 2020-01-02 DIAGNOSIS — R42 Dizziness and giddiness: Secondary | ICD-10-CM | POA: Diagnosis not present

## 2020-01-02 DIAGNOSIS — R1310 Dysphagia, unspecified: Secondary | ICD-10-CM | POA: Diagnosis not present

## 2020-01-02 DIAGNOSIS — D696 Thrombocytopenia, unspecified: Secondary | ICD-10-CM | POA: Diagnosis not present

## 2020-01-02 DIAGNOSIS — Z510 Encounter for antineoplastic radiation therapy: Secondary | ICD-10-CM | POA: Diagnosis not present

## 2020-01-02 DIAGNOSIS — C760 Malignant neoplasm of head, face and neck: Secondary | ICD-10-CM

## 2020-01-02 DIAGNOSIS — J029 Acute pharyngitis, unspecified: Secondary | ICD-10-CM | POA: Diagnosis not present

## 2020-01-02 DIAGNOSIS — M25512 Pain in left shoulder: Secondary | ICD-10-CM | POA: Diagnosis not present

## 2020-01-02 DIAGNOSIS — R5383 Other fatigue: Secondary | ICD-10-CM | POA: Diagnosis not present

## 2020-01-02 DIAGNOSIS — Z5111 Encounter for antineoplastic chemotherapy: Secondary | ICD-10-CM | POA: Diagnosis not present

## 2020-01-02 LAB — BASIC METABOLIC PANEL
Anion gap: 10 (ref 5–15)
BUN: 15 mg/dL (ref 8–23)
CO2: 28 mmol/L (ref 22–32)
Calcium: 8.8 mg/dL — ABNORMAL LOW (ref 8.9–10.3)
Chloride: 94 mmol/L — ABNORMAL LOW (ref 98–111)
Creatinine, Ser: 0.99 mg/dL (ref 0.61–1.24)
GFR calc Af Amer: >60 mL/min (ref >60)
GFR calc non Af Amer: >60 mL/min (ref >60)
Glucose, Bld: 106 mg/dL — ABNORMAL HIGH (ref 70–99)
Potassium: 4.1 mmol/L (ref 3.5–5.1)
Sodium: 132 mmol/L — ABNORMAL LOW (ref 135–145)

## 2020-01-02 LAB — CBC WITH DIFFERENTIAL/PLATELET
Abs Immature Granulocytes: 0.02 10*3/uL (ref 0.00–0.07)
Basophils Absolute: 0 10*3/uL (ref 0.0–0.1)
Basophils Relative: 0 %
Eosinophils Absolute: 0 10*3/uL (ref 0.0–0.5)
Eosinophils Relative: 1 %
HCT: 33.2 % — ABNORMAL LOW (ref 39.0–52.0)
Hemoglobin: 11.8 g/dL — ABNORMAL LOW (ref 13.0–17.0)
Immature Granulocytes: 1 %
Lymphocytes Relative: 11 %
Lymphs Abs: 0.4 10*3/uL — ABNORMAL LOW (ref 0.7–4.0)
MCH: 29.9 pg (ref 26.0–34.0)
MCHC: 35.5 g/dL (ref 30.0–36.0)
MCV: 84.1 fL (ref 80.0–100.0)
Monocytes Absolute: 0.4 10*3/uL (ref 0.1–1.0)
Monocytes Relative: 12 %
Neutro Abs: 2.7 10*3/uL (ref 1.7–7.7)
Neutrophils Relative %: 75 %
Platelets: 138 10*3/uL — ABNORMAL LOW (ref 150–400)
RBC: 3.95 MIL/uL — ABNORMAL LOW (ref 4.22–5.81)
RDW: 13.7 % (ref 11.5–15.5)
WBC: 3.6 10*3/uL — ABNORMAL LOW (ref 4.0–10.5)
nRBC: 0 % (ref 0.0–0.2)

## 2020-01-02 MED ORDER — SODIUM CHLORIDE 0.9 % IV SOLN
40.0000 mg/m2 | Freq: Once | INTRAVENOUS | Status: AC
  Administered 2020-01-02: 13:00:00 70 mg via INTRAVENOUS
  Filled 2020-01-02: qty 70

## 2020-01-02 MED ORDER — SODIUM CHLORIDE 0.9 % IV SOLN
150.0000 mg | Freq: Once | INTRAVENOUS | Status: AC
  Administered 2020-01-02: 12:00:00 150 mg via INTRAVENOUS
  Filled 2020-01-02: qty 150

## 2020-01-02 MED ORDER — PALONOSETRON HCL INJECTION 0.25 MG/5ML
0.2500 mg | Freq: Once | INTRAVENOUS | Status: AC
  Administered 2020-01-02: 12:00:00 0.25 mg via INTRAVENOUS
  Filled 2020-01-02: qty 5

## 2020-01-02 MED ORDER — SODIUM CHLORIDE 0.9 % IV SOLN
10.0000 mg | Freq: Once | INTRAVENOUS | Status: AC
  Administered 2020-01-02: 10 mg via INTRAVENOUS
  Filled 2020-01-02: qty 10

## 2020-01-02 MED ORDER — POTASSIUM CHLORIDE 2 MEQ/ML IV SOLN
Freq: Once | INTRAVENOUS | Status: AC
  Filled 2020-01-02: qty 1000

## 2020-01-02 MED ORDER — SODIUM CHLORIDE 0.9 % IV SOLN
Freq: Once | INTRAVENOUS | Status: AC
  Filled 2020-01-02: qty 250

## 2020-01-02 NOTE — Progress Notes (Signed)
Patient reports having lots of weakness today. Noticed patient is swaying a lot more when trying to walk and reports some dizziness. Denies any chest pain or palpitations. Denies any pain. States been feeling this way for a day and half. States mouth is "kinda" sore and having problems eating.

## 2020-01-05 ENCOUNTER — Ambulatory Visit
Admission: RE | Admit: 2020-01-05 | Discharge: 2020-01-05 | Disposition: A | Discharge status: Home / Self Care | Payer: Medicare HMO | Source: Ambulatory Visit | Attending: Radiation Oncology | Admitting: Radiation Oncology

## 2020-01-05 DIAGNOSIS — Z510 Encounter for antineoplastic radiation therapy: Secondary | ICD-10-CM | POA: Diagnosis not present

## 2020-01-05 DIAGNOSIS — C760 Malignant neoplasm of head, face and neck: Secondary | ICD-10-CM | POA: Diagnosis not present

## 2020-01-06 ENCOUNTER — Ambulatory Visit
Admission: RE | Admit: 2020-01-06 | Discharge: 2020-01-06 | Disposition: A | Discharge status: Home / Self Care | Payer: Medicare HMO | Source: Ambulatory Visit | Attending: Radiation Oncology | Admitting: Radiation Oncology

## 2020-01-06 DIAGNOSIS — Z510 Encounter for antineoplastic radiation therapy: Secondary | ICD-10-CM | POA: Diagnosis not present

## 2020-01-06 DIAGNOSIS — C760 Malignant neoplasm of head, face and neck: Secondary | ICD-10-CM | POA: Diagnosis not present

## 2020-01-07 ENCOUNTER — Ambulatory Visit: Payer: Medicare HMO

## 2020-01-07 ENCOUNTER — Ambulatory Visit
Admission: RE | Admit: 2020-01-07 | Discharge: 2020-01-07 | Disposition: A | Discharge status: Home / Self Care | Payer: Medicare HMO | Source: Ambulatory Visit | Attending: Radiation Oncology | Admitting: Radiation Oncology

## 2020-01-07 DIAGNOSIS — Z510 Encounter for antineoplastic radiation therapy: Secondary | ICD-10-CM | POA: Diagnosis not present

## 2020-01-07 DIAGNOSIS — C760 Malignant neoplasm of head, face and neck: Secondary | ICD-10-CM | POA: Diagnosis not present

## 2020-01-08 ENCOUNTER — Inpatient Hospital Stay: Payer: Medicare HMO

## 2020-01-08 ENCOUNTER — Ambulatory Visit
Admission: RE | Admit: 2020-01-08 | Discharge: 2020-01-08 | Disposition: A | Discharge status: Home / Self Care | Payer: Medicare HMO | Source: Ambulatory Visit | Attending: Radiation Oncology | Admitting: Radiation Oncology

## 2020-01-08 ENCOUNTER — Other Ambulatory Visit: Payer: Self-pay

## 2020-01-08 VITALS — BP 95/63 | HR 95 | Resp 20

## 2020-01-08 DIAGNOSIS — R1310 Dysphagia, unspecified: Secondary | ICD-10-CM | POA: Diagnosis not present

## 2020-01-08 DIAGNOSIS — R42 Dizziness and giddiness: Secondary | ICD-10-CM | POA: Diagnosis not present

## 2020-01-08 DIAGNOSIS — C760 Malignant neoplasm of head, face and neck: Secondary | ICD-10-CM

## 2020-01-08 DIAGNOSIS — D696 Thrombocytopenia, unspecified: Secondary | ICD-10-CM | POA: Diagnosis not present

## 2020-01-08 DIAGNOSIS — Z5111 Encounter for antineoplastic chemotherapy: Secondary | ICD-10-CM | POA: Diagnosis not present

## 2020-01-08 DIAGNOSIS — M25512 Pain in left shoulder: Secondary | ICD-10-CM | POA: Diagnosis not present

## 2020-01-08 DIAGNOSIS — R531 Weakness: Secondary | ICD-10-CM | POA: Diagnosis not present

## 2020-01-08 DIAGNOSIS — Z510 Encounter for antineoplastic radiation therapy: Secondary | ICD-10-CM | POA: Diagnosis not present

## 2020-01-08 DIAGNOSIS — R5383 Other fatigue: Secondary | ICD-10-CM | POA: Diagnosis not present

## 2020-01-08 DIAGNOSIS — J029 Acute pharyngitis, unspecified: Secondary | ICD-10-CM | POA: Diagnosis not present

## 2020-01-08 LAB — CBC WITH DIFFERENTIAL/PLATELET
Abs Immature Granulocytes: 0.01 10*3/uL (ref 0.00–0.07)
Basophils Absolute: 0 10*3/uL (ref 0.0–0.1)
Basophils Relative: 1 %
Eosinophils Absolute: 0 10*3/uL (ref 0.0–0.5)
Eosinophils Relative: 1 %
HCT: 31.9 % — ABNORMAL LOW (ref 39.0–52.0)
Hemoglobin: 11 g/dL — ABNORMAL LOW (ref 13.0–17.0)
Immature Granulocytes: 1 %
Lymphocytes Relative: 14 %
Lymphs Abs: 0.3 10*3/uL — ABNORMAL LOW (ref 0.7–4.0)
MCH: 29.6 pg (ref 26.0–34.0)
MCHC: 34.5 g/dL (ref 30.0–36.0)
MCV: 86 fL (ref 80.0–100.0)
Monocytes Absolute: 0.4 10*3/uL (ref 0.1–1.0)
Monocytes Relative: 17 %
Neutro Abs: 1.5 10*3/uL — ABNORMAL LOW (ref 1.7–7.7)
Neutrophils Relative %: 66 %
Platelets: 121 10*3/uL — ABNORMAL LOW (ref 150–400)
RBC: 3.71 MIL/uL — ABNORMAL LOW (ref 4.22–5.81)
RDW: 14.7 % (ref 11.5–15.5)
WBC: 2.1 10*3/uL — ABNORMAL LOW (ref 4.0–10.5)
nRBC: 0 % (ref 0.0–0.2)

## 2020-01-08 LAB — BASIC METABOLIC PANEL
Anion gap: 11 (ref 5–15)
BUN: 21 mg/dL (ref 8–23)
CO2: 29 mmol/L (ref 22–32)
Calcium: 8.7 mg/dL — ABNORMAL LOW (ref 8.9–10.3)
Chloride: 93 mmol/L — ABNORMAL LOW (ref 98–111)
Creatinine, Ser: 1.16 mg/dL (ref 0.61–1.24)
GFR calc Af Amer: >60 mL/min (ref >60)
GFR calc non Af Amer: >60 mL/min (ref >60)
Glucose, Bld: 110 mg/dL — ABNORMAL HIGH (ref 70–99)
Potassium: 4.8 mmol/L (ref 3.5–5.1)
Sodium: 133 mmol/L — ABNORMAL LOW (ref 135–145)

## 2020-01-08 MED ORDER — SODIUM CHLORIDE 0.9 % IV SOLN
Freq: Once | INTRAVENOUS | Status: AC
  Filled 2020-01-08: qty 250

## 2020-01-13 ENCOUNTER — Telehealth: Payer: Self-pay | Admitting: *Deleted

## 2020-01-13 NOTE — Telephone Encounter (Signed)
Team gone for the day. Will fax first thing in the AM.

## 2020-01-13 NOTE — Telephone Encounter (Signed)
Facility called reporting htat patient has ]completed Chemoradiation and that he is now complaining of mouth sores. They are requesting medicine for this. Please advise

## 2020-01-14 NOTE — Telephone Encounter (Signed)
I'll send it once the pharmacy opens and I can get their fax number. Thanks!

## 2020-01-14 NOTE — Telephone Encounter (Signed)
Thank you! I looked all over for that number! You rock!

## 2020-02-12 ENCOUNTER — Ambulatory Visit: Payer: Medicare HMO | Admitting: Oncology

## 2020-02-12 ENCOUNTER — Other Ambulatory Visit: Payer: Self-pay | Admitting: *Deleted

## 2020-02-12 ENCOUNTER — Other Ambulatory Visit: Payer: Self-pay

## 2020-02-12 ENCOUNTER — Ambulatory Visit
Admission: RE | Admit: 2020-02-12 | Discharge: 2020-02-12 | Disposition: A | Discharge status: Home / Self Care | Payer: Medicare HMO | Source: Ambulatory Visit | Attending: Radiation Oncology | Admitting: Radiation Oncology

## 2020-02-12 ENCOUNTER — Encounter: Payer: Self-pay | Admitting: Radiation Oncology

## 2020-02-12 VITALS — BP 115/74 | HR 62 | Temp 97.4°F | Resp 16 | Wt 127.1 lb

## 2020-02-12 DIAGNOSIS — C760 Malignant neoplasm of head, face and neck: Secondary | ICD-10-CM

## 2020-02-12 DIAGNOSIS — Z9221 Personal history of antineoplastic chemotherapy: Secondary | ICD-10-CM | POA: Diagnosis not present

## 2020-02-12 DIAGNOSIS — C01 Malignant neoplasm of base of tongue: Secondary | ICD-10-CM | POA: Insufficient documentation

## 2020-02-12 DIAGNOSIS — Z923 Personal history of irradiation: Secondary | ICD-10-CM | POA: Diagnosis not present

## 2020-02-12 NOTE — Progress Notes (Signed)
Radiation Oncology Follow up Note  Name: Zachary Gilmore   Date:   02/12/2020 MRN:  [id number] DOB: 07-16-1956    This 64 y.o. male presents to the clinic today for 1 month follow-up status post concurrent chemoradiation therapy for.  Locally advanced presumed squamous cell carcinoma of the base of tongue.  REFERRING PROVIDER: Carlos American, FNP  HPI: Patient is a 64 year old male now out 1 month having completed concurrent chemoradiation therapy for 4.6 cm mass in the right neck consistent with metastatic squamous cell carcinoma.  We treated this as a possible base of tongue primary.  Seen today in routine follow-up he is doing fair from a head and neck standpoint he is swallowing well he is having no dysphagia or head and neck pain he just feels frail and in poor condition..  COMPLICATIONS OF TREATMENT: none  FOLLOW UP COMPLIANCE: keeps appointments   PHYSICAL EXAM:  BP 115/74 (BP Location: Left Arm, Patient Position: Sitting, Cuff Size: Normal)   Pulse 62   Temp (!) 97.4 F (36.3 C) (Tympanic)   Resp 16   Wt 127 lb 1.6 oz (57.7 kg)   BMI 18.77 kg/m  Neck is clear has had a complete response by palpation to his right neck.  Well-developed well-nourished patient in NAD. HEENT reveals PERLA, EOMI, discs not visualized.  Oral cavity is clear. No oral mucosal lesions are identified. Neck is clear without evidence of cervical or supraclavicular adenopathy. Lungs are clear to A&P. Cardiac examination is essentially unremarkable with regular rate and rhythm without murmur rub or thrill. Abdomen is benign with no organomegaly or masses noted. Motor sensory and DTR levels are equal and symmetric in the upper and lower extremities. Cranial nerves II through XII are grossly intact. Proprioception is intact. No peripheral adenopathy or edema is identified. No motor or sensory levels are noted. Crude visual fields are within normal range.  RADIOLOGY RESULTS: CT scan head and neck ordered for 3  months  PLAN: Present time patient is recovering nicely with a clinical complete response in the right neck.  I have set him up for follow-up with ENT of also asked the patient to make a follow-up appointment with Dr. Grayland Ormond.  I have ordered a CT scan about 3 to 4 months with a follow-up appointment shortly thereafter.  Patient knows to call with any concerns.  I would like to take this opportunity to thank you for allowing me to participate in the care of your patient.Noreene Filbert, MD

## 2020-02-24 NOTE — Progress Notes (Signed)
[place]  Telephone:(336) [phone] Fax:(336) [phone]  ID: Zachary Gilmore OB: [birth year]  MR#: [id number]  PPI#:[id number]  Patient Care Team: Carlos American, FNP as PCP - General (Family Medicine) Lloyd Huger, MD as Consulting Physician (Hematology and Oncology)  CHIEF COMPLAINT: Stage IVa squamous cell carcinoma of head and neck  INTERVAL HISTORY: Patient returns to clinic today for repeat laboratory work and further evaluation.  He completed his XRT approximately 3 weeks ago.  Patient reports his dysphagia has significantly improved along with his appetite.  He has chronic weakness and fatigue, but this is improving as well.  He has no neurologic complaints.  He denies any recent fevers or illnesses.  He has no chest pain, shortness of breath, cough, or hemoptysis.  He denies any nausea, vomiting, constipation, or diarrhea.  He has no urinary complaints.  Patient offers no further specific complaints today.  REVIEW OF SYSTEMS:   Review of Systems  Constitutional: Positive for malaise/fatigue. Negative for fever and weight loss.  HENT: Negative for sore throat.   Respiratory: Negative.  Negative for cough and shortness of breath.   Cardiovascular: Negative.  Negative for chest pain and leg swelling.  Gastrointestinal: Negative.  Negative for abdominal pain, blood in stool and melena.  Genitourinary: Negative.  Negative for dysuria.  Musculoskeletal: Negative.  Negative for back pain, joint pain and neck pain.  Skin: Negative.  Negative for rash.  Neurological: Positive for weakness. Negative for dizziness, focal weakness and headaches.  Psychiatric/Behavioral: Negative.  The patient is not nervous/anxious.     As per HPI. Otherwise, a complete review of systems is negative.  PAST MEDICAL HISTORY: Past Medical History:  Diagnosis Date  . Alcohol abuse   . Colorectal cancer ([place])   . GERD (gastroesophageal reflux disease)   . Hepatitis   .  History of substance abuse ([employer])   . Neuropathy   . Osteoarthritis   . Prostate cancer ([place])   . Seizures ([place])    as a child  . Shoulder pain   . Tobacco use     PAST SURGICAL HISTORY: Past Surgical History:  Procedure Laterality Date  . CARDIAC CATHETERIZATION N/A 10/03/2016   Procedure: Left Heart Cath and Coronary Angiography;  Surgeon: Corey Skains, MD;  Location: [facility];  Service: Cardiovascular;  Laterality: N/A;  . COLON SURGERY    . colorectal cancer     "scraped off"   . PROSTATE BIOPSY    . SHOULDER ARTHROSCOPY WITH BICEPS TENDON REPAIR Right 10/03/2016   Procedure: SHOULDER ARTHROSCOPIC LIMITED DEBRIDEMENT  WITH BICEPS TENOLYSIS;  Surgeon: Corky Mull, MD;  Location: [facility];  Service: Orthopedics;  Laterality: Right;  . Stomach ulcers      FAMILY HISTORY: Family History  Problem Relation Age of Onset  . Heart attack Mother   . Colon cancer Father   . Cancer Father        prostate  . Cancer Brother        prostate    ADVANCED DIRECTIVES (Y/N):  N  HEALTH MAINTENANCE: Social History   Tobacco Use  . Smoking status: Current Every Day Smoker    Packs/day: 0.50    Years: 45.00    Pack years: 22.50    Types: Cigarettes  . Smokeless tobacco: Never Used  Vaping Use  . Vaping Use: Never used  Substance Use Topics  . Alcohol use: Yes    Comment: occasional glass of wine  . Drug use: Yes  Types: Marijuana     Colonoscopy:  PAP:  Bone density:  Lipid panel:  No Known Allergies  Current Outpatient Medications  Medication Sig Dispense Refill  . acetaminophen (TYLENOL) 500 MG tablet Take 500 mg by mouth every 6 (six) hours as needed for mild pain.     . Cholecalciferol (D3-1000) 25 MCG (1000 UT) tablet Take 1,000 Units by mouth daily.    [name] docusate sodium (COLACE) 100 MG capsule Take 100 mg by mouth at bedtime.     . donepezil (ARICEPT) 10 MG tablet Take 10 mg by mouth at bedtime.     . gabapentin (NEURONTIN) 300 MG capsule Take  300 mg by mouth 3 (three) times daily.     . magic mouthwash SOLN Take 5-10 mLs by mouth 4 (four) times daily as needed for mouth pain. 480 mL 0  . mirtazapine (REMERON) 7.5 MG tablet Take 7.5 mg by mouth at bedtime.     . naltrexone (DEPADE) 50 MG tablet Take 50 mg by mouth daily.    . tamsulosin (FLOMAX) 0.4 MG CAPS capsule Take 0.4 mg by mouth daily after supper.      No current facility-administered medications for this visit.    OBJECTIVE: Vitals:   02/26/20 1529  BP: 116/74  Pulse: 66  Temp: (!) 96.7 F (35.9 C)  SpO2: 99%     Body mass index is 18.25 kg/m.    ECOG FS:0 - Asymptomatic  General: Well-developed, well-nourished, no acute distress. Eyes: Pink conjunctiva, anicteric sclera. HEENT: Normocephalic, moist mucous membranes.  Clear oropharynx without palpable lymphadenopathy. Lungs: No audible wheezing or coughing. Heart: Regular rate and rhythm. Abdomen: Soft, nontender, no obvious distention. Musculoskeletal: No edema, cyanosis, or clubbing. Neuro: Alert, answering all questions appropriately. Cranial nerves grossly intact. Skin: No rashes or petechiae noted. Psych: Normal affect.   LAB RESULTS:  Lab Results  Component Value Date   NA 136 02/27/2020   K 4.9 02/27/2020   CL 98 02/27/2020   CO2 29 02/27/2020   GLUCOSE 89 02/27/2020   BUN 8 02/27/2020   CREATININE 0.86 02/27/2020   CALCIUM 9.3 02/27/2020   PROT 6.7 12/19/2019   ALBUMIN 3.3 (L) 12/19/2019   AST 15 12/19/2019   ALT 11 12/19/2019   ALKPHOS 96 12/19/2019   BILITOT 0.5 12/19/2019   GFRNONAA >60 02/27/2020   GFRAA >60 02/27/2020    Lab Results  Component Value Date   WBC 6.4 02/27/2020   NEUTROABS 4.4 02/27/2020   HGB 13.7 02/27/2020   HCT 39.2 02/27/2020   MCV 94.5 02/27/2020   PLT 217 02/27/2020     STUDIES: No results found.  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  PLAN:    1.  Stage IVa squamous cell carcinoma of head and neck: Biopsy results  from October 08, 2019 consistent with squamous cell carcinoma.  PET imaging from February 2021 reviewed independently with large lymph node conglomerate on right neck, but no obvious source of primary.  Case was discussed with ENT and determined no further biopsies or surgeries are necessary as this would not alter patient's treatment moving forward.  Patient completed weekly cisplatin on January 02, 2020 and then completed daily XRT on January 08, 2020.  No further intervention is needed at this time.  We will repeat PET scan in approximately 2 months and patient will follow-up 1 to 2 days later to discuss the results.  2.  Shoulder pain: Patient does not complain of this today.  Monitor.  Continue Tylenol as needed. 3.  Dysphagia: Improving.   4. Thrombocytopenia: Resolved. 5.  Anemia: Resolved.  Patient expressed understanding and was in agreement with this plan. He also understands that He can call clinic at any time with any questions, concerns, or complaints.   Cancer Staging Squamous cell carcinoma of head and neck (HCC) Staging form: Cervical Lymph Nodes and Unknown Primary Tumors of the Head and Neck, AJCC 8th Edition - Clinical stage from 11/07/2019: Stage IVA (cT0, cN2a, cM0) - Signed by Lloyd Huger, MD on 11/07/2019   Lloyd Huger, MD   02/28/2020 10:36 AM

## 2020-02-26 ENCOUNTER — Encounter: Payer: Self-pay | Admitting: Oncology

## 2020-02-27 ENCOUNTER — Other Ambulatory Visit: Payer: Self-pay

## 2020-02-27 ENCOUNTER — Inpatient Hospital Stay: Payer: Medicare HMO | Attending: Oncology | Admitting: Oncology

## 2020-02-27 ENCOUNTER — Inpatient Hospital Stay: Payer: Medicare HMO

## 2020-02-27 VITALS — BP 116/74 | HR 66 | Temp 96.7°F | Wt 123.6 lb

## 2020-02-27 DIAGNOSIS — Z8249 Family history of ischemic heart disease and other diseases of the circulatory system: Secondary | ICD-10-CM | POA: Insufficient documentation

## 2020-02-27 DIAGNOSIS — Z800 Family history of malignant neoplasm of digestive organs: Secondary | ICD-10-CM | POA: Diagnosis not present

## 2020-02-27 DIAGNOSIS — R531 Weakness: Secondary | ICD-10-CM | POA: Insufficient documentation

## 2020-02-27 DIAGNOSIS — C760 Malignant neoplasm of head, face and neck: Secondary | ICD-10-CM | POA: Diagnosis present

## 2020-02-27 DIAGNOSIS — F17210 Nicotine dependence, cigarettes, uncomplicated: Secondary | ICD-10-CM | POA: Insufficient documentation

## 2020-02-27 DIAGNOSIS — R1310 Dysphagia, unspecified: Secondary | ICD-10-CM | POA: Insufficient documentation

## 2020-02-27 DIAGNOSIS — R5383 Other fatigue: Secondary | ICD-10-CM | POA: Insufficient documentation

## 2020-02-27 DIAGNOSIS — Z8042 Family history of malignant neoplasm of prostate: Secondary | ICD-10-CM | POA: Insufficient documentation

## 2020-02-27 DIAGNOSIS — M25519 Pain in unspecified shoulder: Secondary | ICD-10-CM | POA: Insufficient documentation

## 2020-02-27 DIAGNOSIS — Z8546 Personal history of malignant neoplasm of prostate: Secondary | ICD-10-CM | POA: Insufficient documentation

## 2020-02-27 DIAGNOSIS — Z79899 Other long term (current) drug therapy: Secondary | ICD-10-CM | POA: Diagnosis not present

## 2020-02-27 LAB — CBC WITH DIFFERENTIAL/PLATELET
Abs Immature Granulocytes: 0.02 10*3/uL (ref 0.00–0.07)
Basophils Absolute: 0 10*3/uL (ref 0.0–0.1)
Basophils Relative: 1 %
Eosinophils Absolute: 0.3 10*3/uL (ref 0.0–0.5)
Eosinophils Relative: 4 %
HCT: 39.2 % (ref 39.0–52.0)
Hemoglobin: 13.7 g/dL (ref 13.0–17.0)
Immature Granulocytes: 0 %
Lymphocytes Relative: 17 %
Lymphs Abs: 1.1 10*3/uL (ref 0.7–4.0)
MCH: 33 pg (ref 26.0–34.0)
MCHC: 34.9 g/dL (ref 30.0–36.0)
MCV: 94.5 fL (ref 80.0–100.0)
Monocytes Absolute: 0.6 10*3/uL (ref 0.1–1.0)
Monocytes Relative: 10 %
Neutro Abs: 4.4 10*3/uL (ref 1.7–7.7)
Neutrophils Relative %: 68 %
Platelets: 217 10*3/uL (ref 150–400)
RBC: 4.15 MIL/uL — ABNORMAL LOW (ref 4.22–5.81)
RDW: 16.7 % — ABNORMAL HIGH (ref 11.5–15.5)
WBC: 6.4 10*3/uL (ref 4.0–10.5)
nRBC: 0 % (ref 0.0–0.2)

## 2020-02-27 LAB — BASIC METABOLIC PANEL
Anion gap: 9 (ref 5–15)
BUN: 8 mg/dL (ref 8–23)
CO2: 29 mmol/L (ref 22–32)
Calcium: 9.3 mg/dL (ref 8.9–10.3)
Chloride: 98 mmol/L (ref 98–111)
Creatinine, Ser: 0.86 mg/dL (ref 0.61–1.24)
GFR calc Af Amer: >60 mL/min (ref >60)
GFR calc non Af Amer: >60 mL/min (ref >60)
Glucose, Bld: 89 mg/dL (ref 70–99)
Potassium: 4.9 mmol/L (ref 3.5–5.1)
Sodium: 136 mmol/L (ref 135–145)

## 2020-03-02 DIAGNOSIS — K219 Gastro-esophageal reflux disease without esophagitis: Secondary | ICD-10-CM | POA: Diagnosis not present

## 2020-03-02 DIAGNOSIS — B182 Chronic viral hepatitis C: Secondary | ICD-10-CM | POA: Diagnosis not present

## 2020-03-02 DIAGNOSIS — Z79899 Other long term (current) drug therapy: Secondary | ICD-10-CM | POA: Diagnosis not present

## 2020-03-02 DIAGNOSIS — F1010 Alcohol abuse, uncomplicated: Secondary | ICD-10-CM | POA: Diagnosis not present

## 2020-03-02 DIAGNOSIS — F1027 Alcohol dependence with alcohol-induced persisting dementia: Secondary | ICD-10-CM | POA: Diagnosis not present

## 2020-03-02 DIAGNOSIS — R972 Elevated prostate specific antigen [PSA]: Secondary | ICD-10-CM | POA: Diagnosis not present

## 2020-03-11 ENCOUNTER — Telehealth: Payer: Self-pay

## 2020-03-11 NOTE — Telephone Encounter (Signed)
Nutrition  Patient identified on Malnutrition Screening report for weight loss and poor appetite.    Called patient but no answer or option to leave voice mail.  Called caregiver Marcelo Baldy and no answer.  Mailbox is full so unable to leave voicemail.     Zachary Gilmore B. Zenia Resides, [place], [place] Registered Dietitian [phone] (pager)

## 2020-03-23 ENCOUNTER — Other Ambulatory Visit: Payer: Self-pay | Admitting: *Deleted

## 2020-03-23 ENCOUNTER — Telehealth: Payer: Self-pay | Admitting: *Deleted

## 2020-03-23 DIAGNOSIS — F1010 Alcohol abuse, uncomplicated: Secondary | ICD-10-CM

## 2020-03-23 DIAGNOSIS — C760 Malignant neoplasm of head, face and neck: Secondary | ICD-10-CM

## 2020-03-23 DIAGNOSIS — R531 Weakness: Secondary | ICD-10-CM

## 2020-03-23 NOTE — Telephone Encounter (Signed)
Patient should go to the ER.

## 2020-03-23 NOTE — Telephone Encounter (Signed)
Patient sis ter called with concerns over patient state and his complaints. He has completed radiation therapy and is staying in bed most of the time and states he is hurting all over. "He is a drinker and we won't let him have alcohol in my moms house where he stays and he is buying a lot of rubbing alcohol stating he is itching, but my brother and I think he is drinking it" He is complaining of feeling sick at stomach too. She says he does not look good. Please advise

## 2020-03-23 NOTE — Telephone Encounter (Signed)
Spoke to [place], who said she hasn't actually seen the patient today, but she thinks he is an alcoholic and they are not letting him buy any alcohol, so she is assuming that he is drinking rubbing alcohol. Told her that I call patient and speak directly to him.

## 2020-03-23 NOTE — Telephone Encounter (Signed)
Spoke to [place], patient's mother via telephone. Patient was with her at the time of the call. She stated that she could not get patient here to clinic tomorrow because of transportation issues. She stated that Thursday would be much better for them. I asked her if she thought he would be able to wait until Thursday, and she said yes. Patient also agreed that he could wait until Thursday. Instructed them to call EMS immediately if patient declined or became acutely ill. They verbalized understanding. Appointment changed to Thursday at 10:00 am.

## 2020-03-23 NOTE — Telephone Encounter (Signed)
Spoke to patient via telephone and he denies drinking any alcohol in any form. Denies rash, itchy skin. Admits to poor appetite and weakness, and trouble swallowing, but stated "I'm drinking plenty of fluids". Asked patient if he thought he needed to go to the ED and he said no. Offered patient an appointment in the symptom management clinic tomorrow for labs, evaluation and possible IV fluids. He accepted appointment for 10:00 am tomorrow.

## 2020-03-23 NOTE — Telephone Encounter (Signed)
Call returned to sister, I got her voice mail and left a non distinguishing message for her to take him to ER

## 2020-03-24 ENCOUNTER — Other Ambulatory Visit: Payer: Self-pay | Admitting: *Deleted

## 2020-03-24 ENCOUNTER — Other Ambulatory Visit: Payer: Medicare HMO

## 2020-03-24 DIAGNOSIS — F1010 Alcohol abuse, uncomplicated: Secondary | ICD-10-CM

## 2020-03-25 ENCOUNTER — Ambulatory Visit: Payer: Medicare HMO

## 2020-03-25 ENCOUNTER — Inpatient Hospital Stay: Payer: Medicare HMO

## 2020-03-25 ENCOUNTER — Inpatient Hospital Stay (HOSPITAL_BASED_OUTPATIENT_CLINIC_OR_DEPARTMENT_OTHER): Payer: Medicare HMO | Admitting: Hospice and Palliative Medicine

## 2020-03-25 ENCOUNTER — Inpatient Hospital Stay: Payer: Medicare HMO | Attending: Oncology

## 2020-03-25 ENCOUNTER — Other Ambulatory Visit: Payer: Self-pay

## 2020-03-25 VITALS — BP 138/86 | HR 78 | Temp 96.1°F | Resp 16 | Wt 120.0 lb

## 2020-03-25 DIAGNOSIS — M1990 Unspecified osteoarthritis, unspecified site: Secondary | ICD-10-CM | POA: Diagnosis not present

## 2020-03-25 DIAGNOSIS — R531 Weakness: Secondary | ICD-10-CM

## 2020-03-25 DIAGNOSIS — K219 Gastro-esophageal reflux disease without esophagitis: Secondary | ICD-10-CM | POA: Insufficient documentation

## 2020-03-25 DIAGNOSIS — M25561 Pain in right knee: Secondary | ICD-10-CM | POA: Insufficient documentation

## 2020-03-25 DIAGNOSIS — R110 Nausea: Secondary | ICD-10-CM | POA: Diagnosis not present

## 2020-03-25 DIAGNOSIS — E860 Dehydration: Secondary | ICD-10-CM

## 2020-03-25 DIAGNOSIS — Z923 Personal history of irradiation: Secondary | ICD-10-CM | POA: Insufficient documentation

## 2020-03-25 DIAGNOSIS — C760 Malignant neoplasm of head, face and neck: Secondary | ICD-10-CM

## 2020-03-25 DIAGNOSIS — F1010 Alcohol abuse, uncomplicated: Secondary | ICD-10-CM

## 2020-03-25 DIAGNOSIS — Z79899 Other long term (current) drug therapy: Secondary | ICD-10-CM | POA: Diagnosis not present

## 2020-03-25 DIAGNOSIS — E871 Hypo-osmolality and hyponatremia: Secondary | ICD-10-CM | POA: Diagnosis not present

## 2020-03-25 DIAGNOSIS — Z8546 Personal history of malignant neoplasm of prostate: Secondary | ICD-10-CM | POA: Diagnosis not present

## 2020-03-25 DIAGNOSIS — I951 Orthostatic hypotension: Secondary | ICD-10-CM

## 2020-03-25 LAB — COMPREHENSIVE METABOLIC PANEL
ALT: 12 U/L (ref 0–44)
AST: 18 U/L (ref 15–41)
Albumin: 4 g/dL (ref 3.5–5.0)
Alkaline Phosphatase: 74 U/L (ref 38–126)
Anion gap: 8 (ref 5–15)
BUN: 10 mg/dL (ref 8–23)
CO2: 30 mmol/L (ref 22–32)
Calcium: 9.1 mg/dL (ref 8.9–10.3)
Chloride: 95 mmol/L — ABNORMAL LOW (ref 98–111)
Creatinine, Ser: 1.06 mg/dL (ref 0.61–1.24)
GFR calc Af Amer: >60 mL/min (ref >60)
GFR calc non Af Amer: >60 mL/min (ref >60)
Glucose, Bld: 115 mg/dL — ABNORMAL HIGH (ref 70–99)
Potassium: 4.4 mmol/L (ref 3.5–5.1)
Sodium: 133 mmol/L — ABNORMAL LOW (ref 135–145)
Total Bilirubin: 0.6 mg/dL (ref 0.3–1.2)
Total Protein: 6.9 g/dL (ref 6.5–8.1)

## 2020-03-25 LAB — CBC WITH DIFFERENTIAL/PLATELET
Abs Immature Granulocytes: 0.02 10*3/uL (ref 0.00–0.07)
Basophils Absolute: 0 10*3/uL (ref 0.0–0.1)
Basophils Relative: 1 %
Eosinophils Absolute: 0.2 10*3/uL (ref 0.0–0.5)
Eosinophils Relative: 2 %
HCT: 37.9 % — ABNORMAL LOW (ref 39.0–52.0)
Hemoglobin: 13.7 g/dL (ref 13.0–17.0)
Immature Granulocytes: 0 %
Lymphocytes Relative: 14 %
Lymphs Abs: 0.9 10*3/uL (ref 0.7–4.0)
MCH: 32.7 pg (ref 26.0–34.0)
MCHC: 36.1 g/dL — ABNORMAL HIGH (ref 30.0–36.0)
MCV: 90.5 fL (ref 80.0–100.0)
Monocytes Absolute: 0.5 10*3/uL (ref 0.1–1.0)
Monocytes Relative: 8 %
Neutro Abs: 5 10*3/uL (ref 1.7–7.7)
Neutrophils Relative %: 75 %
Platelets: 206 10*3/uL (ref 150–400)
RBC: 4.19 MIL/uL — ABNORMAL LOW (ref 4.22–5.81)
RDW: 12.1 % (ref 11.5–15.5)
WBC: 6.6 10*3/uL (ref 4.0–10.5)
nRBC: 0 % (ref 0.0–0.2)

## 2020-03-25 LAB — MAGNESIUM: Magnesium: 1.6 mg/dL — ABNORMAL LOW (ref 1.7–2.4)

## 2020-03-25 MED ORDER — DEXAMETHASONE SODIUM PHOSPHATE 10 MG/ML IJ SOLN
4.0000 mg | Freq: Once | INTRAMUSCULAR | Status: AC
  Administered 2020-03-25: 4 mg via INTRAVENOUS
  Filled 2020-03-25: qty 1

## 2020-03-25 MED ORDER — SODIUM CHLORIDE 0.9 % IV SOLN
Freq: Once | INTRAVENOUS | Status: AC
  Filled 2020-03-25: qty 250

## 2020-03-25 NOTE — Progress Notes (Signed)
Nutrition Assessment   Reason for Assessment:  Add on today from [place], NP weight loss, poor appetite, head and neck cancer.   ASSESSMENT:  64 year old male with stage IV squamous cell carcinoma of base of tongue.  Past medical history of etoh use, colorectal cancer, substance abuse, GERD, hepatitis, prostate cancer, seizures as child.  Patient completed chemotherapy on April 23 and radiation on April 29th.    Met with patient in [facility] while getting fluids.  Patient reports that he has no appetite.  Reports lack of taste but coming back.  Reports that yesterday was able to eat 1 egg with 3 strips of bacon and eat a [employer] cheeseburger and milkshake.  Drank water and Mt Dew.  Reports that he likes boost and ensure.  Lives with Marcelo Baldy, caregiver.  She prepares meals for him or takes him to get food.  Denies trouble swallowing or pain on swallowing.    Medications: colace, aricept, remeron,    Labs: reviewed   Anthropometrics:   Height: 69 inches Weight: 120 lb today UBW: 127 lb on 6/3 135 lb on 3/26 BMI: 18 11% weight loss in the last 4 months, significant  Estimated Energy Needs  Kcals: 1600-1800 Protein: 80-90 g Fluid: > 1.6 L   NUTRITION DIAGNOSIS: Inadequate oral intake related to cancer related treatment side effects as evidenced by 11% weight loss in the last 4 months and poor appetite   INTERVENTION:  Discussed ways to add calories and protein in diet. Soft moist protein foods discussed and handout provided.  Encouraged small frequent meals Recommend drinking 4 ensure enlive daily for added calories and protein.  Patient says he has been drinking 3-4 daily.  Complimentary case given to patient today Contact information given to patient    MONITORING, EVALUATION, GOAL: weight trends, intake   Next Visit: August 5 phone f/u    Kendyn Zaman B. Zenia Resides, [place], [place] Registered Dietitian [phone] (pager)

## 2020-03-25 NOTE — Progress Notes (Signed)
[facility]pt[facility] [place]  [phone] [phone]  Patient Care Team: Carlos American, FNP as PCP - General (Family Medicine) Lloyd Huger, MD as Consulting Physician (Hematology and Oncology)   Name of the patient: Zachary Gilmore  [id number]  1955-12-31   Date of visit: 03/25/20  Reason for Consult: Mr. 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  PMH also notable for alcohol abuse, history of colorectal and prostate cancers, hepatitis, and history of seizures.  Patient presents to the [facility] today for evaluation of weakness.    Patient reports that he has not been eating or drinking much over the last several weeks since he completed radiation treatments.  Patient says he is not having much taste but feels like it is now starting to return.  He does endorse occasional nausea but denies vomiting.  He was drinking an Ensure or two each day but has been out of those recently.   Says he has felt generally weak but has had no change in overall performance status.  He says he is functionally independent with his care at home.  He denies any recent falls.  There was some concern by family that patient was drinking.  However, patient adamantly denies this.  He says that he last had a beer a few months ago.  Patient does endorse right shoulder and right knee pain from old musculoskeletal injuries but says that they are unchanged in characteristic.  Denies any neurologic complaints. Denies recent fevers or illnesses. Denies any easy bleeding or bruising. Denies chest pain. Denies any  vomiting, constipation, or diarrhea. Denies urinary complaints. Patient offers no further specific complaints today.  Social -patient used to reside in an ALF but it was closed.  He is now living with a  friend's mother.  APS with [facility] has been previously involved due to concerns that there was a bedbug infestation in the home but these concerns ultimately were unfounded.   PAST MEDICAL HISTORY: Past Medical History:  Diagnosis Date  . Alcohol abuse   . Colorectal cancer ([place])   . GERD (gastroesophageal reflux disease)   . Hepatitis   . History of substance abuse ([place])   . Neuropathy   . Osteoarthritis   . Prostate cancer ([place])   . Seizures ([place])    as a child  . Shoulder pain   . Tobacco use     PAST SURGICAL HISTORY:  Past Surgical History:  Procedure Laterality Date  . CARDIAC CATHETERIZATION N/A 10/03/2016   Procedure: Left Heart Cath and Coronary Angiography;  Surgeon: Corey Skains, MD;  Location: [place] [facility];  Service: Cardiovascular;  Laterality: N/A;  . COLON SURGERY    . colorectal cancer     "scraped off"   . PROSTATE BIOPSY    . SHOULDER ARTHROSCOPY WITH BICEPS TENDON REPAIR Right 10/03/2016   Procedure: SHOULDER ARTHROSCOPIC LIMITED DEBRIDEMENT  WITH BICEPS TENOLYSIS;  Surgeon: Corky Mull, MD;  Location: [facility] ORS;  Service: Orthopedics;  Laterality: Right;  . Stomach ulcers      HEMATOLOGY/ONCOLOGY HISTORY:  Oncology History  Squamous cell carcinoma of head and neck ([place])  10/04/2019 Initial Diagnosis   Squamous cell carcinoma of head and neck ([place])   11/07/2019 Cancer Staging   Staging form: Cervical Lymph Nodes and Unknown Primary Tumors of the Head  and Neck, AJCC 8th Edition - Clinical stage from 11/07/2019: Stage IVA (cT0, cN2a, cM0) - Signed by Lloyd Huger, MD on 11/07/2019   11/20/2019 -  Chemotherapy   The patient had palonosetron (ALOXI) injection 0.25 mg, 0.25 mg, Intravenous,  Once, 7 of 7 cycles Administration: 0.25 mg (11/20/2019), 0.25 mg (11/28/2019), 0.25 mg (12/05/2019), 0.25 mg (12/12/2019), 0.25 mg (12/19/2019), 0.25 mg (12/26/2019), 0.25 mg (01/02/2020) CISplatin (PLATINOL) 70 mg in sodium chloride 0.9 % 250 mL chemo  infusion, 40 mg/m2 = 70 mg, Intravenous,  Once, 7 of 7 cycles Administration: 70 mg (11/20/2019), 70 mg (11/28/2019), 70 mg (12/05/2019), 70 mg (12/12/2019), 70 mg (12/19/2019), 70 mg (12/26/2019), 70 mg (01/02/2020) fosaprepitant (EMEND) 150 mg in sodium chloride 0.9 % 145 mL IVPB, 150 mg, Intravenous,  Once, 7 of 7 cycles Administration: 150 mg (11/20/2019), 150 mg (11/28/2019), 150 mg (12/05/2019), 150 mg (12/12/2019), 150 mg (12/19/2019), 150 mg (12/26/2019), 150 mg (01/02/2020)  for chemotherapy treatment.      ALLERGIES:  has No Known Allergies.  MEDICATIONS:  Current Outpatient Medications  Medication Sig Dispense Refill  . acetaminophen (TYLENOL) 500 MG tablet Take 500 mg by mouth every 6 (six) hours as needed for mild pain.     . Cholecalciferol (D3-1000) 25 MCG (1000 UT) tablet Take 1,000 Units by mouth daily.    [name] docusate sodium (COLACE) 100 MG capsule Take 100 mg by mouth at bedtime.     . donepezil (ARICEPT) 10 MG tablet Take 10 mg by mouth at bedtime.     . gabapentin (NEURONTIN) 300 MG capsule Take 300 mg by mouth 3 (three) times daily.     . magic mouthwash SOLN Take 5-10 mLs by mouth 4 (four) times daily as needed for mouth pain. 480 mL 0  . mirtazapine (REMERON) 7.5 MG tablet Take 7.5 mg by mouth at bedtime.     . naltrexone (DEPADE) 50 MG tablet Take 50 mg by mouth daily.    . tamsulosin (FLOMAX) 0.4 MG CAPS capsule Take 0.4 mg by mouth daily after supper.      No current facility-administered medications for this visit.    VITAL SIGNS: There were no vitals taken for this visit. There were no vitals filed for this visit.  Estimated body mass index is 18.25 kg/m as calculated from the following:   Height as of 10/08/19: 5\' 9"  (1.753 m).   Weight as of 02/26/20: 123 lb 9.6 oz (56.1 kg).  LABS: CBC:    Component Value Date/Time   WBC 6.4 02/27/2020 1142   HGB 13.7 02/27/2020 1142   HGB 16.8 05/25/2016 1347   HCT 39.2 02/27/2020 1142   HCT 50.5 05/25/2016 1347   PLT 217  02/27/2020 1142   PLT 183 05/25/2016 1347   MCV 94.5 02/27/2020 1142   MCV 94 05/25/2016 1347   NEUTROABS 4.4 02/27/2020 1142   NEUTROABS 5.0 05/25/2016 1347   LYMPHSABS 1.1 02/27/2020 1142   LYMPHSABS 2.1 05/25/2016 1347   MONOABS 0.6 02/27/2020 1142   EOSABS 0.3 02/27/2020 1142   EOSABS 0.2 05/25/2016 1347   BASOSABS 0.0 02/27/2020 1142   BASOSABS 0.1 05/25/2016 1347   Comprehensive Metabolic Panel:    Component Value Date/Time   NA 136 02/27/2020 1142   K 4.9 02/27/2020 1142   CL 98 02/27/2020 1142   CO2 29 02/27/2020 1142   BUN 8 02/27/2020 1142   CREATININE 0.86 02/27/2020 1142   GLUCOSE 89 02/27/2020 1142   CALCIUM 9.3 02/27/2020 1142  AST 15 12/19/2019 0813   ALT 11 12/19/2019 0813   ALKPHOS 96 12/19/2019 0813   BILITOT 0.5 12/19/2019 0813   BILITOT 0.5 05/25/2016 1347   PROT 6.7 12/19/2019 0813   PROT 7.1 05/25/2016 1347   ALBUMIN 3.3 (L) 12/19/2019 0813   ALBUMIN 4.2 05/25/2016 1347    RADIOGRAPHIC STUDIES: No results found.  PERFORMANCE STATUS (ECOG) : 2 - Symptomatic, <50% confined to bed  Review of Systems Unless otherwise noted, a complete review of systems is negative.  Physical Exam General: NAD, thin, frail appearing Cardiovascular: regular rate and rhythm Pulmonary: clear ant fields Abdomen: soft, nontender, + bowel sounds GU: no suprapubic tenderness Extremities: no edema, no joint deformities Skin: no rashes Neurological: Weakness but otherwise nonfocal  Assessment and Plan- Patient is a 64 y.o. male with multiple medical problems including stage IV head neck cancer status post XRT/chemotherapy, history of alcohol and tobacco abuse, who presents to the clinic today for evaluation of weakness.   Weakness - likely residual from treatment and exacerbated by poor oral intake. Offered to order home health PT/OT but patient refused. Offered home PC but he also refused this. Patient has both Medicare/Medicaid and could qualify for increased  resources in the home if he would allow. He might be a reasonable candidate for the PACE program.   Dehydration - patient was orthostatic today.  He has borderline hyponatremia likely secondary to dehydration. Will give NS 1L x 1. Will give dexamethasone 4mg  IV x 1 to help with fatigue/appetite/pain. Encouraged PO intake. Referral to nutrition today and spoke with Desmond Lope, RD who will also see him in the clinic.   Pain -musculoskeletal.  May take OTC acetaminophen sparingly.  RTC as scheduled to see Dr. Grayland Ormond in 1 month or sooner if needed  Patient expressed understanding and was in agreement with this plan. He also understands that He can call clinic at any time with any questions, concerns, or complaints.   Thank you for allowing me to participate in the care of this very pleasant patient.   Time Total: 25 minutes  Visit consisted of counseling and education dealing with the complex and emotionally intense issues of symptom management and palliative care in the setting of serious and potentially life-threatening illness.Greater than 50%  of this time was spent counseling and coordinating care related to the above assessment and plan.  Signed by: Altha Harm, PhD, NP-C

## 2020-03-26 LAB — VOLATILES,BLD-ACETONE,ETHANOL,ISOPROP,METHANOL
Acetone, blood: <0.01 g/dL (ref 0.000–0.010)
Ethanol, blood: <0.01 g/dL (ref 0.000–0.010)
Isopropanol, blood: <0.01 g/dL (ref 0.000–0.010)
Methanol, blood: <0.01 g/dL (ref 0.000–0.010)

## 2020-03-30 ENCOUNTER — Telehealth: Payer: Self-pay | Admitting: *Deleted

## 2020-03-30 NOTE — Telephone Encounter (Signed)
Sister called wanting return call to let her know how patient is doing stating he did not come home with any papers. She wants to know if his treatment is working or not and how how is doing in general. Please return her call [phone]

## 2020-03-31 NOTE — Telephone Encounter (Signed)
I spoke with family. She had questions about plan for further workup. Discussed pending PET scan next month and suggested that family might want to come to visit to review results with Dr. Grayland Ormond on 8/19. All questions answered.

## 2020-04-15 ENCOUNTER — Inpatient Hospital Stay: Payer: Medicare HMO | Attending: Oncology

## 2020-04-15 DIAGNOSIS — C760 Malignant neoplasm of head, face and neck: Secondary | ICD-10-CM | POA: Insufficient documentation

## 2020-04-15 NOTE — Progress Notes (Signed)
Nutrition  Called Marcelo Baldy per patient request for nutrition follow-up.  No answer or option to leave voicemail as mailbox full.    Carzell Saldivar B. Zenia Resides, [place], [place] Registered Dietitian [phone] (mobile)

## 2020-04-23 NOTE — Progress Notes (Deleted)
[place]  [phone] [phone]) [phone]  ID: Zachary Gilmore OB: [birth year]  MR#: [id number]  IDC#:[id number]  Patient Care Team: Zachary American, FNP as PCP - General (Family Medicine) Lloyd Huger, MD as Consulting Physician (Hematology and Oncology) Lloyd Huger, MD as Consulting Physician (Oncology)  CHIEF COMPLAINT: Stage IVa squamous cell carcinoma of head and neck  INTERVAL HISTORY: Patient returns to clinic today for repeat laboratory work and further evaluation.  He completed his XRT approximately 3 weeks ago.  Patient reports his dysphagia has significantly improved along with his appetite.  He has chronic weakness and fatigue, but this is improving as well.  He has no neurologic complaints.  He denies any recent fevers or illnesses.  He has no chest pain, shortness of breath, cough, or hemoptysis.  He denies any nausea, vomiting, constipation, or diarrhea.  He has no urinary complaints.  Patient offers no further specific complaints today.  REVIEW OF SYSTEMS:   Review of Systems  Constitutional: Positive for malaise/fatigue. Negative for fever and weight loss.  HENT: Negative for sore throat.   Respiratory: Negative.  Negative for cough and shortness of breath.   Cardiovascular: Negative.  Negative for chest pain and leg swelling.  Gastrointestinal: Negative.  Negative for abdominal pain, blood in stool and melena.  Genitourinary: Negative.  Negative for dysuria.  Musculoskeletal: Negative.  Negative for back pain, joint pain and neck pain.  Skin: Negative.  Negative for rash.  Neurological: Positive for weakness. Negative for dizziness, focal weakness and headaches.  Psychiatric/Behavioral: Negative.  The patient is not nervous/anxious.     As per HPI. Otherwise, a complete review of systems is negative.  PAST MEDICAL HISTORY: Past Medical History:  Diagnosis Date  . Alcohol abuse   . Colorectal cancer ([place])   . GERD  (gastroesophageal reflux disease)   . Hepatitis   . History of substance abuse ([place])   . Neuropathy   . Osteoarthritis   . Prostate cancer ([place])   . Seizures ([place])    as a child  . Shoulder pain   . Tobacco use     PAST SURGICAL HISTORY: Past Surgical History:  Procedure Laterality Date  . CARDIAC CATHETERIZATION N/A 10/03/2016   Procedure: Left Heart Cath and Coronary Angiography;  Surgeon: Corey Skains, MD;  Location: [place] [facility];  Service: Cardiovascular;  Laterality: N/A;  . COLON SURGERY    . colorectal cancer     "scraped off"   . PROSTATE BIOPSY    . SHOULDER ARTHROSCOPY WITH BICEPS TENDON REPAIR Right 10/03/2016   Procedure: SHOULDER ARTHROSCOPIC LIMITED DEBRIDEMENT  WITH BICEPS TENOLYSIS;  Surgeon: Corky Mull, MD;  Location: [facility] ORS;  Service: Orthopedics;  Laterality: Right;  . Stomach ulcers      FAMILY HISTORY: Family History  Problem Relation Age of Onset  . Heart attack Mother   . Colon cancer Father   . Cancer Father        prostate  . Cancer Brother        prostate    ADVANCED DIRECTIVES (Y/N):  N  HEALTH MAINTENANCE: Social History   Tobacco Use  . Smoking status: Current Every Day Smoker    Packs/day: 0.50    Years: 45.00    Pack years: 22.50    Types: Cigarettes  . Smokeless tobacco: Never Used  Vaping Use  . Vaping Use: Never used  Substance Use Topics  . Alcohol use: Yes    Comment: occasional glass  of wine  . Drug use: Yes    Types: Marijuana     Colonoscopy:  PAP:  Bone density:  Lipid panel:  No Known Allergies  Current Outpatient Medications  Medication Sig Dispense Refill  . acetaminophen (TYLENOL) 500 MG tablet Take 500 mg by mouth every 6 (six) hours as needed for mild pain.  (Patient not taking: Reported on 03/25/2020)    . Cholecalciferol (D3-1000) 25 MCG (1000 UT) tablet Take 1,000 Units by mouth daily.    [name] docusate sodium (COLACE) 100 MG capsule Take 100 mg by mouth at bedtime.     . donepezil  (ARICEPT) 10 MG tablet Take 10 mg by mouth at bedtime.     . gabapentin (NEURONTIN) 300 MG capsule Take 300 mg by mouth 3 (three) times daily.     . magic mouthwash SOLN Take 5-10 mLs by mouth 4 (four) times daily as needed for mouth pain. (Patient not taking: Reported on 03/25/2020) 480 mL 0  . mirtazapine (REMERON) 7.5 MG tablet Take 7.5 mg by mouth at bedtime.     . naltrexone (DEPADE) 50 MG tablet Take 50 mg by mouth daily.    . tamsulosin (FLOMAX) 0.4 MG CAPS capsule Take 0.4 mg by mouth daily after supper.      No current facility-administered medications for this visit.    OBJECTIVE: There were no vitals filed for this visit.   There is no height or weight on file to calculate BMI.    ECOG FS:0 - Asymptomatic  General: Well-developed, well-nourished, no acute distress. Eyes: Pink conjunctiva, anicteric sclera. HEENT: Normocephalic, moist mucous membranes.  Clear oropharynx without palpable lymphadenopathy. Lungs: No audible wheezing or coughing. Heart: Regular rate and rhythm. Abdomen: Soft, nontender, no obvious distention. Musculoskeletal: No edema, cyanosis, or clubbing. Neuro: Alert, answering all questions appropriately. Cranial nerves grossly intact. Skin: No rashes or petechiae noted. Psych: Normal affect.   LAB RESULTS:  Lab Results  Component Value Date   NA 133 (L) 03/25/2020   K 4.4 03/25/2020   CL 95 (L) 03/25/2020   CO2 30 03/25/2020   GLUCOSE 115 (H) 03/25/2020   BUN 10 03/25/2020   CREATININE 1.06 03/25/2020   CALCIUM 9.1 03/25/2020   PROT 6.9 03/25/2020   ALBUMIN 4.0 03/25/2020   AST 18 03/25/2020   ALT 12 03/25/2020   ALKPHOS 74 03/25/2020   BILITOT 0.6 03/25/2020   GFRNONAA >60 03/25/2020   GFRAA >60 03/25/2020    Lab Results  Component Value Date   WBC 6.6 03/25/2020   NEUTROABS 5.0 03/25/2020   HGB 13.7 03/25/2020   HCT 37.9 (L) 03/25/2020   MCV 90.5 03/25/2020   PLT 206 03/25/2020     STUDIES: No results found.  ASSESSMENT: Stage  IVa squamous cell carcinoma of head and neck.  PLAN:    1.  Stage IVa squamous cell carcinoma of head and neck: Biopsy results from October 08, 2019 consistent with squamous cell carcinoma.  PET imaging from February 2021 reviewed independently with large lymph node conglomerate on right neck, but no obvious source of primary.  Case was discussed with ENT and determined no further biopsies or surgeries are necessary as this would not alter patient's treatment moving forward.  Patient completed weekly cisplatin on January 02, 2020 and then completed daily XRT on January 08, 2020.  No further intervention is needed at this time.  We will repeat PET scan in approximately 2 months and patient will follow-up 1 to 2 days later to discuss  the results.  2.  Shoulder pain: Patient does not complain of this today.  Monitor.  Continue Tylenol as needed. 3.  Dysphagia: Improving.   4. Thrombocytopenia: Resolved. 5.  Anemia: Resolved.  Patient expressed understanding and was in agreement with this plan. He also understands that He can call clinic at any time with any questions, concerns, or complaints.   Cancer Staging Squamous cell carcinoma of head and neck (HCC) Staging form: Cervical Lymph Nodes and Unknown Primary Tumors of the Head and Neck, AJCC 8th Edition - Clinical stage from 11/07/2019: Stage IVA (cT0, cN2a, cM0) - Signed by Lloyd Huger, MD on 11/07/2019   Lloyd Huger, MD   04/23/2020 8:25 AM

## 2020-04-26 ENCOUNTER — Other Ambulatory Visit: Payer: Self-pay

## 2020-04-26 ENCOUNTER — Encounter
Admission: RE | Admit: 2020-04-26 | Discharge: 2020-04-26 | Disposition: A | Discharge status: Home / Self Care | Payer: Medicare HMO | Source: Ambulatory Visit | Attending: Oncology | Admitting: Oncology

## 2020-04-26 DIAGNOSIS — C760 Malignant neoplasm of head, face and neck: Secondary | ICD-10-CM

## 2020-04-28 ENCOUNTER — Telehealth: Payer: Self-pay | Admitting: Oncology

## 2020-04-28 NOTE — Telephone Encounter (Signed)
Patient's friend, Loma Boston, phoned on this date and asked to move 05-07-20 appt to another date due to transportation. Appt moved to 05-10-20.

## 2020-04-29 ENCOUNTER — Inpatient Hospital Stay: Payer: Medicare HMO

## 2020-04-29 ENCOUNTER — Telehealth: Payer: Self-pay

## 2020-04-29 ENCOUNTER — Telehealth: Payer: Self-pay | Admitting: *Deleted

## 2020-04-29 ENCOUNTER — Inpatient Hospital Stay: Payer: Medicare HMO | Admitting: Oncology

## 2020-04-29 NOTE — Telephone Encounter (Signed)
Called Patient's caretaker to advise her regarding his constipation. She was unsure about the date of his last bowel movement and stated that she was awaiting a call back from his PCP regarding problem. Writer advised her to continue Colace and add prune juice to his diet today.  Instructed her to call clinic back if she did not hear from PCP.

## 2020-04-29 NOTE — Telephone Encounter (Signed)
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  Patient completed chemotherapy on April 23 and radiation on April 29th.    Spoke with Zachary Gilmore, caregiver of patient for nutrition follow-up.  Zachary Gilmore reports that patient is having trouble swallowing.  Patient does eat soups, pudding, cheeseburger from [employer], ensure.  Zachary Gilmore reports that she has all kinds of foods available for him but not eating much.  Zachary Gilmore reports that patient is constipated, says it has been "awhile" since patient has had a bowel movement.    Planning PET on 8/25.  Medications: colace BID  Labs: reviewed  Anthropometrics:   No new recent weight  NUTRITION DIAGNOSIS: Inadequate oral intake continues    INTERVENTION:  Message sent to Dr. Gary Gilmore team regarding constipation and Zachary Gilmore asking what else she can give him. Encouraged soft, smooth, creamy foods for patient.  Discussed options to help with swallowing. Patient may benefit from evaluation by SLP Encouraged oral nutrition supplements Contact information provided to [name]   NEXT VISIT: as needed  Zachary Gilmore B. Zachary Gilmore, Zachary Gilmore, Zachary Gilmore Registered Dietitian [phone] (mobile)

## 2020-05-05 ENCOUNTER — Inpatient Hospital Stay: Payer: Medicare HMO

## 2020-05-05 ENCOUNTER — Encounter
Admission: RE | Admit: 2020-05-05 | Discharge: 2020-05-05 | Disposition: A | Discharge status: Home / Self Care | Payer: Medicare HMO | Source: Ambulatory Visit | Attending: Oncology | Admitting: Oncology

## 2020-05-05 ENCOUNTER — Other Ambulatory Visit: Payer: Self-pay

## 2020-05-05 DIAGNOSIS — C760 Malignant neoplasm of head, face and neck: Secondary | ICD-10-CM | POA: Insufficient documentation

## 2020-05-05 DIAGNOSIS — J439 Emphysema, unspecified: Secondary | ICD-10-CM | POA: Diagnosis not present

## 2020-05-05 DIAGNOSIS — I700 Atherosclerosis of aorta: Secondary | ICD-10-CM | POA: Diagnosis not present

## 2020-05-05 DIAGNOSIS — R609 Edema, unspecified: Secondary | ICD-10-CM | POA: Insufficient documentation

## 2020-05-05 LAB — BASIC METABOLIC PANEL
Anion gap: 7 (ref 5–15)
BUN: 7 mg/dL — ABNORMAL LOW (ref 8–23)
CO2: 31 mmol/L (ref 22–32)
Calcium: 9.1 mg/dL (ref 8.9–10.3)
Chloride: 98 mmol/L (ref 98–111)
Creatinine, Ser: 1.16 mg/dL (ref 0.61–1.24)
GFR calc Af Amer: >60 mL/min (ref >60)
GFR calc non Af Amer: >60 mL/min (ref >60)
Glucose, Bld: 104 mg/dL — ABNORMAL HIGH (ref 70–99)
Potassium: 4.7 mmol/L (ref 3.5–5.1)
Sodium: 136 mmol/L (ref 135–145)

## 2020-05-05 LAB — CBC WITH DIFFERENTIAL/PLATELET
Abs Immature Granulocytes: 0.02 10*3/uL (ref 0.00–0.07)
Basophils Absolute: 0 10*3/uL (ref 0.0–0.1)
Basophils Relative: 1 %
Eosinophils Absolute: 0.1 10*3/uL (ref 0.0–0.5)
Eosinophils Relative: 2 %
HCT: 39.1 % (ref 39.0–52.0)
Hemoglobin: 13.9 g/dL (ref 13.0–17.0)
Immature Granulocytes: 0 %
Lymphocytes Relative: 12 %
Lymphs Abs: 0.9 10*3/uL (ref 0.7–4.0)
MCH: 32 pg (ref 26.0–34.0)
MCHC: 35.5 g/dL (ref 30.0–36.0)
MCV: 89.9 fL (ref 80.0–100.0)
Monocytes Absolute: 0.5 10*3/uL (ref 0.1–1.0)
Monocytes Relative: 7 %
Neutro Abs: 6 10*3/uL (ref 1.7–7.7)
Neutrophils Relative %: 78 %
Platelets: 212 10*3/uL (ref 150–400)
RBC: 4.35 MIL/uL (ref 4.22–5.81)
RDW: 11.6 % (ref 11.5–15.5)
WBC: 7.6 10*3/uL (ref 4.0–10.5)
nRBC: 0 % (ref 0.0–0.2)

## 2020-05-05 LAB — GLUCOSE, CAPILLARY: Glucose-Capillary: 89 mg/dL (ref 70–99)

## 2020-05-05 MED ORDER — FLUDEOXYGLUCOSE F - 18 (FDG) INJECTION
6.2000 | Freq: Once | INTRAVENOUS | Status: AC | PRN
  Administered 2020-05-05: 6.616 via INTRAVENOUS

## 2020-05-07 ENCOUNTER — Ambulatory Visit: Payer: Medicare HMO | Admitting: Oncology

## 2020-05-07 ENCOUNTER — Encounter: Payer: Self-pay | Admitting: Oncology

## 2020-05-08 NOTE — Progress Notes (Signed)
Zachary Gilmore  Telephone:([phone]) [phone] Fax:(336) [phone]  ID: Zachary Gilmore OB: 1956/08/30  MR#: [id number]  SAY#:[id number]  Patient Care Team: Carlos American, FNP as PCP - General (Family Medicine) Lloyd Huger, MD as Consulting Physician (Hematology and Oncology) Lloyd Huger, MD as Consulting Physician (Oncology) Clyde Canterbury, MD as Referring Physician (Otolaryngology)  I connected with Zachary Gilmore on 05/10/20 at 10:00 AM EDT by video enabled telemedicine visit and verified that I am speaking with the correct person using two identifiers.   I discussed the limitations, risks, security and privacy concerns of performing an evaluation and management service by telemedicine and the availability of in-person appointments. I also discussed with the patient that there may be a patient responsible charge related to this service. The patient expressed understanding and agreed to proceed.   Other persons participating in the visit and their role in the encounter: Patient, MD.  Patient's location: Clinic. Provider's location: Home.  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  He is accompanied by his sister-in-law in clinic today.  He reports a poor appetite as well as weakness and fatigue, but otherwise feels well.  He denies any dysphagia.  He has no neurologic complaints.  He denies any recent fevers or illnesses.  He has no chest pain, shortness of breath, cough, or hemoptysis.  He denies any nausea, vomiting, constipation, or diarrhea.  He has no urinary complaints.  Patient offers no further specific complaints today.  REVIEW OF SYSTEMS:   Review of Systems  Constitutional: Positive for malaise/fatigue. Negative for fever and weight loss.  HENT: Negative for sore throat.   Respiratory:  Negative.  Negative for cough and shortness of breath.   Cardiovascular: Negative.  Negative for chest pain and leg swelling.  Gastrointestinal: Negative.  Negative for abdominal pain, blood in stool and melena.  Genitourinary: Negative.  Negative for dysuria.  Musculoskeletal: Negative.  Negative for back pain, joint pain and neck pain.  Skin: Negative.  Negative for rash.  Neurological: Positive for weakness. Negative for dizziness, focal weakness and headaches.  Psychiatric/Behavioral: Negative.  The patient is not nervous/anxious.     As per HPI. Otherwise, a complete review of systems is negative.  PAST MEDICAL HISTORY: Past Medical History:  Diagnosis Date  . Alcohol abuse   . Colorectal cancer ([place])   . GERD (gastroesophageal reflux disease)   . Hepatitis   . History of substance abuse ([place])   . Neuropathy   . Osteoarthritis   . Prostate cancer ([place])   . Seizures ([place])    as a child  . Shoulder pain   . Tobacco use     PAST SURGICAL HISTORY: Past Surgical History:  Procedure Laterality Date  . CARDIAC CATHETERIZATION N/A 10/03/2016   Procedure: Left Heart Cath and Coronary Angiography;  Surgeon: Corey Skains, MD;  Location: [facility];  Service: Cardiovascular;  Laterality: N/A;  . COLON SURGERY    . colorectal cancer     "scraped off"   . PROSTATE BIOPSY    . SHOULDER ARTHROSCOPY WITH BICEPS TENDON REPAIR Right 10/03/2016   Procedure: SHOULDER ARTHROSCOPIC LIMITED DEBRIDEMENT  WITH BICEPS TENOLYSIS;  Surgeon: Corky Mull, MD;  Location: [facility];  Service: Orthopedics;  Laterality: Right;  . Stomach ulcers      FAMILY HISTORY: Family History  Problem Relation  Age of Onset  . Heart attack Mother   . Colon cancer Father   . Cancer Father        prostate  . Cancer Brother        prostate    ADVANCED DIRECTIVES (Y/N):  N  HEALTH MAINTENANCE: Social History   Tobacco Use  . Smoking status: Current Every Day Smoker    Packs/day: 0.50     Years: 45.00    Pack years: 22.50    Types: Cigarettes  . Smokeless tobacco: Never Used  Vaping Use  . Vaping Use: Never used  Substance Use Topics  . Alcohol use: Yes    Comment: occasional glass of wine  . Drug use: Yes    Types: Marijuana     Colonoscopy:  PAP:  Bone density:  Lipid panel:  No Known Allergies  Current Outpatient Medications  Medication Sig Dispense Refill  . Cholecalciferol (D3-1000) 25 MCG (1000 UT) tablet Take 1,000 Units by mouth daily.    [name] docusate sodium (COLACE) 100 MG capsule Take 100 mg by mouth at bedtime.     . donepezil (ARICEPT) 10 MG tablet Take 10 mg by mouth at bedtime.     . gabapentin (NEURONTIN) 300 MG capsule Take 300 mg by mouth 3 (three) times daily.     . mirtazapine (REMERON) 7.5 MG tablet Take 7.5 mg by mouth at bedtime.     . naltrexone (DEPADE) 50 MG tablet Take 50 mg by mouth daily.    . tamsulosin (FLOMAX) 0.4 MG CAPS capsule Take 0.4 mg by mouth daily after supper.     [name] acetaminophen (TYLENOL) 500 MG tablet Take 500 mg by mouth every 6 (six) hours as needed for mild pain.  (Patient not taking: Reported on 03/25/2020)    . magic mouthwash SOLN Take 5-10 mLs by mouth 4 (four) times daily as needed for mouth pain. (Patient not taking: Reported on 03/25/2020) 480 mL 0  . ondansetron (ZOFRAN) 8 MG tablet Take 8 mg by mouth daily.     No current facility-administered medications for this visit.    OBJECTIVE: Vitals:   05/10/20 0953  BP: 126/75  Pulse: (!) 58  Temp: (!) 96.9 F (36.1 C)  SpO2: 99%     Body mass index is 17.91 kg/m.    ECOG FS:0 - Asymptomatic  General: Well-developed, well-nourished, no acute distress. HEENT: Normocephalic. Neuro: Alert, answering all questions appropriately. Cranial nerves grossly intact. Psych: Normal affect.   LAB RESULTS:  Lab Results  Component Value Date   NA 136 05/05/2020   K 4.7 05/05/2020   CL 98 05/05/2020   CO2 31 05/05/2020   GLUCOSE 104 (H) 05/05/2020   BUN 7 (L)  05/05/2020   CREATININE 1.16 05/05/2020   CALCIUM 9.1 05/05/2020   PROT 6.9 03/25/2020   ALBUMIN 4.0 03/25/2020   AST 18 03/25/2020   ALT 12 03/25/2020   ALKPHOS 74 03/25/2020   BILITOT 0.6 03/25/2020   GFRNONAA >60 05/05/2020   GFRAA >60 05/05/2020    Lab Results  Component Value Date   WBC 7.6 05/05/2020   NEUTROABS 6.0 05/05/2020   HGB 13.9 05/05/2020   HCT 39.1 05/05/2020   MCV 89.9 05/05/2020   PLT 212 05/05/2020     STUDIES: NM PET Image Restag (PS) Skull Base To Thigh  Result Date: 05/05/2020 CLINICAL DATA:  Subsequent treatment strategy for squamous cell carcinoma of the right neck. EXAM: NUCLEAR MEDICINE PET SKULL BASE TO THIGH TECHNIQUE: 6.6 mCi F-18  FDG was injected intravenously. Full-ring PET imaging was performed from the skull base to thigh after the radiotracer. CT data was obtained and used for attenuation correction and anatomic localization. Fasting blood glucose: Eighty-nine mg/dl COMPARISON:  Multiple exams, including 10/20/2019 FINDINGS: Mediastinal blood pool activity: SUV max 1.8 Liver activity: SUV max NA NECK: Residual indistinct density in the right level II nodal chain region measures about 1.1 cm in thickness on image 40/3 (previously 2.2 cm, maximum SUV 1.9 (formerly 14.1) no new adenopathy or new hypermetabolic activity is identified. Incidental CT findings: Subcutaneous cystic lesion along the right facial tissues is not hypermetabolic and measures 2.9 by 2.3 cm image 37/3, roughly stable from prior. Similar lesion posterior to the lower cervical spine in the left paracentral region, unchanged from prior. These likely represent sebaceous cysts or similar benign lesions. CHEST: No significant abnormal hypermetabolic activity in this region. Incidental CT findings: Coronary, aortic arch, and branch vessel atherosclerotic vascular disease. Centrilobular emphysema. Architectural distortion/sub solid nodule in the left upper lobe measuring 0.8 cm in diameter on  image 104/3 without accentuated metabolic activity. ABDOMEN/PELVIS: Gastric activity reduced from prior and likely physiologic. Incidental CT findings: Aortoiliac atherosclerotic vascular disease. Dependent density in the gallbladder likely from sludge or small gallstones. Mild nonspecific increase in right perirenal stranding fiducials along the prostate gland. Mildly increased presacral edema. SKELETON: There is a hypermetabolic permeative lesion of the left iliac bone with maximum SUV 12.7, and abnormal activity measuring approximately 4.3 by 1.7 by 5.3 cm. This is almost imperceptible on the CT data but there is some fine permeative cortical demineralization and subtle lucency in this vicinity appreciable on today's exam. Neither the abnormal lucency nor the abnormal activity was present on the prior PET-CT of 10/20/2019. Incidental CT findings: none IMPRESSION: 1. Marked reduction in size and activity of the previous conglomerate right level II nodal mass in the neck. Current maximum SUV is 1.9, about the same as blood pool, previous 14.1. 2. However, there is a new hypermetabolic lesion of the left iliac bone measuring up to 5.3 cm in long axis, with subtle lucency/permeative appearance but without visible extraosseous extension, highly suspicious for malignancy. 3. The 8 mm sub solid focus of architectural distortion in the right upper lobe remains without accentuated metabolic activity, and could be a small region of scarring or possibly low-grade adenocarcinoma, surveillance is recommended. 4. Mild nonspecific increase in subtle presacral edema and right greater than left perirenal stranding. 5. Other imaging findings of potential clinical significance: Aortic Atherosclerosis (ICD10-I70.0) and Emphysema (ICD10-J43.9). Sludge versus small gallstones in the gallbladder. Electronically Signed   By: Van Clines M.D.   On: 05/05/2020 14:36    ASSESSMENT: Stage IVa squamous cell carcinoma of head and  neck.  PLAN:    1.  Stage IVa squamous cell carcinoma of head and neck: Biopsy results from October 08, 2019 consistent with squamous cell carcinoma.  Patient completed weekly cisplatin on January 02, 2020 and then completed daily XRT on January 08, 2020.  PET scan results from May 05, 2020 reviewed independently and report as above with essential resolution of disease in patient's neck, however there is a new hypermetabolic lesion in the left iliac bone measuring 5.3 cm.  This is new from his previous PET scan.  Although an unusual spot for metastatic disease, there is no other hypermetabolism noted on PET scan.  Will further discuss at tumor board later this week to determine whether biopsy is necessary.  Follow-up will be based on  tumor board discussion.   2.  Shoulder pain: Patient does not complain of this today.  Monitor.  Continue Tylenol as needed. 3.  Dysphagia: Patient has been given a referral back to ENT. 4.  Poor appetite: Consider dietary referral after ENT evaluation.  I provided 20 minutes of face-to-face video visit time during this encounter which included chart review, counseling, and coordination of care as documented above.   Patient expressed understanding and was in agreement with this plan. He also understands that He can call clinic at any time with any questions, concerns, or complaints.   Cancer Staging Squamous cell carcinoma of head and neck (HCC) Staging form: Cervical Lymph Nodes and Unknown Primary Tumors of the Head and Neck, AJCC 8th Edition - Clinical stage from 11/07/2019: Stage IVA (cT0, cN2a, cM0) - Signed by Lloyd Huger, MD on 11/07/2019   Lloyd Huger, MD   05/10/2020 12:52 PM

## 2020-05-10 ENCOUNTER — Encounter (INDEPENDENT_AMBULATORY_CARE_PROVIDER_SITE_OTHER): Payer: Self-pay

## 2020-05-10 ENCOUNTER — Other Ambulatory Visit: Payer: Self-pay

## 2020-05-10 ENCOUNTER — Inpatient Hospital Stay (HOSPITAL_BASED_OUTPATIENT_CLINIC_OR_DEPARTMENT_OTHER): Payer: Medicare HMO | Admitting: Oncology

## 2020-05-10 VITALS — BP 126/75 | HR 58 | Temp 96.9°F | Wt 121.3 lb

## 2020-05-10 DIAGNOSIS — C760 Malignant neoplasm of head, face and neck: Secondary | ICD-10-CM | POA: Diagnosis not present

## 2020-05-10 NOTE — Progress Notes (Signed)
Patient states he is having some trouble swallowing. He also reports a decreased appetite. He is currently using ensure to help. He denies other questions or concerns at this time.

## 2020-05-13 ENCOUNTER — Other Ambulatory Visit: Payer: Self-pay | Admitting: *Deleted

## 2020-05-13 ENCOUNTER — Other Ambulatory Visit: Payer: Medicare HMO

## 2020-05-13 DIAGNOSIS — C760 Malignant neoplasm of head, face and neck: Secondary | ICD-10-CM

## 2020-05-13 NOTE — Progress Notes (Signed)
Tumor Board Documentation  Zachary Gilmore was presented by Dr Grayland Ormond at our Tumor Board on 05/13/2020, which included representatives from medical oncology, radiation oncology, surgical oncology, internal medicine, navigation, pathology, radiology, surgical, pharmacy, genetics, research, palliative care.  Zachary Gilmore currently presents as a current patient, for discussion with history of the following treatments: neoadjuvant chemoradiation, active survellience.  Additionally, we reviewed previous medical and familial history, history of present illness, and recent lab results along with all available histopathologic and imaging studies. The tumor board considered available treatment options and made the following recommendations: Biopsy (CT Guided)    The following procedures/referrals were also placed: No orders of the defined types were placed in this encounter.   Clinical Trial Status: not discussed   Staging used: Not Applicable  National site-specific guidelines   were discussed with respect to the case.  Tumor board is a meeting of clinicians from various specialty areas who evaluate and discuss patients for whom a multidisciplinary approach is being considered. Final determinations in the plan of care are those of the provider(s). The responsibility for follow up of recommendations given during tumor board is that of the provider.   Today's extended care, comprehensive team conference, Zachary Gilmore was not present for the discussion and was not examined.   Multidisciplinary Tumor Board is a multidisciplinary case peer review process.  Decisions discussed in the Multidisciplinary Tumor Board reflect the opinions of the specialists present at the conference without having examined the patient.  Ultimately, treatment and diagnostic decisions rest with the primary provider(s) and the patient.

## 2020-05-18 ENCOUNTER — Telehealth: Payer: Self-pay | Admitting: *Deleted

## 2020-05-18 NOTE — Telephone Encounter (Signed)
[name] informed of pts upcoming appt for biopsy, biopsy scheduled for 9/14 at 11:00. Pt to arrive at 10:00. [name] verbalized understanding of appt details.

## 2020-05-21 ENCOUNTER — Other Ambulatory Visit: Payer: Self-pay | Admitting: Radiology

## 2020-05-24 ENCOUNTER — Other Ambulatory Visit: Payer: Self-pay | Admitting: Student

## 2020-05-25 ENCOUNTER — Ambulatory Visit: Payer: Medicare HMO

## 2020-05-25 ENCOUNTER — Other Ambulatory Visit: Payer: Self-pay

## 2020-05-25 ENCOUNTER — Ambulatory Visit
Admission: RE | Admit: 2020-05-25 | Discharge: 2020-05-25 | Disposition: A | Discharge status: Home / Self Care | Payer: Medicare HMO | Source: Ambulatory Visit | Attending: Oncology | Admitting: Oncology

## 2020-05-25 DIAGNOSIS — K219 Gastro-esophageal reflux disease without esophagitis: Secondary | ICD-10-CM | POA: Insufficient documentation

## 2020-05-25 DIAGNOSIS — Z9221 Personal history of antineoplastic chemotherapy: Secondary | ICD-10-CM | POA: Diagnosis not present

## 2020-05-25 DIAGNOSIS — Z923 Personal history of irradiation: Secondary | ICD-10-CM | POA: Insufficient documentation

## 2020-05-25 DIAGNOSIS — M899 Disorder of bone, unspecified: Secondary | ICD-10-CM | POA: Diagnosis present

## 2020-05-25 DIAGNOSIS — K759 Inflammatory liver disease, unspecified: Secondary | ICD-10-CM | POA: Diagnosis not present

## 2020-05-25 DIAGNOSIS — F17210 Nicotine dependence, cigarettes, uncomplicated: Secondary | ICD-10-CM | POA: Insufficient documentation

## 2020-05-25 DIAGNOSIS — Z85038 Personal history of other malignant neoplasm of large intestine: Secondary | ICD-10-CM | POA: Insufficient documentation

## 2020-05-25 DIAGNOSIS — Z85828 Personal history of other malignant neoplasm of skin: Secondary | ICD-10-CM | POA: Insufficient documentation

## 2020-05-25 DIAGNOSIS — M1990 Unspecified osteoarthritis, unspecified site: Secondary | ICD-10-CM | POA: Insufficient documentation

## 2020-05-25 DIAGNOSIS — Z79899 Other long term (current) drug therapy: Secondary | ICD-10-CM | POA: Insufficient documentation

## 2020-05-25 DIAGNOSIS — C760 Malignant neoplasm of head, face and neck: Secondary | ICD-10-CM

## 2020-05-25 DIAGNOSIS — M25511 Pain in right shoulder: Secondary | ICD-10-CM | POA: Insufficient documentation

## 2020-05-25 DIAGNOSIS — Z8546 Personal history of malignant neoplasm of prostate: Secondary | ICD-10-CM | POA: Insufficient documentation

## 2020-05-25 DIAGNOSIS — C7951 Secondary malignant neoplasm of bone: Secondary | ICD-10-CM | POA: Diagnosis not present

## 2020-05-25 LAB — PROTIME-INR
INR: 1.1 (ref 0.8–1.2)
Prothrombin Time: 13.6 seconds (ref 11.4–15.2)

## 2020-05-25 LAB — CBC
HCT: 41.1 % (ref 39.0–52.0)
Hemoglobin: 14.2 g/dL (ref 13.0–17.0)
MCH: 31.4 pg (ref 26.0–34.0)
MCHC: 34.5 g/dL (ref 30.0–36.0)
MCV: 90.9 fL (ref 80.0–100.0)
Platelets: 241 10*3/uL (ref 150–400)
RBC: 4.52 MIL/uL (ref 4.22–5.81)
RDW: 12.3 % (ref 11.5–15.5)
WBC: 8.6 10*3/uL (ref 4.0–10.5)
nRBC: 0 % (ref 0.0–0.2)

## 2020-05-25 MED ORDER — MIDAZOLAM HCL 2 MG/2ML IJ SOLN
INTRAMUSCULAR | Status: AC
  Filled 2020-05-25: qty 2

## 2020-05-25 MED ORDER — MIDAZOLAM HCL 2 MG/2ML IJ SOLN
INTRAMUSCULAR | Status: AC | PRN
  Administered 2020-05-25: 1 mg via INTRAVENOUS

## 2020-05-25 MED ORDER — HYDROCODONE-ACETAMINOPHEN 5-325 MG PO TABS
1.0000 | ORAL_TABLET | ORAL | Status: DC | PRN
  Filled 2020-05-25: qty 2

## 2020-05-25 MED ORDER — FENTANYL CITRATE (PF) 100 MCG/2ML IJ SOLN
INTRAMUSCULAR | Status: AC
  Filled 2020-05-25: qty 2

## 2020-05-25 MED ORDER — SODIUM CHLORIDE 0.9 % IV SOLN: INTRAVENOUS | Status: DC

## 2020-05-25 MED ORDER — FENTANYL CITRATE (PF) 100 MCG/2ML IJ SOLN
INTRAMUSCULAR | Status: AC | PRN
Start: 2020-05-25 — End: 2020-05-25
  Administered 2020-05-25: 50 ug via INTRAVENOUS

## 2020-05-25 NOTE — H&P (Signed)
Chief Complaint: Patient was seen in consultation today for a left iliac lesion biopsy.  Referring Physician(s): Finnegan,Timothy J  Supervising Physician: Arne Cleveland  Patient Status: [facility] - Out-pt  History of Present Illness: Zachary Gilmore is a 64 y.o. male with a past medical history significant for ETOH abuse, tobacco use, hepatitis, GERD, colorectal cancer, prostate cancer and squamous cell carcinoma of the head and neck (stage IVa) who presents today for a left iliac bone lesion biopsy. Zachary Gilmore was first diagnosed with squamous cell carcinoma of the head and neck in January of this year and underwent systemic therapy and radiation which concluded in April. In August a PET scan showed resolution of disease in the neck however a new hypermetabolic lesion was noted in the left iliac bone - IR has been asked to perform a biopsy of this lesion to further direct care.  Zachary Gilmore reports right shoulder pain which is chronic, he tells me his shoulder dislocates a lot and causes him pain. He has otherwise been feeling well and is ready to proceed with the biopsy today.  Past Medical History:  Diagnosis Date  . Alcohol abuse   . Colorectal cancer ([place])   . GERD (gastroesophageal reflux disease)   . Hepatitis   . History of substance abuse ([place])   . Neuropathy   . Osteoarthritis   . Prostate cancer ([place])   . Seizures ([place])    as a child  . Shoulder pain   . Tobacco use     Past Surgical History:  Procedure Laterality Date  . CARDIAC CATHETERIZATION N/A 10/03/2016   Procedure: Left Heart Cath and Coronary Angiography;  Surgeon: Corey Skains, MD;  Location: [facility];  Service: Cardiovascular;  Laterality: N/A;  . COLON SURGERY    . colorectal cancer     "scraped off"   . PROSTATE BIOPSY    . SHOULDER ARTHROSCOPY WITH BICEPS TENDON REPAIR Right 10/03/2016   Procedure: SHOULDER ARTHROSCOPIC LIMITED DEBRIDEMENT  WITH BICEPS TENOLYSIS;  Surgeon: Corky Mull, MD;  Location: [facility];  Service: Orthopedics;  Laterality: Right;  . Stomach ulcers      Allergies: Patient has no known allergies.  Medications: Prior to Admission medications   Medication Sig Start Date End Date Taking? Authorizing Provider  acetaminophen (TYLENOL) 500 MG tablet Take 500 mg by mouth every 6 (six) hours as needed for mild pain.  Patient not taking: Reported on 03/25/2020    [provider]  Cholecalciferol (D3-1000) 25 MCG (1000 UT) tablet Take 1,000 Units by mouth daily.    [provider]  docusate sodium (COLACE) 100 MG capsule Take 100 mg by mouth at bedtime.     [provider]  donepezil (ARICEPT) 10 MG tablet Take 10 mg by mouth at bedtime.  08/04/16   [provider]  gabapentin (NEURONTIN) 300 MG capsule Take 300 mg by mouth 3 (three) times daily.  08/29/19   [provider]  magic mouthwash SOLN Take 5-10 mLs by mouth 4 (four) times daily as needed for mouth pain. Patient not taking: Reported on 03/25/2020 12/26/19   Lloyd Huger, MD  mirtazapine (REMERON) 7.5 MG tablet Take 7.5 mg by mouth at bedtime.  10/21/19   [provider]  naltrexone (DEPADE) 50 MG tablet Take 50 mg by mouth daily.    [provider]  ondansetron (ZOFRAN) 8 MG tablet Take 8 mg by mouth daily. 03/30/20   [provider]  tamsulosin (  FLOMAX) 0.4 MG CAPS capsule Take 0.4 mg by mouth daily after supper.  08/29/19   [provider]     Family History  Problem Relation Age of Onset  . Heart attack Mother   . Colon cancer Father   . Cancer Father        prostate  . Cancer Brother        prostate    Social History   Socioeconomic History  . Marital status: Single    Spouse name: Not on file  . Number of children: Not on file  . Years of education: Not on file  . Highest education level: Not on file  Occupational History    Comment: unemployed  Tobacco Use  . Smoking status: Current Every  Day Smoker    Packs/day: 0.50    Years: 45.00    Pack years: 22.50    Types: Cigarettes  . Smokeless tobacco: Never Used  Vaping Use  . Vaping Use: Never used  Substance and Sexual Activity  . Alcohol use: Yes    Comment: occasional glass of wine  . Drug use: Yes    Types: Marijuana  . Sexual activity: Never  Other Topics Concern  . Not on file  Social History Narrative   Lives at [facility] in [place] close to [place]. Reports living at [facility] for 22 plus years.    Social Determinants of Health   Financial Resource Strain:   . Difficulty of Paying Living Expenses: Not on file  Food Insecurity:   . Worried About Charity fundraiser in the Last Year: Not on file  . Ran Out of Food in the Last Year: Not on file  Transportation Needs:   . Lack of Transportation (Medical): Not on file  . Lack of Transportation (Non-Medical): Not on file  Physical Activity:   . Days of Exercise per Week: Not on file  . Minutes of Exercise per Session: Not on file  Stress:   . Feeling of Stress : Not on file  Social Connections:   . Frequency of Communication with Friends and Family: Not on file  . Frequency of Social Gatherings with Friends and Family: Not on file  . Attends Religious Services: Not on file  . Active Member of Clubs or Organizations: Not on file  . Attends Archivist Meetings: Not on file  . Marital Status: Not on file     Review of Systems: A 12 point ROS discussed and pertinent positives are indicated in the HPI above.  All other systems are negative.  Review of Systems  Constitutional: Negative for appetite change, chills and fever.  Respiratory: Negative for cough and shortness of breath.   Cardiovascular: Negative for chest pain.  Gastrointestinal: Negative for abdominal pain, diarrhea, nausea and vomiting.  Musculoskeletal: Positive for arthralgias (+) right shoulder pain - chronic, unchanged today. Negative for back pain.  Skin: Negative for  color change.  Neurological: Negative for dizziness.    Vital Signs: BP 138/74   Pulse (!) 59   Resp 18   Ht 5\' 9"  (1.753 m)   Wt 145 lb (65.8 kg)   SpO2 100%   BMI 21.41 kg/m   Physical Exam Vitals reviewed.  Constitutional:      General: He is not in acute distress. HENT:     Head: Normocephalic.     Mouth/Throat:     Mouth: Mucous membranes are moist.     Pharynx: Oropharynx is clear. No oropharyngeal exudate  or posterior oropharyngeal erythema.     Comments: Poor dentition Cardiovascular:     Rate and Rhythm: Normal rate and regular rhythm.  Pulmonary:     Effort: Pulmonary effort is normal.     Breath sounds: Normal breath sounds.  Abdominal:     General: There is no distension.     Palpations: Abdomen is soft.     Tenderness: There is no abdominal tenderness.  Skin:    General: Skin is warm and dry.  Neurological:     Mental Status: He is alert and oriented to person, place, and time.  Psychiatric:        Mood and Affect: Mood normal.        Behavior: Behavior normal.        Thought Content: Thought content normal.        Judgment: Judgment normal.      MD Evaluation Airway: WNL Heart: WNL Abdomen: WNL Chest/ Lungs: WNL ASA  Classification: 3 Mallampati/Airway Score: One (Poor dentition)   Imaging: NM PET Image Restag (PS) Skull Base To Thigh  Result Date: 05/05/2020 CLINICAL DATA:  Subsequent treatment strategy for squamous cell carcinoma of the right neck. EXAM: NUCLEAR MEDICINE PET SKULL BASE TO THIGH TECHNIQUE: 6.6 mCi F-18 FDG was injected intravenously. Full-ring PET imaging was performed from the skull base to thigh after the radiotracer. CT data was obtained and used for attenuation correction and anatomic localization. Fasting blood glucose: Eighty-nine mg/dl COMPARISON:  Multiple exams, including 10/20/2019 FINDINGS: Mediastinal blood pool activity: SUV max 1.8 Liver activity: SUV max NA NECK: Residual indistinct density in the right level II  nodal chain region measures about 1.1 cm in thickness on image 40/3 (previously 2.2 cm, maximum SUV 1.9 (formerly 14.1) no new adenopathy or new hypermetabolic activity is identified. Incidental CT findings: Subcutaneous cystic lesion along the right facial tissues is not hypermetabolic and measures 2.9 by 2.3 cm image 37/3, roughly stable from prior. Similar lesion posterior to the lower cervical spine in the left paracentral region, unchanged from prior. These likely represent sebaceous cysts or similar benign lesions. CHEST: No significant abnormal hypermetabolic activity in this region. Incidental CT findings: Coronary, aortic arch, and branch vessel atherosclerotic vascular disease. Centrilobular emphysema. Architectural distortion/sub solid nodule in the left upper lobe measuring 0.8 cm in diameter on image 104/3 without accentuated metabolic activity. ABDOMEN/PELVIS: Gastric activity reduced from prior and likely physiologic. Incidental CT findings: Aortoiliac atherosclerotic vascular disease. Dependent density in the gallbladder likely from sludge or small gallstones. Mild nonspecific increase in right perirenal stranding fiducials along the prostate gland. Mildly increased presacral edema. SKELETON: There is a hypermetabolic permeative lesion of the left iliac bone with maximum SUV 12.7, and abnormal activity measuring approximately 4.3 by 1.7 by 5.3 cm. This is almost imperceptible on the CT data but there is some fine permeative cortical demineralization and subtle lucency in this vicinity appreciable on today's exam. Neither the abnormal lucency nor the abnormal activity was present on the prior PET-CT of 10/20/2019. Incidental CT findings: none IMPRESSION: 1. Marked reduction in size and activity of the previous conglomerate right level II nodal mass in the neck. Current maximum SUV is 1.9, about the same as blood pool, previous 14.1. 2. However, there is a new hypermetabolic lesion of the left iliac  bone measuring up to 5.3 cm in long axis, with subtle lucency/permeative appearance but without visible extraosseous extension, highly suspicious for malignancy. 3. The 8 mm sub solid focus of architectural distortion in the right  upper lobe remains without accentuated metabolic activity, and could be a small region of scarring or possibly low-grade adenocarcinoma, surveillance is recommended. 4. Mild nonspecific increase in subtle presacral edema and right greater than left perirenal stranding. 5. Other imaging findings of potential clinical significance: Aortic Atherosclerosis (ICD10-I70.0) and Emphysema (ICD10-J43.9). Sludge versus small gallstones in the gallbladder. Electronically Signed   By: Van Clines M.D.   On: 05/05/2020 14:36    Labs:  CBC: Recent Labs    01/08/20 1017 02/27/20 1142 03/25/20 0946 05/05/20 1124  WBC 2.1* 6.4 6.6 7.6  HGB 11.0* 13.7 13.7 13.9  HCT 31.9* 39.2 37.9* 39.1  PLT 121* 217 206 212    COAGS: No results for input(s): INR, APTT in the last 8760 hours.  BMP: Recent Labs    01/08/20 1017 02/27/20 1142 03/25/20 0946 05/05/20 1124  NA 133* 136 133* 136  K 4.8 4.9 4.4 4.7  CL 93* 98 95* 98  CO2 29 29 30 31   GLUCOSE 110* 89 115* 104*  BUN 21 8 10  7*  CALCIUM 8.7* 9.3 9.1 9.1  CREATININE 1.16 0.86 1.06 1.16  GFRNONAA >60 >60 >60 >60  GFRAA >60 >60 >60 >60    LIVER FUNCTION TESTS: Recent Labs    12/05/19 0808 12/12/19 0820 12/19/19 0813 03/25/20 0946  BILITOT 0.4 0.5 0.5 0.6  AST 16 16 15 18   ALT 13 14 11 12   ALKPHOS 98 104 96 74  PROT 6.7 6.8 6.7 6.9  ALBUMIN 3.4* 3.4* 3.3* 4.0    TUMOR MARKERS: No results for input(s): AFPTM, CEA, CA199, CHROMGRNA in the last 8760 hours.  Assessment and Plan:  64 y/o M with history of squamous cell carcinoma of the head and neck s/p systemic therapy and radiation found to have new hypermetabolic lesion of the left iliac bone. He presents today for a biopsy of this left iliac bone lesion to  further direct care.  Patient has been NPO since midnight, no current anticoagulation/antiplatelet medications. WBC 8.6, hgb 14.2, plt 241, INR pending.  Risks and benefits of left iliac bone lesion biopsy was discussed with the patient and/or patient's family including, but not limited to bleeding, infection, damage to adjacent structures or low yield requiring additional tests.  All of the questions were answered and there is agreement to proceed.  Consent signed and in chart.  Thank you for this interesting consult.  I greatly enjoyed meeting ALBERTUS CHIARELLI and look forward to participating in their care.  A copy of this report was sent to the requesting provider on this date.  Electronically Signed: Joaquim Nam, PA-C 05/25/2020, 8:35 AM   I spent a total of  30 Minutes in face to face in clinical consultation, greater than 50% of which was counseling/coordinating care for left iliac bone lesion biopsy.

## 2020-05-25 NOTE — Procedures (Signed)
  Procedure: Ct CORE BX Left iliac lesion   EBL:   minimal Complications:  none immediate  See full dictation in [facility].  Dillard Cannon MD Main # [phone] Pager  [phone]

## 2020-05-25 NOTE — Discharge Instructions (Signed)
Bone Marrow Aspiration and Bone Marrow Biopsy, Adult, Care After This sheet gives you information about how to care for yourself after your procedure. Your health care provider may also give you more specific instructions. If you have problems or questions, contact your health care provider. What can I expect after the procedure? After the procedure, it is common to have:  Mild pain and tenderness.  Swelling.  Bruising. Follow these instructions at home: Puncture site care   Follow instructions from your health care provider about how to take care of the puncture site. Make sure you: ? Wash your hands with soap and water before and after you change your bandage (dressing). If soap and water are not available, use hand sanitizer. ? Change your dressing as told by your health care provider.  Check your puncture site every day for signs of infection. Check for: ? More redness, swelling, or pain. ? Fluid or blood. ? Warmth. ? Pus or a bad smell. Activity  Return to your normal activities as told by your health care provider. Ask your health care provider what activities are safe for you.  Do not lift anything that is heavier than 10 lb (4.5 kg), or the limit that you are told, until your health care provider says that it is safe.  Do not drive for 24 hours if you were given a sedative during your procedure. General instructions   Take over-the-counter and prescription medicines only as told by your health care provider.  Do not take baths, swim, or use a hot tub until your health care provider approves. Ask your health care provider if you may take showers. You may only be allowed to take sponge baths.  If directed, put ice on the affected area. To do this: ? Put ice in a plastic bag. ? Place a towel between your skin and the bag. ? Leave the ice on for 20 minutes, 2-3 times a day.  Keep all follow-up visits as told by your health care provider. This is important. Contact a  health care provider if:  Your pain is not controlled with medicine.  You have a fever.  You have more redness, swelling, or pain around the puncture site.  You have fluid or blood coming from the puncture site.  Your puncture site feels warm to the touch.  You have pus or a bad smell coming from the puncture site. Summary  After the procedure, it is common to have mild pain, tenderness, swelling, and bruising.  Follow instructions from your health care provider about how to take care of the puncture site and what activities are safe for you.  Take over-the-counter and prescription medicines only as told by your health care provider.  Contact a health care provider if you have any signs of infection, such as fluid or blood coming from the puncture site. This information is not intended to replace advice given to you by your health care provider. Make sure you discuss any questions you have with your health care provider. Document Revised: 01/14/2019 Document Reviewed: 01/14/2019 Elsevier Patient Education  [place]. Moderate Conscious Sedation, Adult, Care After These instructions provide you with information about caring for yourself after your procedure. Your health care provider may also give you more specific instructions. Your treatment has been planned according to current medical practices, but problems sometimes occur. Call your health care provider if you have any problems or questions after your procedure. What can I expect after the procedure? After your procedure,  it is common:  To feel sleepy for several hours.  To feel clumsy and have poor balance for several hours.  To have poor judgment for several hours.  To vomit if you eat too soon. Follow these instructions at home: For at least 24 hours after the procedure:   Do not: ? Participate in activities where you could fall or become injured. ? Drive. ? Use heavy machinery. ? Drink alcohol. ? Take  sleeping pills or medicines that cause drowsiness. ? Make important decisions or sign legal documents. ? Take care of children on your own.  Rest. Eating and drinking  Follow the diet recommended by your health care provider.  If you vomit: ? Drink water, juice, or soup when you can drink without vomiting. ? Make sure you have little or no nausea before eating solid foods. General instructions  Have a responsible adult stay with you until you are awake and alert.  Take over-the-counter and prescription medicines only as told by your health care provider.  If you smoke, do not smoke without supervision.  Keep all follow-up visits as told by your health care provider. This is important. Contact a health care provider if:  You keep feeling nauseous or you keep vomiting.  You feel light-headed.  You develop a rash.  You have a fever. Get help right away if:  You have trouble breathing. This information is not intended to replace advice given to you by your health care provider. Make sure you discuss any questions you have with your health care provider. Document Revised: 08/10/2017 Document Reviewed: 12/18/2015 Elsevier Patient Education  2020 [employer].

## 2020-05-26 ENCOUNTER — Other Ambulatory Visit: Payer: Self-pay | Admitting: Pathology

## 2020-05-26 LAB — SURGICAL PATHOLOGY

## 2020-05-30 NOTE — Progress Notes (Signed)
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  He is accompanied by his brother today.  He continues to have weakness and fatigue, but otherwise feels well.  He denies any pain.  He denies any dysphagia.  He has no neurologic complaints.  He denies any recent fevers or illnesses.  He has no chest pain, shortness of breath, cough, or hemoptysis.  He denies any nausea, vomiting, constipation, or diarrhea.  He has no urinary complaints.  Patient offers no further specific complaints today.  REVIEW OF SYSTEMS:   Review of Systems  Constitutional: Positive for malaise/fatigue. Negative for fever and weight loss.  HENT: Negative for sore throat.   Respiratory: Negative.  Negative for cough and shortness of breath.   Cardiovascular: Negative.  Negative for chest pain and leg swelling.  Gastrointestinal: Negative.  Negative for abdominal pain, blood in stool and melena.  Genitourinary: Negative.  Negative for dysuria.  Musculoskeletal: Negative.  Negative for back pain, joint pain and neck pain.  Skin: Negative.  Negative for rash.  Neurological: Positive for weakness. Negative for dizziness, focal weakness and headaches.  Psychiatric/Behavioral: Negative.  The patient is not nervous/anxious.     As per HPI. Otherwise, a complete review of systems is negative.  PAST MEDICAL HISTORY: Past Medical History:  Diagnosis Date  .  Alcohol abuse   . Colorectal cancer (Zachary Gilmore)   . GERD (gastroesophageal reflux disease)   . Hepatitis   . History of substance abuse (Zachary Gilmore)   . Neuropathy   . Osteoarthritis   . Prostate cancer (Zachary Gilmore)   . Seizures (Zachary Gilmore)    as a child  . Shoulder pain   . Tobacco use     PAST SURGICAL HISTORY: Past Surgical History:  Procedure Laterality Date  . CARDIAC CATHETERIZATION N/A 10/03/2016   Procedure: Left Heart Cath and Coronary Angiography;  Surgeon: Corey Skains, MD;  Location: [facility];  Service: Cardiovascular;  Laterality: N/A;  . COLON SURGERY    . colorectal cancer     "scraped off"   . PROSTATE BIOPSY    . SHOULDER ARTHROSCOPY WITH BICEPS TENDON REPAIR Right 10/03/2016   Procedure: SHOULDER ARTHROSCOPIC LIMITED DEBRIDEMENT  WITH BICEPS TENOLYSIS;  Surgeon: Corky Mull, MD;  Location: [facility];  Service: Orthopedics;  Laterality: Right;  . Stomach ulcers      FAMILY HISTORY: Family History  Problem Relation Age of Onset  . Heart attack Mother   . Colon cancer Father   . Cancer Father        prostate  . Cancer Brother        prostate    ADVANCED DIRECTIVES (Y/N):  N  HEALTH MAINTENANCE: Social History   Tobacco Use  . Smoking status: Current Every Day Smoker    Packs/day: 0.50    Years: 45.00    Pack years: 22.50    Types: Cigarettes  . Smokeless tobacco: Never Used  Vaping Use  . Vaping Use: Never used  Substance Use Topics  . Alcohol  use: Yes    Comment: occasional glass of wine  . Drug use: Yes    Types: Marijuana     Colonoscopy:  PAP:  Bone density:  Lipid panel:  No Known Allergies  Current Outpatient Medications  Medication Sig Dispense Refill  . Cholecalciferol (D3-1000) 25 MCG (1000 UT) tablet Take 1,000 Units by mouth daily.    [name] docusate sodium (COLACE) 100 MG capsule Take 100 mg by mouth at bedtime.     . donepezil (ARICEPT) 10 MG tablet Take 10 mg by mouth at bedtime.     . gabapentin (NEURONTIN) 300 MG capsule Take 300  mg by mouth 3 (three) times daily.     . mirtazapine (REMERON) 7.5 MG tablet Take 7.5 mg by mouth at bedtime.     . naltrexone (DEPADE) 50 MG tablet Take 50 mg by mouth daily.    . tamsulosin (FLOMAX) 0.4 MG CAPS capsule Take 0.4 mg by mouth daily after supper.     [name] acetaminophen (TYLENOL) 500 MG tablet Take 500 mg by mouth every 6 (six) hours as needed for mild pain.  (Patient not taking: Reported on 03/25/2020)    . magic mouthwash SOLN Take 5-10 mLs by mouth 4 (four) times daily as needed for mouth pain. (Patient not taking: Reported on 03/25/2020) 480 mL 0  . ondansetron (ZOFRAN) 8 MG tablet Take 8 mg by mouth daily. (Patient not taking: Reported on 05/25/2020)     No current facility-administered medications for this visit.    OBJECTIVE: Vitals:   06/03/20 1001  BP: 104/66  Pulse: 70  Resp: 20  Temp: 97.6 F (36.4 C)  SpO2: 99%     Body mass index is 17.74 kg/m.    ECOG FS:0 - Asymptomatic  General: Thin, no acute distress. Eyes: Pink conjunctiva, anicteric sclera. HEENT: Normocephalic, moist mucous membranes.  No palpable lymphadenopathy. Lungs: No audible wheezing or coughing. Heart: Regular rate and rhythm. Abdomen: Soft, nontender, no obvious distention. Musculoskeletal: No edema, cyanosis, or clubbing. Neuro: Alert, answering all questions appropriately. Cranial nerves grossly intact. Skin: No rashes or petechiae noted. Psych: Normal affect.   LAB RESULTS:  Lab Results  Component Value Date   NA 136 05/05/2020   K 4.7 05/05/2020   CL 98 05/05/2020   CO2 31 05/05/2020   GLUCOSE 104 (H) 05/05/2020   BUN 7 (L) 05/05/2020   CREATININE 1.16 05/05/2020   CALCIUM 9.1 05/05/2020   PROT 6.9 03/25/2020   ALBUMIN 4.0 03/25/2020   AST 18 03/25/2020   ALT 12 03/25/2020   ALKPHOS 74 03/25/2020   BILITOT 0.6 03/25/2020   GFRNONAA >60 05/05/2020   GFRAA >60 05/05/2020    Lab Results  Component Value Date   WBC 8.6 05/25/2020   NEUTROABS 6.0 05/05/2020   HGB 14.2  05/25/2020   HCT 41.1 05/25/2020   MCV 90.9 05/25/2020   PLT 241 05/25/2020     STUDIES: NM PET Image Restag (PS) Skull Base To Thigh  Result Date: 05/05/2020 CLINICAL DATA:  Subsequent treatment strategy for squamous cell carcinoma of the right neck. EXAM: NUCLEAR MEDICINE PET SKULL BASE TO THIGH TECHNIQUE: 6.6 mCi F-18 FDG was injected intravenously. Full-ring PET imaging was performed from the skull base to thigh after the radiotracer. CT data was obtained and used for attenuation correction and anatomic localization. Fasting blood glucose: Eighty-nine mg/dl COMPARISON:  Multiple exams, including 10/20/2019 FINDINGS: Mediastinal blood pool activity: SUV max 1.8 Liver activity: SUV max NA NECK: Residual indistinct density in the  right level II nodal chain region measures about 1.1 cm in thickness on image 40/3 (previously 2.2 cm, maximum SUV 1.9 (formerly 14.1) no new adenopathy or new hypermetabolic activity is identified. Incidental CT findings: Subcutaneous cystic lesion along the right facial tissues is not hypermetabolic and measures 2.9 by 2.3 cm image 37/3, roughly stable from prior. Similar lesion posterior to the lower cervical spine in the left paracentral region, unchanged from prior. These likely represent sebaceous cysts or similar benign lesions. CHEST: No significant abnormal hypermetabolic activity in this region. Incidental CT findings: Coronary, aortic arch, and branch vessel atherosclerotic vascular disease. Centrilobular emphysema. Architectural distortion/sub solid nodule in the left upper lobe measuring 0.8 cm in diameter on image 104/3 without accentuated metabolic activity. ABDOMEN/PELVIS: Gastric activity reduced from prior and likely physiologic. Incidental CT findings: Aortoiliac atherosclerotic vascular disease. Dependent density in the gallbladder likely from sludge or small gallstones. Mild nonspecific increase in right perirenal stranding fiducials along the prostate gland.  Mildly increased presacral edema. SKELETON: There is a hypermetabolic permeative lesion of the left iliac bone with maximum SUV 12.7, and abnormal activity measuring approximately 4.3 by 1.7 by 5.3 cm. This is almost imperceptible on the CT data but there is some fine permeative cortical demineralization and subtle lucency in this vicinity appreciable on today's exam. Neither the abnormal lucency nor the abnormal activity was present on the prior PET-CT of 10/20/2019. Incidental CT findings: none IMPRESSION: 1. Marked reduction in size and activity of the previous conglomerate right level II nodal mass in the neck. Current maximum SUV is 1.9, about the same as blood pool, previous 14.1. 2. However, there is a new hypermetabolic lesion of the left iliac bone measuring up to 5.3 cm in long axis, with subtle lucency/permeative appearance but without visible extraosseous extension, highly suspicious for malignancy. 3. The 8 mm sub solid focus of architectural distortion in the right upper lobe remains without accentuated metabolic activity, and could be a small region of scarring or possibly low-grade adenocarcinoma, surveillance is recommended. 4. Mild nonspecific increase in subtle presacral edema and right greater than left perirenal stranding. 5. Other imaging findings of potential clinical significance: Aortic Atherosclerosis (ICD10-I70.0) and Emphysema (ICD10-J43.9). Sludge versus small gallstones in the gallbladder. Electronically Signed   By: Van Clines M.D.   On: 05/05/2020 14:36   CT BIOPSY  Result Date: 05/25/2020 CLINICAL DATA:  Right neck squamous cell carcinoma. Hypermetabolic lytic left iliac bone lesion on recent PET-CT EXAM: CT GUIDED DEEP BONE CORE BIOPSY OF LEFT ILIAC BONE LESION ANESTHESIA/SEDATION: Intravenous Fentanyl 33mcg and Versed 1mg  were administered as conscious sedation during continuous monitoring of the patient's level of consciousness and physiological / cardiorespiratory  status by the radiology RN, with a total moderate sedation time of 20 minutes. PROCEDURE: The procedure risks, benefits, and alternatives were explained to the patient. Questions regarding the procedure were encouraged and answered. The patient understands and consents to the procedure. Patient placed prone. Select axial scans through the pelvis were obtained. The left iliac lesion was localized and an appropriate skin entry site was determined and marked. The operative field was prepped with chlorhexidinein a sterile fashion, and a sterile drape was applied covering the operative field. A sterile gown and sterile gloves were used for the procedure. Local anesthesia was provided with 1% Lidocaine. Under intermittent CT fluoroscopic guidance, a 11 gauge cook bone needle advanced towards the lesion in the left iliac wing. Coaxial core samples obtained. A final core sample was obtained through the guide needle  itself, which was removed. Postprocedure scans show no fracture or other apparent complication. The patient tolerated the procedure well. COMPLICATIONS: None immediate FINDINGS: Lytic left iliac bone lesion was localized. Representative core biopsy samples obtained as above. IMPRESSION: Technically successful CT-guided core biopsy, left iliac bone lesion. Electronically Signed   By: Lucrezia Europe M.D.   On: 05/25/2020 14:54    ASSESSMENT: Stage IVa squamous cell carcinoma of head and neck.  PLAN:    1.  Stage IVa squamous cell carcinoma of head and neck: Cervical lymph node biopsy results from October 08, 2019 consistent with squamous cell carcinoma.  Patient completed weekly cisplatin on January 02, 2020 and then completed daily XRT on January 08, 2020.  PET scan results from May 05, 2020 reviewed independently with resolution of disease in patient's neck, however there is a new hypermetabolic lesion in the left iliac bone measuring 5.3 cm.  Biopsy of this area consistent with patient's original primary.  PET  scan reveals no other evidence of disease.  Patient was discussed at tumor board and recommendation is to pursue radiation only for this lesion.  Patient will follow up at the conclusion of radiation for further evaluation.  We will repeat PET scan 3 months after XRT to ensure resolution of malignancy.  2.  Shoulder pain: Patient does not complain of this today.  Monitor.  Continue Tylenol as needed. 3.  Dysphagia: Patient does not complain of this today.  Previously he was given a referral back to ENT. 4.  Poor appetite: Patient does not complain of this today, but will consider dietary referral in the near future.  I spent a total of 30 minutes reviewing chart data, face-to-face evaluation with the patient, counseling and coordination of care as detailed above.    Patient expressed understanding and was in agreement with this plan. He also understands that He can call clinic at any time with any questions, concerns, or complaints.   Cancer Staging Squamous cell carcinoma of head and neck (HCC) Staging form: Cervical Lymph Nodes and Unknown Primary Tumors of the Head and Neck, AJCC 8th Edition - Clinical stage from 11/07/2019: Stage IVA (cT0, cN2a, cM0) - Signed by Lloyd Huger, MD on 11/07/2019   Lloyd Huger, MD   06/04/2020 9:43 AM

## 2020-06-02 ENCOUNTER — Encounter: Payer: Self-pay | Admitting: Oncology

## 2020-06-02 NOTE — Progress Notes (Signed)
Patient's called for pre assessment. Patient's care taker [name] reports patient has been falling more and patient has been dizzy. She also reports patient has not been eating hardly anything and has complained of trouble swallowing. Reports he complains of nausea and just not feeling well. Denies patient is having pain. Denies of any questions at this time. She reports he is currently staying with brother and he will bring him to appointment tomorrow.

## 2020-06-03 ENCOUNTER — Other Ambulatory Visit: Payer: Self-pay | Admitting: *Deleted

## 2020-06-03 ENCOUNTER — Other Ambulatory Visit: Payer: Self-pay

## 2020-06-03 ENCOUNTER — Inpatient Hospital Stay: Payer: Medicare HMO | Attending: Oncology | Admitting: Oncology

## 2020-06-03 ENCOUNTER — Other Ambulatory Visit: Payer: Medicare HMO

## 2020-06-03 ENCOUNTER — Encounter: Payer: Self-pay | Admitting: Oncology

## 2020-06-03 VITALS — BP 104/66 | HR 70 | Temp 97.6°F | Resp 20 | Ht 69.0 in | Wt 120.1 lb

## 2020-06-03 DIAGNOSIS — R1310 Dysphagia, unspecified: Secondary | ICD-10-CM | POA: Diagnosis not present

## 2020-06-03 DIAGNOSIS — Z8249 Family history of ischemic heart disease and other diseases of the circulatory system: Secondary | ICD-10-CM | POA: Insufficient documentation

## 2020-06-03 DIAGNOSIS — C4442 Squamous cell carcinoma of skin of scalp and neck: Secondary | ICD-10-CM

## 2020-06-03 DIAGNOSIS — M25519 Pain in unspecified shoulder: Secondary | ICD-10-CM | POA: Insufficient documentation

## 2020-06-03 DIAGNOSIS — Z79899 Other long term (current) drug therapy: Secondary | ICD-10-CM | POA: Insufficient documentation

## 2020-06-03 DIAGNOSIS — R5383 Other fatigue: Secondary | ICD-10-CM | POA: Insufficient documentation

## 2020-06-03 DIAGNOSIS — F17210 Nicotine dependence, cigarettes, uncomplicated: Secondary | ICD-10-CM | POA: Diagnosis not present

## 2020-06-03 DIAGNOSIS — R531 Weakness: Secondary | ICD-10-CM | POA: Diagnosis not present

## 2020-06-03 DIAGNOSIS — Z800 Family history of malignant neoplasm of digestive organs: Secondary | ICD-10-CM | POA: Insufficient documentation

## 2020-06-03 DIAGNOSIS — C760 Malignant neoplasm of head, face and neck: Secondary | ICD-10-CM | POA: Insufficient documentation

## 2020-06-03 DIAGNOSIS — Z681 Body mass index (BMI) 19 or less, adult: Secondary | ICD-10-CM | POA: Diagnosis not present

## 2020-06-03 DIAGNOSIS — I700 Atherosclerosis of aorta: Secondary | ICD-10-CM | POA: Insufficient documentation

## 2020-06-03 DIAGNOSIS — R911 Solitary pulmonary nodule: Secondary | ICD-10-CM | POA: Diagnosis not present

## 2020-06-03 DIAGNOSIS — J439 Emphysema, unspecified: Secondary | ICD-10-CM | POA: Insufficient documentation

## 2020-06-03 DIAGNOSIS — Z8042 Family history of malignant neoplasm of prostate: Secondary | ICD-10-CM | POA: Diagnosis not present

## 2020-06-03 DIAGNOSIS — Z8546 Personal history of malignant neoplasm of prostate: Secondary | ICD-10-CM | POA: Insufficient documentation

## 2020-06-03 NOTE — Progress Notes (Signed)
Patient and brother here today for follow up. Brother reports patient's nausea they believe was coming from morning coffee. Patient has stopped coffee and since nausea has gotten better. He also reports concerns about patient's appetite and not eating. Brother states when patient stays with him, he eats well but is not sure how patient eats at rest home.  Patient denies any pain or trouble with swallowing today.

## 2020-06-04 NOTE — Progress Notes (Addendum)
Tumor Board Documentation  DRESHAWN HENDERSHOTT was presented by Dr Grayland Ormond at our Tumor Board on 06/03/2020, which included representatives from medical oncology, radiation oncology, surgical oncology, internal medicine, navigation, pathology, radiology, surgical, pharmacy, research, palliative care.  Rahmel currently presents as a current patient, for [place], for new positive pathology with history of the following treatments: neoadjuvant chemoradiation, active survellience.  Additionally, we reviewed previous medical and familial history, history of present illness, and recent lab results along with all available histopathologic and imaging studies. The tumor board considered available treatment options and made the following recommendations: Radiation therapy (primary modality), Immunotherapy (Possible Immunotherapy)    The following procedures/referrals were also placed: No orders of the defined types were placed in this encounter.   Clinical Trial Status: not discussed   Staging used: Clinical Stage  AJCC Staging: T: cT0 N: 2 M:1 Group: Stage IVa Squamouscell carcinoma of Head and neck with Bone mets   National site-specific guidelines NCCN were discussed with respect to the case.  Tumor board is a meeting of clinicians from various specialty areas who evaluate and discuss patients for whom a multidisciplinary approach is being considered. Final determinations in the plan of care are those of the provider(s). The responsibility for follow up of recommendations given during tumor board is that of the provider.   Today's extended care, comprehensive team conference, Mekhi was not present for the discussion and was not examined.   Multidisciplinary Tumor Board is a multidisciplinary case peer review process.  Decisions discussed in the Multidisciplinary Tumor Board reflect the opinions of the specialists present at the conference without having examined the patient.  Ultimately, treatment and  diagnostic decisions rest with the primary provider(s) and the patient.

## 2020-06-10 ENCOUNTER — Ambulatory Visit
Admission: RE | Admit: 2020-06-10 | Discharge: 2020-06-10 | Disposition: A | Discharge status: Home / Self Care | Payer: Medicare HMO | Source: Ambulatory Visit | Attending: Radiation Oncology | Admitting: Radiation Oncology

## 2020-06-10 ENCOUNTER — Other Ambulatory Visit: Payer: Self-pay

## 2020-06-10 DIAGNOSIS — C760 Malignant neoplasm of head, face and neck: Secondary | ICD-10-CM | POA: Insufficient documentation

## 2020-06-10 LAB — POCT I-STAT CREATININE: Creatinine, Ser: 1 mg/dL (ref 0.61–1.24)

## 2020-06-10 MED ORDER — IOHEXOL 300 MG/ML  SOLN
75.0000 mL | Freq: Once | INTRAMUSCULAR | Status: AC | PRN
  Administered 2020-06-10: 75 mL via INTRAVENOUS

## 2020-06-16 ENCOUNTER — Other Ambulatory Visit: Payer: Self-pay

## 2020-06-16 ENCOUNTER — Ambulatory Visit
Admission: RE | Admit: 2020-06-16 | Discharge: 2020-06-16 | Disposition: A | Discharge status: Home / Self Care | Payer: Medicare HMO | Source: Ambulatory Visit | Attending: Radiation Oncology | Admitting: Radiation Oncology

## 2020-06-16 ENCOUNTER — Encounter: Payer: Self-pay | Admitting: Radiation Oncology

## 2020-06-16 VITALS — BP 95/66 | HR 76 | Temp 95.0°F | Wt 117.0 lb

## 2020-06-16 DIAGNOSIS — R1310 Dysphagia, unspecified: Secondary | ICD-10-CM | POA: Insufficient documentation

## 2020-06-16 DIAGNOSIS — C7951 Secondary malignant neoplasm of bone: Secondary | ICD-10-CM | POA: Diagnosis present

## 2020-06-16 DIAGNOSIS — C01 Malignant neoplasm of base of tongue: Secondary | ICD-10-CM | POA: Diagnosis not present

## 2020-06-16 DIAGNOSIS — C760 Malignant neoplasm of head, face and neck: Secondary | ICD-10-CM

## 2020-06-16 NOTE — Progress Notes (Signed)
Radiation Oncology Follow up Note (OP N/A) solitary metastasis from head and neck cancer  Name: Zachary Gilmore   Date:   06/16/2020 MRN:  [id number] DOB: 12-Jan-1956    This 64 y.o. male presents to the clinic today for evaluation of left iliac bone solitary metastasis and patient with known prior locally advanced head and neck cancer squamous cell carcinoma the base of tongue.  REFERRING PROVIDER: Carlos American, FNP  HPI: Patient is a 64 year old male now out 5 months having completed concurrent chemoradiation therapy for locally advanced squamous cell carcinoma the base of tongue.  He initially had a 4.6 cm mass in the right neck consistent with metastatic squamous cell carcinoma.[name]  He is slowly improving and recent PET CT scan back in August showed marked reduction in size and activity of previous conglomerate right level 2 nodal mass with SUV of only 1.9 cm his blood pool.  There was however a new hypermetabolic lesion in left iliac bone measuring up to 5.3 cm in long axis.  He is having some pain on ambulation.  He is now referred to radiation oncology for consideration of treatment after presentation of his findings at tumor board.  Patient is still having some mild dysphagia no significant head and neck pain.  COMPLICATIONS OF TREATMENT: none  FOLLOW UP COMPLIANCE: keeps appointments   PHYSICAL EXAM:  BP 95/66   Pulse 76   Temp (!) 95 F (35 C) (Tympanic)   Wt 117 lb (53.1 kg)   BMI 17.28 kg/m  Well-developed well-nourished patient in NAD. HEENT reveals PERLA, EOMI, discs not visualized.  Oral cavity is clear. No oral mucosal lesions are identified. Neck is clear without evidence of cervical or supraclavicular adenopathy. Lungs are clear to A&P. Cardiac examination is essentially unremarkable with regular rate and rhythm without murmur rub or thrill. Abdomen is benign with no organomegaly or masses noted. Motor sensory and DTR levels are equal and symmetric in the upper and lower  extremities. Cranial nerves II through XII are grossly intact. Proprioception is intact. No peripheral adenopathy or edema is identified. No motor or sensory levels are noted. Crude visual fields are within normal range.  RADIOLOGY RESULTS: PET CT scans reviewed compatible with above-stated findings  PLAN: At this time elect to go ahead with SBRT to his solitary bone metastasis.  Would plan on delivering 30 Gy in 5 fractions.  I would use PET fusion study for treatment planning purposes.  Risks and benefits of treatment including possible fatigue possible skin reaction possible slight chance of diarrhea all were explained in detail to the patient.  I have personally set up and ordered CT simulation for early next week.  Patient comprehends my treatment plan well.  I would like to take this opportunity to thank you for allowing me to participate in the care of your patient.Noreene Filbert, MD

## 2020-06-21 ENCOUNTER — Ambulatory Visit
Admission: RE | Admit: 2020-06-21 | Discharge: 2020-06-21 | Disposition: A | Discharge status: Home / Self Care | Payer: Medicare HMO | Source: Ambulatory Visit | Attending: Radiation Oncology | Admitting: Radiation Oncology

## 2020-06-21 DIAGNOSIS — C01 Malignant neoplasm of base of tongue: Secondary | ICD-10-CM | POA: Insufficient documentation

## 2020-06-21 DIAGNOSIS — Z510 Encounter for antineoplastic radiation therapy: Secondary | ICD-10-CM | POA: Diagnosis not present

## 2020-06-23 DIAGNOSIS — Z510 Encounter for antineoplastic radiation therapy: Secondary | ICD-10-CM | POA: Diagnosis not present

## 2020-06-30 ENCOUNTER — Ambulatory Visit
Admission: RE | Admit: 2020-06-30 | Discharge: 2020-06-30 | Disposition: A | Discharge status: Home / Self Care | Payer: Medicare HMO | Source: Ambulatory Visit | Attending: Radiation Oncology | Admitting: Radiation Oncology

## 2020-06-30 DIAGNOSIS — Z510 Encounter for antineoplastic radiation therapy: Secondary | ICD-10-CM | POA: Diagnosis not present

## 2020-07-02 ENCOUNTER — Ambulatory Visit
Admission: RE | Admit: 2020-07-02 | Discharge: 2020-07-02 | Disposition: A | Discharge status: Home / Self Care | Payer: Medicare HMO | Source: Ambulatory Visit | Attending: Radiation Oncology | Admitting: Radiation Oncology

## 2020-07-02 DIAGNOSIS — Z510 Encounter for antineoplastic radiation therapy: Secondary | ICD-10-CM | POA: Diagnosis not present

## 2020-07-04 ENCOUNTER — Ambulatory Visit: Admission: RE | Admit: 2020-07-04 | Payer: Medicare HMO | Source: Ambulatory Visit

## 2020-07-05 ENCOUNTER — Ambulatory Visit: Payer: Medicare HMO

## 2020-07-05 ENCOUNTER — Ambulatory Visit
Admission: RE | Admit: 2020-07-05 | Discharge: 2020-07-05 | Disposition: A | Discharge status: Home / Self Care | Payer: Medicare HMO | Source: Ambulatory Visit | Attending: Radiation Oncology | Admitting: Radiation Oncology

## 2020-07-05 DIAGNOSIS — Z510 Encounter for antineoplastic radiation therapy: Secondary | ICD-10-CM | POA: Diagnosis not present

## 2020-07-07 ENCOUNTER — Ambulatory Visit
Admission: RE | Admit: 2020-07-07 | Discharge: 2020-07-07 | Disposition: A | Discharge status: Home / Self Care | Payer: Medicare HMO | Source: Ambulatory Visit | Attending: Radiation Oncology | Admitting: Radiation Oncology

## 2020-07-07 DIAGNOSIS — Z510 Encounter for antineoplastic radiation therapy: Secondary | ICD-10-CM | POA: Diagnosis not present

## 2020-07-09 ENCOUNTER — Ambulatory Visit: Payer: Medicare HMO

## 2020-07-09 DIAGNOSIS — R972 Elevated prostate specific antigen [PSA]: Secondary | ICD-10-CM | POA: Diagnosis not present

## 2020-07-09 DIAGNOSIS — B182 Chronic viral hepatitis C: Secondary | ICD-10-CM | POA: Diagnosis not present

## 2020-07-09 DIAGNOSIS — F1010 Alcohol abuse, uncomplicated: Secondary | ICD-10-CM | POA: Diagnosis not present

## 2020-07-09 DIAGNOSIS — Z79899 Other long term (current) drug therapy: Secondary | ICD-10-CM | POA: Diagnosis not present

## 2020-07-09 DIAGNOSIS — F1027 Alcohol dependence with alcohol-induced persisting dementia: Secondary | ICD-10-CM | POA: Diagnosis not present

## 2020-07-09 DIAGNOSIS — K219 Gastro-esophageal reflux disease without esophagitis: Secondary | ICD-10-CM | POA: Diagnosis not present

## 2020-07-10 NOTE — Progress Notes (Deleted)
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  He is accompanied by his brother today.  He continues to have weakness and fatigue, but otherwise feels well.  He denies any pain.  He denies any dysphagia.  He has no neurologic complaints.  He denies any recent fevers or illnesses.  He has no chest pain, shortness of breath, cough, or hemoptysis.  He denies any nausea, vomiting, constipation, or diarrhea.  He has no urinary complaints.  Patient offers no further specific complaints today.  REVIEW OF SYSTEMS:   Review of Systems  Constitutional: Positive for malaise/fatigue. Negative for fever and weight loss.  HENT: Negative for sore throat.   Respiratory: Negative.  Negative for cough and shortness of breath.   Cardiovascular: Negative.  Negative for chest pain and leg swelling.  Gastrointestinal: Negative.  Negative for abdominal pain, blood in stool and melena.  Genitourinary: Negative.  Negative for dysuria.  Musculoskeletal: Negative.  Negative for back pain, joint pain and neck pain.  Skin: Negative.  Negative for rash.  Neurological: Positive for weakness. Negative for dizziness, focal weakness and headaches.  Psychiatric/Behavioral: Negative.  The patient is not nervous/anxious.     As per HPI. Otherwise, a complete review of systems is negative.  PAST MEDICAL HISTORY: Past Medical History:  Diagnosis Date  .  Alcohol abuse   . Colorectal cancer ([place])   . GERD (gastroesophageal reflux disease)   . Hepatitis   . History of substance abuse ([place])   . Neuropathy   . Osteoarthritis   . Prostate cancer ([place])   . Seizures ([place])    as a child  . Shoulder pain   . Tobacco use     PAST SURGICAL HISTORY: Past Surgical History:  Procedure Laterality Date  . CARDIAC CATHETERIZATION N/A 10/03/2016   Procedure: Left Heart Cath and Coronary Angiography;  Surgeon: Corey Skains, MD;  Location: [facility];  Service: Cardiovascular;  Laterality: N/A;  . COLON SURGERY    . colorectal cancer     "scraped off"   . PROSTATE BIOPSY    . SHOULDER ARTHROSCOPY WITH BICEPS TENDON REPAIR Right 10/03/2016   Procedure: SHOULDER ARTHROSCOPIC LIMITED DEBRIDEMENT  WITH BICEPS TENOLYSIS;  Surgeon: Corky Mull, MD;  Location: [facility];  Service: Orthopedics;  Laterality: Right;  . Stomach ulcers      FAMILY HISTORY: Family History  Problem Relation Age of Onset  . Heart attack Mother   . Colon cancer Father   . Cancer Father        prostate  . Cancer Brother        prostate    ADVANCED DIRECTIVES (Y/N):  N  HEALTH MAINTENANCE: Social History   Tobacco Use  . Smoking status: Current Every Day Smoker    Packs/day: 0.50    Years: 45.00    Pack years: 22.50    Types: Cigarettes  . Smokeless tobacco: Never Used  Vaping Use  . Vaping Use: Never used  Substance Use Topics  . Alcohol  use: Yes    Comment: occasional glass of wine  . Drug use: Yes    Types: Marijuana     Colonoscopy:  PAP:  Bone density:  Lipid panel:  No Known Allergies  Current Outpatient Medications  Medication Sig Dispense Refill  . acetaminophen (TYLENOL) 500 MG tablet Take 500 mg by mouth every 6 (six) hours as needed for mild pain.  (Patient not taking: Reported on 03/25/2020)    . Cholecalciferol (D3-1000) 25 MCG (1000 UT) tablet Take 1,000 Units by mouth daily.    [name] docusate sodium (COLACE) 100 MG capsule Take  100 mg by mouth at bedtime.     . donepezil (ARICEPT) 10 MG tablet Take 10 mg by mouth at bedtime.     . gabapentin (NEURONTIN) 300 MG capsule Take 300 mg by mouth 3 (three) times daily.     . magic mouthwash SOLN Take 5-10 mLs by mouth 4 (four) times daily as needed for mouth pain. (Patient not taking: Reported on 03/25/2020) 480 mL 0  . mirtazapine (REMERON) 7.5 MG tablet Take 7.5 mg by mouth at bedtime.     . naltrexone (DEPADE) 50 MG tablet Take 50 mg by mouth daily.    . ondansetron (ZOFRAN) 8 MG tablet Take 8 mg by mouth daily. (Patient not taking: Reported on 05/25/2020)    . tamsulosin (FLOMAX) 0.4 MG CAPS capsule Take 0.4 mg by mouth daily after supper.      No current facility-administered medications for this visit.    OBJECTIVE: There were no vitals filed for this visit.   There is no height or weight on file to calculate BMI.    ECOG FS:0 - Asymptomatic  General: Thin, no acute distress. Eyes: Pink conjunctiva, anicteric sclera. HEENT: Normocephalic, moist mucous membranes.  No palpable lymphadenopathy. Lungs: No audible wheezing or coughing. Heart: Regular rate and rhythm. Abdomen: Soft, nontender, no obvious distention. Musculoskeletal: No edema, cyanosis, or clubbing. Neuro: Alert, answering all questions appropriately. Cranial nerves grossly intact. Skin: No rashes or petechiae noted. Psych: Normal affect.   LAB RESULTS:  Lab Results  Component Value Date   NA 136 05/05/2020   K 4.7 05/05/2020   CL 98 05/05/2020   CO2 31 05/05/2020   GLUCOSE 104 (H) 05/05/2020   BUN 7 (L) 05/05/2020   CREATININE 1.00 06/10/2020   CALCIUM 9.1 05/05/2020   PROT 6.9 03/25/2020   ALBUMIN 4.0 03/25/2020   AST 18 03/25/2020   ALT 12 03/25/2020   ALKPHOS 74 03/25/2020   BILITOT 0.6 03/25/2020   GFRNONAA >60 05/05/2020   GFRAA >60 05/05/2020    Lab Results  Component Value Date   WBC 8.6 05/25/2020   NEUTROABS 6.0 05/05/2020   HGB 14.2 05/25/2020   HCT 41.1 05/25/2020    MCV 90.9 05/25/2020   PLT 241 05/25/2020     STUDIES: CT Soft Tissue Neck W Contrast  Result Date: 06/10/2020 CLINICAL DATA:  Squamous cell carcinoma head neck. Radiation and chemotherapy. EXAM: CT NECK WITH CONTRAST TECHNIQUE: Multidetector CT imaging of the neck was performed using the standard protocol following the bolus administration of intravenous contrast. CONTRAST:  16mL OMNIPAQUE IOHEXOL 300 MG/ML  SOLN COMPARISON:  CT neck 09/11/2019 FINDINGS: Pharynx and larynx: Edema in the epiglottis and larynx due to radiation. No airway compromise. No mass lesion in the pharynx. Salivary glands: Radiation change in the parotid and submandibular glands bilaterally with decreased size and hyperenhancement compared with the prior study. Thyroid: Negative Lymph nodes: Large lymph node mass  in the right neck has resolved. No lymphadenopathy is present currently. Vascular: Normal vascular enhancement. Limited intracranial: Negative Visualized orbits: Negative Mastoids and visualized paranasal sinuses: Mild mucosal edema paranasal sinuses. Mastoid clear bilaterally. Skeleton: No acute abnormality. Large anterior osteophytes C4-5 and C5-6 with anterior displacement of the larynx. Upper chest: Mild apical emphysema without acute abnormality. Other: Large cyst in the right cheek dermis measures 32 x 23 mm unchanged from the prior study. This appears benign. 14 mm dermal cyst in the upper back soft tissues in the midline also unchanged. IMPRESSION: Post radiation changes with mucosal edema in the larynx. Post radiation changes in the salivary glands. Previously noted large lymph node mass in the right neck has resolved. No residual adenopathy. Electronically Signed   By: Franchot Gallo M.D.   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  PLAN:    1.  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  Patient completed weekly cisplatin on January 02, 2020 and then completed daily XRT on January 08, 2020.  PET scan results from May 05, 2020 reviewed independently with resolution of disease in patient's neck, however there is a new hypermetabolic lesion in the left iliac bone measuring 5.3 cm.  Biopsy of this area consistent with patient's original primary.  PET scan reveals no other evidence of disease.  Patient was discussed at tumor board and recommendation is to pursue radiation only for this lesion.  Patient will follow up at the conclusion of radiation for further evaluation.  We will repeat PET scan 3 months after XRT to ensure resolution of malignancy.  2.  Shoulder pain: Patient does not complain of this today.  Monitor.  Continue Tylenol as needed. 3.  Dysphagia: Patient does not complain of this today.  Previously he was given a referral back to ENT. 4.  Poor appetite: Patient does not complain of this today, but will consider dietary referral in the near future.  I spent a total of 30 minutes reviewing chart data, face-to-face evaluation with the patient, counseling and coordination of care as detailed above.    Patient expressed understanding and was in agreement with this plan. He also understands that He can call clinic at any time with any questions, concerns, or complaints.   Cancer Staging Squamous cell carcinoma of head and neck (HCC) Staging form: Cervical Lymph Nodes and Unknown Primary Tumors of the Head and Neck, AJCC 8th Edition - Clinical stage from 11/07/2019: Stage IVA (cT0, cN2a, cM0) - Signed by Lloyd Huger, MD on 11/07/2019   Lloyd Huger, MD   07/10/2020 9:29 AM

## 2020-07-12 ENCOUNTER — Other Ambulatory Visit: Payer: Self-pay

## 2020-07-12 ENCOUNTER — Inpatient Hospital Stay: Payer: Medicare HMO | Admitting: Oncology

## 2020-07-12 ENCOUNTER — Inpatient Hospital Stay: Payer: Medicare HMO | Attending: Oncology | Admitting: Oncology

## 2020-07-12 ENCOUNTER — Encounter: Payer: Self-pay | Admitting: Oncology

## 2020-07-12 ENCOUNTER — Ambulatory Visit
Admission: RE | Admit: 2020-07-12 | Discharge: 2020-07-12 | Disposition: A | Discharge status: Home / Self Care | Payer: Medicare HMO | Source: Ambulatory Visit | Attending: Radiation Oncology | Admitting: Radiation Oncology

## 2020-07-12 VITALS — BP 93/75 | HR 87 | Temp 97.9°F | Wt 117.2 lb

## 2020-07-12 DIAGNOSIS — Z8546 Personal history of malignant neoplasm of prostate: Secondary | ICD-10-CM | POA: Diagnosis not present

## 2020-07-12 DIAGNOSIS — Z800 Family history of malignant neoplasm of digestive organs: Secondary | ICD-10-CM | POA: Insufficient documentation

## 2020-07-12 DIAGNOSIS — R5383 Other fatigue: Secondary | ICD-10-CM | POA: Insufficient documentation

## 2020-07-12 DIAGNOSIS — R531 Weakness: Secondary | ICD-10-CM | POA: Diagnosis not present

## 2020-07-12 DIAGNOSIS — Z923 Personal history of irradiation: Secondary | ICD-10-CM | POA: Insufficient documentation

## 2020-07-12 DIAGNOSIS — Z8249 Family history of ischemic heart disease and other diseases of the circulatory system: Secondary | ICD-10-CM | POA: Diagnosis not present

## 2020-07-12 DIAGNOSIS — F17210 Nicotine dependence, cigarettes, uncomplicated: Secondary | ICD-10-CM | POA: Insufficient documentation

## 2020-07-12 DIAGNOSIS — C01 Malignant neoplasm of base of tongue: Secondary | ICD-10-CM | POA: Diagnosis present

## 2020-07-12 DIAGNOSIS — Z79899 Other long term (current) drug therapy: Secondary | ICD-10-CM | POA: Diagnosis not present

## 2020-07-12 DIAGNOSIS — R1310 Dysphagia, unspecified: Secondary | ICD-10-CM | POA: Insufficient documentation

## 2020-07-12 DIAGNOSIS — K5900 Constipation, unspecified: Secondary | ICD-10-CM | POA: Diagnosis not present

## 2020-07-12 DIAGNOSIS — M25519 Pain in unspecified shoulder: Secondary | ICD-10-CM | POA: Insufficient documentation

## 2020-07-12 DIAGNOSIS — C760 Malignant neoplasm of head, face and neck: Secondary | ICD-10-CM

## 2020-07-12 DIAGNOSIS — Z8042 Family history of malignant neoplasm of prostate: Secondary | ICD-10-CM | POA: Diagnosis not present

## 2020-07-12 DIAGNOSIS — Z510 Encounter for antineoplastic radiation therapy: Secondary | ICD-10-CM | POA: Insufficient documentation

## 2020-07-12 NOTE — Progress Notes (Signed)
Patient reports being very weak and having a lot of constipation. States colace doesn't seem to working.

## 2020-07-12 NOTE — Progress Notes (Signed)
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  He complains of increased weakness and fatigue as well as constipation, but otherwise feels well. He denies any pain.  He denies any dysphagia.  He has no neurologic complaints.  He denies any recent fevers or illnesses.  He has no chest pain, shortness of breath, cough, or hemoptysis.  He denies any nausea, vomiting, or diarrhea.  He has no urinary complaints.  Patient offers no further specific complaints today.  REVIEW OF SYSTEMS:   Review of Systems  Constitutional: Positive for malaise/fatigue. Negative for fever and weight loss.  HENT: Negative for sore throat.   Respiratory: Negative.  Negative for cough and shortness of breath.   Cardiovascular: Negative.  Negative for chest pain and leg swelling.  Gastrointestinal: Positive for constipation. Negative for abdominal pain, blood in stool and melena.  Genitourinary: Negative.  Negative for dysuria.  Musculoskeletal: Negative.  Negative for back pain, joint pain and neck pain.  Skin: Negative.  Negative for rash.  Neurological: Positive for weakness. Negative for dizziness, focal weakness and headaches.  Psychiatric/Behavioral: Negative.  The patient is not nervous/anxious.     As per HPI. Otherwise, a complete review of systems is negative.  PAST MEDICAL HISTORY: Past Medical History:  Diagnosis Date  . Alcohol abuse   .  Colorectal cancer ([place])   . GERD (gastroesophageal reflux disease)   . Hepatitis   . History of substance abuse ([place])   . Neuropathy   . Osteoarthritis   . Prostate cancer ([place])   . Seizures ([place])    as a child  . Shoulder pain   . Tobacco use     PAST SURGICAL HISTORY: Past Surgical History:  Procedure Laterality Date  . CARDIAC CATHETERIZATION N/A 10/03/2016   Procedure: Left Heart Cath and Coronary Angiography;  Surgeon: Corey Skains, MD;  Location: [facility];  Service: Cardiovascular;  Laterality: N/A;  . COLON SURGERY    . colorectal cancer     "scraped off"   . PROSTATE BIOPSY    . SHOULDER ARTHROSCOPY WITH BICEPS TENDON REPAIR Right 10/03/2016   Procedure: SHOULDER ARTHROSCOPIC LIMITED DEBRIDEMENT  WITH BICEPS TENOLYSIS;  Surgeon: Corky Mull, MD;  Location: [facility];  Service: Orthopedics;  Laterality: Right;  . Stomach ulcers      FAMILY HISTORY: Family History  Problem Relation Age of Onset  . Heart attack Mother   . Colon cancer Father   . Cancer Father        prostate  . Cancer Brother        prostate    ADVANCED DIRECTIVES (Y/N):  N  HEALTH MAINTENANCE: Social History   Tobacco Use  . Smoking status: Current Every Day Smoker    Packs/day: 0.50    Years: 45.00    Pack years: 22.50    Types: Cigarettes  . Smokeless tobacco: Never Used  Vaping Use  . Vaping Use: Never used  Substance Use Topics  . Alcohol use: Yes  Comment: occasional glass of wine  . Drug use: Yes    Types: Marijuana     Colonoscopy:  PAP:  Bone density:  Lipid panel:  No Known Allergies  Current Outpatient Medications  Medication Sig Dispense Refill  . Cholecalciferol (D3-1000) 25 MCG (1000 UT) tablet Take 1,000 Units by mouth daily.    [name] docusate sodium (COLACE) 100 MG capsule Take 100 mg by mouth at bedtime.     . donepezil (ARICEPT) 10 MG tablet Take 10 mg by mouth at bedtime.     . gabapentin (NEURONTIN) 300 MG capsule Take 300 mg by mouth 3  (three) times daily.     . mirtazapine (REMERON) 7.5 MG tablet Take 7.5 mg by mouth at bedtime.     . naltrexone (DEPADE) 50 MG tablet Take 50 mg by mouth daily.    . tamsulosin (FLOMAX) 0.4 MG CAPS capsule Take 0.4 mg by mouth daily after supper.     [name] acetaminophen (TYLENOL) 500 MG tablet Take 500 mg by mouth every 6 (six) hours as needed for mild pain.  (Patient not taking: Reported on 03/25/2020)    . magic mouthwash SOLN Take 5-10 mLs by mouth 4 (four) times daily as needed for mouth pain. (Patient not taking: Reported on 03/25/2020) 480 mL 0  . ondansetron (ZOFRAN) 8 MG tablet Take 8 mg by mouth daily. (Patient not taking: Reported on 05/25/2020)     No current facility-administered medications for this visit.    OBJECTIVE: Vitals:   07/12/20 1024  BP: 93/75  Pulse: 87  Temp: 97.9 F (36.6 C)  SpO2: 100%     Body mass index is 17.31 kg/m.    ECOG FS:0 - Asymptomatic  General: Thin, no acute distress. Eyes: Pink conjunctiva, anicteric sclera. HEENT: Normocephalic, moist mucous membranes.  No palpable lymphadenopathy. Lungs: No audible wheezing or coughing. Heart: Regular rate and rhythm. Abdomen: Soft, nontender, no obvious distention. Musculoskeletal: No edema, cyanosis, or clubbing. Neuro: Alert, answering all questions appropriately. Cranial nerves grossly intact. Skin: No rashes or petechiae noted. Psych: Normal affect.   LAB RESULTS:  Lab Results  Component Value Date   NA 136 05/05/2020   K 4.7 05/05/2020   CL 98 05/05/2020   CO2 31 05/05/2020   GLUCOSE 104 (H) 05/05/2020   BUN 7 (L) 05/05/2020   CREATININE 1.00 06/10/2020   CALCIUM 9.1 05/05/2020   PROT 6.9 03/25/2020   ALBUMIN 4.0 03/25/2020   AST 18 03/25/2020   ALT 12 03/25/2020   ALKPHOS 74 03/25/2020   BILITOT 0.6 03/25/2020   GFRNONAA >60 05/05/2020   GFRAA >60 05/05/2020    Lab Results  Component Value Date   WBC 8.6 05/25/2020   NEUTROABS 6.0 05/05/2020   HGB 14.2 05/25/2020   HCT 41.1  05/25/2020   MCV 90.9 05/25/2020   PLT 241 05/25/2020     STUDIES: No results found.  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  PLAN:    1.  Stage IVa squamous cell carcinoma of head and neck: Cervical lymph node biopsy results from October 08, 2019 consistent with squamous cell carcinoma.  Patient completed weekly cisplatin on January 02, 2020 and then completed daily XRT on January 08, 2020.  PET scan results from May 05, 2020 reviewed independently with resolution of disease in patient's neck, however there is a new hypermetabolic lesion in the left iliac bone measuring 5.3 cm.  Biopsy of this area consistent with patient's original primary.  PET scan  reveals no other evidence of disease.  Patient was discussed at tumor board and recommendation is to pursue radiation only for this lesion.  Patient completed XRT today.  Return to clinic in 3 months with repeat imaging and further evaluation.   2.  Shoulder pain: Patient does not complain of this today.  Monitor.  Continue Tylenol as needed. 3.  Dysphagia: Patient does not complain of this today.  Previously he was given a referral back to ENT. 4.  Constipation: Recommended OTC MiraLAX.   Patient expressed understanding and was in agreement with this plan. He also understands that He can call clinic at any time with any questions, concerns, or complaints.   Cancer Staging Squamous cell carcinoma of head and neck ([place]) Staging form: Cervical Lymph Nodes and Unknown Primary Tumors of the Head and Neck, AJCC 8th Edition - Clinical stage from 11/07/2019: Stage IVA (cT0, cN2a, cM0) - Signed by Lloyd Huger, MD on 11/07/2019   Lloyd Huger, MD   07/12/2020 3:05 PM

## 2020-07-14 ENCOUNTER — Ambulatory Visit: Payer: Medicare HMO

## 2020-07-16 ENCOUNTER — Ambulatory Visit: Payer: Medicare HMO | Admitting: Oncology

## 2020-08-12 ENCOUNTER — Encounter (HOSPITAL_COMMUNITY): Payer: Self-pay

## 2020-08-12 ENCOUNTER — Other Ambulatory Visit: Payer: Self-pay

## 2020-08-12 ENCOUNTER — Emergency Department (HOSPITAL_COMMUNITY): Payer: Medicare HMO

## 2020-08-12 ENCOUNTER — Emergency Department (HOSPITAL_COMMUNITY)
Admission: EM | Admit: 2020-08-12 | Discharge: 2020-08-12 | Disposition: A | Discharge status: Home / Self Care | Payer: Medicare HMO | Attending: Emergency Medicine | Admitting: Emergency Medicine

## 2020-08-12 DIAGNOSIS — Z85818 Personal history of malignant neoplasm of other sites of lip, oral cavity, and pharynx: Secondary | ICD-10-CM | POA: Diagnosis not present

## 2020-08-12 DIAGNOSIS — Z79899 Other long term (current) drug therapy: Secondary | ICD-10-CM | POA: Insufficient documentation

## 2020-08-12 DIAGNOSIS — I25110 Atherosclerotic heart disease of native coronary artery with unstable angina pectoris: Secondary | ICD-10-CM | POA: Insufficient documentation

## 2020-08-12 DIAGNOSIS — M545 Low back pain, unspecified: Secondary | ICD-10-CM | POA: Diagnosis present

## 2020-08-12 DIAGNOSIS — Z85828 Personal history of other malignant neoplasm of skin: Secondary | ICD-10-CM | POA: Insufficient documentation

## 2020-08-12 DIAGNOSIS — Z85038 Personal history of other malignant neoplasm of large intestine: Secondary | ICD-10-CM | POA: Insufficient documentation

## 2020-08-12 DIAGNOSIS — M47816 Spondylosis without myelopathy or radiculopathy, lumbar region: Secondary | ICD-10-CM | POA: Insufficient documentation

## 2020-08-12 DIAGNOSIS — Z8546 Personal history of malignant neoplasm of prostate: Secondary | ICD-10-CM | POA: Diagnosis not present

## 2020-08-12 DIAGNOSIS — F17210 Nicotine dependence, cigarettes, uncomplicated: Secondary | ICD-10-CM | POA: Insufficient documentation

## 2020-08-12 LAB — URINALYSIS, ROUTINE W REFLEX MICROSCOPIC
Bilirubin Urine: NEGATIVE
Glucose, UA: NEGATIVE mg/dL
Hgb urine dipstick: NEGATIVE
Ketones, ur: NEGATIVE mg/dL
Leukocytes,Ua: NEGATIVE
Nitrite: NEGATIVE
Protein, ur: NEGATIVE mg/dL
Specific Gravity, Urine: 1.003 — ABNORMAL LOW (ref 1.005–1.030)
pH: 7 (ref 5.0–8.0)

## 2020-08-12 MED ORDER — ACETAMINOPHEN 325 MG PO TABS
650.0000 mg | ORAL_TABLET | Freq: Once | ORAL | Status: AC
  Administered 2020-08-12: 650 mg via ORAL
  Filled 2020-08-12: qty 2

## 2020-08-12 NOTE — ED Provider Notes (Signed)
[facility] Provider Note   CSN: [id number] Arrival date & time: 08/12/20  1402     History Chief Complaint  Patient presents with  . Hip Pain    Zachary Gilmore is a 64 y.o. male.  HPI He presents for evaluation of lower back pain present for several weeks, without known trauma.  He states he has had cancer in his back before, that spread from his neck.  He is not currently receiving cancer and states that he was cured with the prior treatment.  He denies radicular pain in the legs.  He has not had any change in his bowels or urine.  There have been no fever, vomiting or dizziness.  He came here for evaluation, by EMS.  There are no other known modifying factors.    Past Medical History:  Diagnosis Date  . Alcohol abuse   . Colorectal cancer ([place])   . GERD (gastroesophageal reflux disease)   . Hepatitis   . History of substance abuse ([place])   . Neuropathy   . Osteoarthritis   . Prostate cancer ([place])   . Seizures ([place])    as a child  . Shoulder pain   . Tobacco use     Patient Active Problem List   Diagnosis Date Noted  . Goals of care, counseling/discussion 10/22/2019  . Squamous cell carcinoma of head and neck ([place]) 10/04/2019  . Malignant neoplasm of prostate ([place]) 10/03/2017  . Coronary artery disease involving native coronary artery of native heart without angina pectoris 10/13/2016  . Injury of tendon of long head of right biceps 10/05/2016  . Stable angina ([place]) 10/03/2016  . Complete tear of right rotator cuff 08/30/2016  . Rotator cuff tendinitis, right 08/30/2016  . GERD (gastroesophageal reflux disease)   . Shoulder pain   . Osteoarthritis   . History of substance abuse ([place])   . Colorectal cancer ([place])   . ALCOHOL USE 04/26/2009  . PUD 04/26/2009  . HEPATITIS C 04/22/2009    Past Surgical History:  Procedure Laterality Date  . CARDIAC CATHETERIZATION N/A 10/03/2016   Procedure: Left Heart Cath and Coronary Angiography;   Surgeon: Corey Skains, MD;  Location: [facility];  Service: Cardiovascular;  Laterality: N/A;  . COLON SURGERY    . colorectal cancer     "scraped off"   . PROSTATE BIOPSY    . SHOULDER ARTHROSCOPY WITH BICEPS TENDON REPAIR Right 10/03/2016   Procedure: SHOULDER ARTHROSCOPIC LIMITED DEBRIDEMENT  WITH BICEPS TENOLYSIS;  Surgeon: Corky Mull, MD;  Location: [facility];  Service: Orthopedics;  Laterality: Right;  . Stomach ulcers         Family History  Problem Relation Age of Onset  . Heart attack Mother   . Colon cancer Father   . Cancer Father        prostate  . Cancer Brother        prostate    Social History   Tobacco Use  . Smoking status: Current Every Day Smoker    Packs/day: 0.50    Years: 45.00    Pack years: 22.50    Types: Cigarettes  . Smokeless tobacco: Never Used  Vaping Use  . Vaping Use: Never used  Substance Use Topics  . Alcohol use: Yes    Comment: occasional glass of wine  . Drug use: Yes    Types: Marijuana    Home Medications Prior to Admission medications   Medication Sig Start Date End Date  Taking? Authorizing Provider  acetaminophen (TYLENOL) 500 MG tablet Take 500 mg by mouth every 6 (six) hours as needed for mild pain.    Yes [provider]  Cholecalciferol (D3-1000) 25 MCG (1000 UT) tablet Take 1,000 Units by mouth daily.   Yes [provider]  docusate sodium (COLACE) 100 MG capsule Take 100 mg by mouth at bedtime.    Yes [provider]  donepezil (ARICEPT) 10 MG tablet Take 10 mg by mouth at bedtime.  08/04/16  Yes [provider]  gabapentin (NEURONTIN) 300 MG capsule Take 300 mg by mouth 3 (three) times daily.  08/29/19  Yes [provider]  mirtazapine (REMERON) 7.5 MG tablet Take 7.5 mg by mouth at bedtime.  10/21/19  Yes [provider]  magic mouthwash SOLN Take 5-10 mLs by mouth 4 (four) times daily as needed for mouth pain. Patient not taking: Reported on 03/25/2020  12/26/19   Lloyd Huger, MD  naltrexone (DEPADE) 50 MG tablet Take 50 mg by mouth daily.    [provider]  naproxen (NAPROSYN) 250 MG tablet Take by mouth. 03/30/20   [provider]  ondansetron (ZOFRAN) 8 MG tablet Take 8 mg by mouth daily. Patient not taking: Reported on 05/25/2020 03/30/20   [provider]  tamsulosin (FLOMAX) 0.4 MG CAPS capsule Take 0.4 mg by mouth daily after supper.  08/29/19   [provider]    Allergies    Patient has no known allergies.  Review of Systems   Review of Systems  All other systems reviewed and are negative.   Physical Exam Updated Vital Signs BP 134/70   Pulse 63   Temp 98.1 F (36.7 C) (Oral)   Resp 17   Ht 5\' 9"  (1.753 m)   Wt 67.1 kg   SpO2 98%   BMI 21.86 kg/m   Physical Exam Vitals and nursing note reviewed.  Constitutional:      General: He is not in acute distress.    Appearance: He is well-developed. He is not ill-appearing, toxic-appearing or diaphoretic.     Comments: He appears frail and undernourished  HENT:     Head: Normocephalic and atraumatic.     Right Ear: External ear normal.     Left Ear: External ear normal.  Eyes:     Conjunctiva/sclera: Conjunctivae normal.     Pupils: Pupils are equal, round, and reactive to light.  Neck:     Trachea: Phonation normal.  Cardiovascular:     Rate and Rhythm: Normal rate.  Pulmonary:     Effort: Pulmonary effort is normal.  Abdominal:     General: There is no distension.  Musculoskeletal:        General: No swelling or tenderness. Normal range of motion.     Cervical back: Normal range of motion and neck supple.     Comments: Back is tender left lumbar and left SI region.  No associated deformity.  Skin:    General: Skin is warm and dry.  Neurological:     Mental Status: He is alert and oriented to person, place, and time.     Cranial Nerves: No cranial nerve deficit.     Sensory: No sensory deficit.     Motor: No  abnormal muscle tone.     Coordination: Coordination normal.  Psychiatric:        Mood and Affect: Mood normal.        Behavior: Behavior normal.  Thought Content: Thought content normal.        Judgment: Judgment normal.     ED Results / Procedures / Treatments   Labs (all labs ordered are listed, but only abnormal results are displayed) Labs Reviewed  URINALYSIS, ROUTINE W REFLEX MICROSCOPIC - Abnormal; Notable for the following components:      Result Value   Color, Urine STRAW (*)    Specific Gravity, Urine 1.003 (*)    All other components within normal limits    EKG None  Radiology CT Lumbar Spine Wo Contrast  Result Date: 08/12/2020 CLINICAL DATA:  Low back pain EXAM: CT LUMBAR SPINE WITHOUT CONTRAST TECHNIQUE: Multidetector CT imaging of the lumbar spine was performed without intravenous contrast administration. Multiplanar CT image reconstructions were also generated. COMPARISON:  None. FINDINGS: Segmentation: Standard Alignment: Normal Vertebrae: Mild multilevel degenerative height loss. No fracture or focal osseous lesion. Paraspinal and other soft tissues: Calcific aortic atherosclerosis. Distended urinary bladder. Disc levels: T12-L1: Small right asymmetric disc bulge. No spinal canal stenosis. L1-2: Mild facet hypertrophy.  No spinal canal stenosis. L2-3: Moderate facet hypertrophy with small disc bulge. No stenosis. L3-4: Intermediate sized disc bulge and moderate facet hypertrophy. No spinal canal or neural foraminal stenosis. L4-5: Disc bulge with moderate facet hypertrophy. Mild left foraminal stenosis. No spinal canal stenosis. L5-S1: Moderate facet hypertrophy with intermediate diffuse disc bulge. No spinal canal stenosis or neural impingement. IMPRESSION: 1. No acute fracture or static subluxation of the lumbar spine. 2. Mild left L4-5 foraminal stenosis. 3. Distended urinary bladder. Aortic Atherosclerosis (ICD10-I70.0). Electronically Signed   By: Ulyses Jarred  M.D.   On: 08/12/2020 19:10    Procedures Procedures (including critical care time)  Medications Ordered in ED Medications  acetaminophen (TYLENOL) tablet 650 mg (has no administration in time range)    ED Course  I have reviewed the triage vital signs and the nursing notes.  Pertinent labs & imaging results that were available during my care of the patient were reviewed by me and considered in my medical decision making (see chart for details).    MDM Rules/Calculators/A&P                           Patient Vitals for the past 24 hrs:  BP Temp Temp src Pulse Resp SpO2 Height Weight  08/12/20 2130 134/70 -- -- 63 17 98 % -- --  08/12/20 2030 128/67 -- -- (!) 57 -- 95 % -- --  08/12/20 1830 117/74 -- -- 66 18 99 % -- --  08/12/20 1630 101/74 -- -- (!) 54 18 95 % -- --  08/12/20 1434 (!) 87/63 98.1 F (36.7 C) Oral 83 18 96 % -- --  08/12/20 1433 -- -- -- -- -- -- 5\' 9"  (1.753 m) 67.1 kg    9:34 PM Reevaluation with update and discussion. After initial assessment and treatment, an updated evaluation reveals no change in clinical status, findings discussed with the patient and all questions were answered. Daleen Bo   Medical Decision Making:  This patient is presenting for evaluation of low back pain, which does require a range of treatment options, and is a complaint that involves a moderate risk of morbidity and mortality. The differential diagnoses include radiculopathy, bony injury, metastatic cancer. I decided to review old records, and in summary elderly male, with ongoing pain, low back, without trauma.  I do not require additional historical information from anyone.  Clinical Laboratory Tests Ordered,  included Urinalysis. Review indicates normal. Radiologic Tests Ordered, included CT lumbar sacral spine.  I independently Visualized: CT images, which show normal findings    I ordered and Reviewed the post void bladder scan medicine Test.  50 cc urine,  normal  Critical Interventions-clinical evaluation, laboratory testing, CT imaging, observation reassessment  After These Interventions, the Patient was reevaluated and was found stable for discharge.  He has degenerative changes of the low back, likely causing pain.  Doubt lumbar myelopathy.  CRITICAL CARE-no Performed by: Daleen Bo  Nursing Notes Reviewed/ Care Coordinated Applicable Imaging Reviewed Interpretation of Laboratory Data incorporated into ED treatment  The patient appears reasonably screened and/or stabilized for discharge and I doubt any other medical condition or other [facility] requiring further screening, evaluation, or treatment in the ED at this time prior to discharge.  Plan: Home Medications-continue usual medicines, use Tylenol for pain; Home Treatments-heat to affected area; return here if the recommended treatment, does not improve the symptoms; Recommended follow up-PCP follow-up if not better in 3 to 4 days     Final Clinical Impression(s) / ED Diagnoses Final diagnoses:  Spondylosis of lumbar region without myelopathy or radiculopathy    Rx / DC Orders ED Discharge Orders    None       Daleen Bo, MD 08/13/20 [phone]

## 2020-08-12 NOTE — ED Triage Notes (Addendum)
Pt brought in by EMS with complaints of left hip pain for 2 week. Completed treatment for throat cancer several months ago  Reports having cancer in his back previously

## 2020-08-12 NOTE — Discharge Instructions (Addendum)
You have arthritis in your lower back which is causing the pain.  Use heat on the sore area 3 or 4 times a day for 30 minutes.  Also take Tylenol, every 4 hours as needed for pain.  See the doctor of your choice for checkup if not better in 5 to 7 days.

## 2020-08-16 ENCOUNTER — Ambulatory Visit
Admission: RE | Admit: 2020-08-16 | Discharge: 2020-08-16 | Disposition: A | Discharge status: Home / Self Care | Payer: Medicare HMO | Source: Ambulatory Visit | Attending: Radiation Oncology | Admitting: Radiation Oncology

## 2020-08-16 ENCOUNTER — Encounter: Payer: Self-pay | Admitting: Radiation Oncology

## 2020-08-16 ENCOUNTER — Inpatient Hospital Stay: Payer: Medicare HMO | Attending: Hospice and Palliative Medicine | Admitting: Hospice and Palliative Medicine

## 2020-08-16 VITALS — BP 89/55 | HR 66 | Temp 98.6°F | Resp 16 | Wt 111.7 lb

## 2020-08-16 DIAGNOSIS — C760 Malignant neoplasm of head, face and neck: Secondary | ICD-10-CM | POA: Diagnosis not present

## 2020-08-16 DIAGNOSIS — Z515 Encounter for palliative care: Secondary | ICD-10-CM

## 2020-08-16 DIAGNOSIS — R52 Pain, unspecified: Secondary | ICD-10-CM | POA: Diagnosis not present

## 2020-08-16 DIAGNOSIS — C7951 Secondary malignant neoplasm of bone: Secondary | ICD-10-CM | POA: Insufficient documentation

## 2020-08-16 DIAGNOSIS — C01 Malignant neoplasm of base of tongue: Secondary | ICD-10-CM | POA: Diagnosis not present

## 2020-08-16 DIAGNOSIS — Z923 Personal history of irradiation: Secondary | ICD-10-CM | POA: Insufficient documentation

## 2020-08-16 DIAGNOSIS — Z85038 Personal history of other malignant neoplasm of large intestine: Secondary | ICD-10-CM | POA: Insufficient documentation

## 2020-08-16 DIAGNOSIS — Z79899 Other long term (current) drug therapy: Secondary | ICD-10-CM | POA: Diagnosis not present

## 2020-08-16 DIAGNOSIS — C4442 Squamous cell carcinoma of skin of scalp and neck: Secondary | ICD-10-CM | POA: Insufficient documentation

## 2020-08-16 DIAGNOSIS — Z8619 Personal history of other infectious and parasitic diseases: Secondary | ICD-10-CM | POA: Insufficient documentation

## 2020-08-16 DIAGNOSIS — Z8546 Personal history of malignant neoplasm of prostate: Secondary | ICD-10-CM | POA: Diagnosis not present

## 2020-08-16 MED ORDER — DEXAMETHASONE 2 MG PO TABS: ORAL_TABLET | ORAL | 0 refills | Status: DC

## 2020-08-16 NOTE — Progress Notes (Signed)
Radiation Oncology Follow up Note  Name: Zachary Gilmore   Date:   08/16/2020 MRN:  [id number] DOB: 06-30-1956    This 64 y.o. 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  Seen today is in excruciating pain was seen last week in the emergency department.Zachary Gilmore  He is not on narcotic analgesics at this time.  COMPLICATIONS OF TREATMENT: none  FOLLOW UP COMPLIANCE: keeps appointments   PHYSICAL EXAM:  BP (!) 89/55 (BP Location: Left Arm, Patient Position: Sitting, Cuff Size: Small)   Pulse 66   Temp 98.6 F (37 C) (Tympanic)   Resp 16   Wt 111 lb 11.2 oz (50.7 kg)   BMI 16.50 kg/m  Frail-appearing male in moderate pain discomfort wheelchair-bound.  Range of motion of his lower extremity does not elicit pain motor and sensory levels are equal symmetric in the lower extremities.  Well-developed well-nourished patient in NAD. HEENT reveals PERLA, EOMI, discs not visualized.  Oral cavity is clear. No oral mucosal lesions are identified. Neck is clear without evidence of cervical or supraclavicular adenopathy. Lungs are clear to A&P. Cardiac examination is essentially unremarkable with regular rate and rhythm without murmur rub or thrill. Abdomen is benign with no organomegaly or masses noted. Motor sensory and DTR levels are equal and symmetric in the upper and lower extremities. Cranial nerves II through XII are grossly intact. Proprioception is intact. No peripheral adenopathy or edema is identified. No motor or sensory levels are noted. Crude visual fields are within normal range.  RADIOLOGY RESULTS: Patient had an MRI scan of his lumbar spine  from the emergency room on the second.  This showed no evidence of fracture or lumbar spine pathology.  PLAN: This time I referring the patient to palliative care.  I am asked him to take over follow-up with this patient for pain management and support.  I be happy to reevaluate the patient anytime should further palliative treatment be indicated.  I would like to take this opportunity to thank you for allowing me to participate in the care of your patient.Noreene Filbert, MD

## 2020-08-16 NOTE — Progress Notes (Signed)
[place]  [phone] [phone]   Name: Zachary Gilmore Date: 08/16/2020 MRN: [id number]  DOB: 1956-01-23  Patient Care Team: Carlos American, FNP as PCP - General (Family Medicine) Lloyd Huger, MD as Consulting Physician (Hematology and Oncology) Lloyd Huger, MD as Consulting Physician (Oncology) Clyde Canterbury, MD as Referring Physician (Otolaryngology)    REASON FOR CONSULTATION: Zachary Gilmore is a 64 y.o. 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    PET scan on 05/05/2020 revealed marked reduction in size and activity of nodal mass in neck but new hypermetabolic lesion in the left iliac bone.  Patient underwent XRT to hip.  However, patient has continued to have severe pain.  He was referred to palliative care to address goals and manage ongoing symptoms.  SOCIAL HISTORY:     reports that he has been smoking cigarettes. He has a 22.50 pack-year smoking history. He has never used smokeless tobacco. He reports current alcohol use. He reports current drug use. Drug: Marijuana.  Patient used to reside in an ALF but it was closed.  He is now living with the previous owner of the ALF. APS with [facility] has been previously involved due to concerns that there was a bedbug infestation in the home but these concerns ultimately were unfounded.   Patient is unmarried.  He has a son and Zachary Gilmore.  Patient also has a brother who is involved in his care.  ADVANCE DIRECTIVES:  Does not have  CODE STATUS: DNR/DNI (DNR form completed on 08/16/2020)  PAST MEDICAL HISTORY: Past Medical History:  Diagnosis Date  . Alcohol abuse   . Colorectal cancer ([place])   . GERD  (gastroesophageal reflux disease)   . Hepatitis   . History of substance abuse ([place])   . Neuropathy   . Osteoarthritis   . Prostate cancer ([place])   . Seizures ([place])    as a child  . Shoulder pain   . Tobacco use     PAST SURGICAL HISTORY:  Past Surgical History:  Procedure Laterality Date  . CARDIAC CATHETERIZATION N/A 10/03/2016   Procedure: Left Heart Cath and Coronary Angiography;  Surgeon: Corey Skains, MD;  Location: [place] [facility];  Service: Cardiovascular;  Laterality: N/A;  . COLON SURGERY    . colorectal cancer     "scraped off"   . PROSTATE BIOPSY    . SHOULDER ARTHROSCOPY WITH BICEPS TENDON REPAIR Right 10/03/2016   Procedure: SHOULDER ARTHROSCOPIC LIMITED DEBRIDEMENT  WITH BICEPS TENOLYSIS;  Surgeon: Corky Mull, MD;  Location: [facility];  Service: Orthopedics;  Laterality: Right;  . Stomach ulcers      HEMATOLOGY/ONCOLOGY HISTORY:  Oncology History  Squamous cell carcinoma of head and neck ([place])  10/04/2019 Initial Diagnosis   Squamous cell carcinoma of head and neck ([place])   11/07/2019 Cancer Staging   Staging form: Cervical Lymph Nodes and Unknown Primary Tumors of the Head and Neck, AJCC 8th Edition - Clinical stage from 11/07/2019: Stage IVA (cT0, cN2a, cM0) - Signed by Lloyd Huger, MD on 11/07/2019   11/20/2019 -  Chemotherapy   The patient had palonosetron (ALOXI) injection 0.25 mg, 0.25 mg, Intravenous,  Once, 7 of 7 cycles Administration: 0.25 mg (11/20/2019), 0.25  mg (11/28/2019), 0.25 mg (12/05/2019), 0.25 mg (12/12/2019), 0.25 mg (12/19/2019), 0.25 mg (12/26/2019), 0.25 mg (01/02/2020) CISplatin (PLATINOL) 70 mg in sodium chloride 0.9 % 250 mL chemo infusion, 40 mg/m2 = 70 mg, Intravenous,  Once, 7 of 7 cycles Administration: 70 mg (11/20/2019), 70 mg (11/28/2019), 70 mg (12/05/2019), 70 mg (12/12/2019), 70 mg (12/19/2019), 70 mg (12/26/2019), 70 mg (01/02/2020) fosaprepitant (EMEND) 150 mg in sodium chloride 0.9 % 145 mL IVPB, 150 mg, Intravenous,  Once, 7 of  7 cycles Administration: 150 mg (11/20/2019), 150 mg (11/28/2019), 150 mg (12/05/2019), 150 mg (12/12/2019), 150 mg (12/19/2019), 150 mg (12/26/2019), 150 mg (01/02/2020)  for chemotherapy treatment.      ALLERGIES:  has No Known Allergies.  MEDICATIONS:  Current Outpatient Medications  Medication Sig Dispense Refill  . acetaminophen (TYLENOL) 500 MG tablet Take 500 mg by mouth every 6 (six) hours as needed for mild pain.     . Cholecalciferol (D3-1000) 25 MCG (1000 UT) tablet Take 1,000 Units by mouth daily.    [name] docusate sodium (COLACE) 100 MG capsule Take 100 mg by mouth at bedtime.     . donepezil (ARICEPT) 10 MG tablet Take 10 mg by mouth at bedtime.     . gabapentin (NEURONTIN) 300 MG capsule Take 300 mg by mouth 3 (three) times daily.     . magic mouthwash SOLN Take 5-10 mLs by mouth 4 (four) times daily as needed for mouth pain. 480 mL 0  . mirtazapine (REMERON) 7.5 MG tablet Take 7.5 mg by mouth at bedtime.     . naltrexone (DEPADE) 50 MG tablet Take 50 mg by mouth daily.    . naproxen (NAPROSYN) 250 MG tablet Take by mouth.    . ondansetron (ZOFRAN) 8 MG tablet Take 8 mg by mouth daily.     . tamsulosin (FLOMAX) 0.4 MG CAPS capsule Take 0.4 mg by mouth daily after supper.      No current facility-administered medications for this visit.    VITAL SIGNS: There were no vitals taken for this visit. There were no vitals filed for this visit.  Estimated body mass index is 16.5 kg/m as calculated from the following:   Height as of 08/12/20: 5\' 9"  (1.753 m).   Weight as of an earlier encounter on 08/16/20: 111 lb 11.2 oz (50.7 kg).  LABS: CBC:    Component Value Date/Time   WBC 8.6 05/25/2020 1035   HGB 14.2 05/25/2020 1035   HGB 16.8 05/25/2016 1347   HCT 41.1 05/25/2020 1035   HCT 50.5 05/25/2016 1347   PLT 241 05/25/2020 1035   PLT 183 05/25/2016 1347   MCV 90.9 05/25/2020 1035   MCV 94 05/25/2016 1347   NEUTROABS 6.0 05/05/2020 1124   NEUTROABS 5.0 05/25/2016 1347    LYMPHSABS 0.9 05/05/2020 1124   LYMPHSABS 2.1 05/25/2016 1347   MONOABS 0.5 05/05/2020 1124   EOSABS 0.1 05/05/2020 1124   EOSABS 0.2 05/25/2016 1347   BASOSABS 0.0 05/05/2020 1124   BASOSABS 0.1 05/25/2016 1347   Comprehensive Metabolic Panel:    Component Value Date/Time   NA 136 05/05/2020 1124   K 4.7 05/05/2020 1124   CL 98 05/05/2020 1124   CO2 31 05/05/2020 1124   BUN 7 (L) 05/05/2020 1124   CREATININE 1.00 06/10/2020 1134   GLUCOSE 104 (H) 05/05/2020 1124   CALCIUM 9.1 05/05/2020 1124   AST 18 03/25/2020 0946   ALT 12 03/25/2020 0946   ALKPHOS 74 03/25/2020 0946   BILITOT 0.6  03/25/2020 0946   BILITOT 0.5 05/25/2016 1347   PROT 6.9 03/25/2020 0946   PROT 7.1 05/25/2016 1347   ALBUMIN 4.0 03/25/2020 0946   ALBUMIN 4.2 05/25/2016 1347    RADIOGRAPHIC STUDIES: CT Lumbar Spine Wo Contrast  Result Date: 08/12/2020 CLINICAL DATA:  Low back pain EXAM: CT LUMBAR SPINE WITHOUT CONTRAST TECHNIQUE: Multidetector CT imaging of the lumbar spine was performed without intravenous contrast administration. Multiplanar CT image reconstructions were also generated. COMPARISON:  None. FINDINGS: Segmentation: Standard Alignment: Normal Vertebrae: Mild multilevel degenerative height loss. No fracture or focal osseous lesion. Paraspinal and other soft tissues: Calcific aortic atherosclerosis. Distended urinary bladder. Disc levels: T12-L1: Small right asymmetric disc bulge. No spinal canal stenosis. L1-2: Mild facet hypertrophy.  No spinal canal stenosis. L2-3: Moderate facet hypertrophy with small disc bulge. No stenosis. L3-4: Intermediate sized disc bulge and moderate facet hypertrophy. No spinal canal or neural foraminal stenosis. L4-5: Disc bulge with moderate facet hypertrophy. Mild left foraminal stenosis. No spinal canal stenosis. L5-S1: Moderate facet hypertrophy with intermediate diffuse disc bulge. No spinal canal stenosis or neural impingement. IMPRESSION: 1. No acute fracture or static  subluxation of the lumbar spine. 2. Mild left L4-5 foraminal stenosis. 3. Distended urinary bladder. Aortic Atherosclerosis (ICD10-I70.0). Electronically Signed   By: Ulyses Jarred M.D.   On: 08/12/2020 19:10    PERFORMANCE STATUS (ECOG) : 2 - Symptomatic, <50% confined to bed  Review of Systems Unless otherwise noted, a complete review of systems is negative.  Physical Exam General: Thin, frail-appearing Pulmonary: Unlabored Extremities: no edema, no joint deformities Skin: no rashes Neurological: Weakness but otherwise nonfocal  IMPRESSION: Patient was an add-on to my clinic schedule today at the request of Dr. Baruch Gouty for evaluation and management of pain.  Patient is a month out from completing XRT to left iliac bone.  Patient says that XRT did not help the pain and that he is still having severe and persistent pain to the hip.  He denies any changes to mobility.  He denies changes to intensity or frequency of the pain.  He describes the pain as being "dull" and never really goes away.  Patient is not the best historian regarding his medications.  He lives with the former owner of his group home, [name].  I attempted to call [name] today but was unable to reach her.  Per med list, patient was taking acetaminophen and naproxen but he did not think those were helping.  He is on naltrexone, presumably for management of EtOH addiction and therefore will steer clear of opioid-based pain medications if possible.  Note that patient says he has not had etoh in at least six months. We will stop the naproxen and start him on a short course of steroids given probable inflammatory component to bone lesion.  Would consider reimaging hip if pain persists.  Note the patient has also had chronically poor oral intake and weight loss.  He is not currently drinking oral supplements and we discussed restarting those.  He has been followed in the past by our dietitian and he will be referred back to [facility].  We  will also see if short course of steroids act to stimulate his appetite.  Note that patient is also on mirtazapine at bedtime.  Patient says that he would want his brother to be his medical decision-maker if needed.  Patient was sent home with ACP documents today.  Patient stated clearly and repeatedly that he would not want to be resuscitated nor have  his life prolonged artificially on machines.  I encouraged him to his discuss this with family.  Patient requested a DNR order which was provided for him to take home today.  PLAN: -Continue current scope of treatment -Start dexamethasone 4 mg daily -Consider reimaging hip if pain persists -Referral to home-based palliative care -Referral to nutrition -DNR/DNI -Virtual visit 2-3 weeks and will plan to see patient back in the clinic in February   Patient expressed understanding and was in agreement with this plan. He also understands that He can call the clinic at any time with any questions, concerns, or complaints.     Time Total: 30 minutes  Visit consisted of counseling and education dealing with the complex and emotionally intense issues of symptom management and palliative care in the setting of serious and potentially life-threatening illness.Greater than 50%  of this time was spent counseling and coordinating care related to the above assessment and plan.  Signed by: Altha Harm, PhD, NP-C

## 2020-08-24 ENCOUNTER — Telehealth: Payer: Self-pay | Admitting: *Deleted

## 2020-08-24 DIAGNOSIS — C760 Malignant neoplasm of head, face and neck: Secondary | ICD-10-CM

## 2020-08-24 NOTE — Telephone Encounter (Signed)
I returned brother's call but he was not available. I instead spoke with patient's sister in law. She says that patient is staying with them temporarily in [facility]. Patient has had severe pain in left hip. She thinks that steroids might have initially helped but patient has not had any since coming to stay with them last week. She says patient falls frequently. No other significant changes.   Discussed with Dr. Grayland Ormond. Given persistent pain, will obtain MRI hip before considering additional pharmacological options for pain management. Family was in agreement with this plan.

## 2020-08-24 NOTE — Telephone Encounter (Signed)
Patients brother Orpah Greek ( I do not see a release of information in chart) called to states that patient is having severe hip pain and he is asking what can be done for this. Please advise He is requesting you call him back [phone]

## 2020-08-25 ENCOUNTER — Telehealth: Payer: Self-pay | Admitting: Hospice and Palliative Medicine

## 2020-08-25 ENCOUNTER — Telehealth: Payer: Self-pay | Admitting: Primary Care

## 2020-08-25 ENCOUNTER — Other Ambulatory Visit: Payer: Self-pay

## 2020-08-25 ENCOUNTER — Ambulatory Visit
Admission: RE | Admit: 2020-08-25 | Discharge: 2020-08-25 | Disposition: A | Discharge status: Home / Self Care | Payer: Medicare HMO | Source: Ambulatory Visit | Attending: Hospice and Palliative Medicine | Admitting: Hospice and Palliative Medicine

## 2020-08-25 DIAGNOSIS — C760 Malignant neoplasm of head, face and neck: Secondary | ICD-10-CM | POA: Insufficient documentation

## 2020-08-25 MED ORDER — OXYCODONE HCL 5 MG PO TABS: 5.0000 mg | ORAL_TABLET | ORAL | 0 refills | Status: DC | PRN

## 2020-08-25 MED ORDER — GADOBUTROL 1 MMOL/ML IV SOLN
5.0000 mL | Freq: Once | INTRAVENOUS | Status: AC | PRN
  Administered 2020-08-25: 5 mL via INTRAVENOUS

## 2020-08-25 NOTE — Telephone Encounter (Signed)
Spoke with patient and patient's friend, Marcelo Baldy (patient lives with her) regarding the Palliative referral/services and patient was in agreement with scheduling a visit.  I have scheduled an In-person Consult for 09/02/20 @ 10 AM.

## 2020-08-25 NOTE — Telephone Encounter (Signed)
MRI appears to show disease progression in the left hip compared to previous PET scan in August.  I called patient and updated him regarding the results of imaging.  He continues to have severe pain in left hip.  He says he has not taken the naltrexone in some months.  I confirmed this with his caregiver - [name].  We will start him on oxycodone as needed for pain  Discussed with Dr. Grayland Ormond.  We will plan to move PET scan and subsequent follow-up appointments to January.  PDMP reviewed.  Also tried unsuccessfully to call patient's brother.  Plan: -DC naltrexone -Start oxycodone 5 mg every 6 hours as needed for pain #60 -Move PET scan and subsequent follow-up appointments to January

## 2020-08-26 ENCOUNTER — Telehealth: Payer: Self-pay | Admitting: Hospice and Palliative Medicine

## 2020-08-26 NOTE — Telephone Encounter (Signed)
I received a call from patient's sister-in-law, Zachary Gilmore.  I returned her call at [phone].  Together, we discussed the patient's imaging and plan for repeat PET scan in January.  She says the patient remains in pain and does not feel that the oxycodone has been particularly helpful so far.  She is not clear clear that patient or his caregiver [name]) is completely understanding of what patient is taking.  I discussed the medication regimen in detail with Zachary Gilmore and she will coordinate.  Okay for patient to increase oxycodone to 5 to 10 mg every 4 hours as needed.  I would also recommend that he continue the dexamethasone.  I again reiterated the patient should not be taking naltrexone.  Patient has a scheduled visit next week with [name], NP.  I would greatly appreciate her assistance with determining what medications patient is actually taking at home.

## 2020-08-27 ENCOUNTER — Telehealth: Payer: Self-pay | Admitting: Primary Care

## 2020-08-27 NOTE — Telephone Encounter (Signed)
Rec'd call from patient's friend that he is staying with, Marcelo Baldy, and she said that patient wanted to cancel Palliative services at this time.  Patient stated that he was moving in with his brother and declined services.  I will notify MD and Palliative Team.

## 2020-08-31 ENCOUNTER — Telehealth: Payer: Self-pay | Admitting: *Deleted

## 2020-08-31 ENCOUNTER — Other Ambulatory Visit: Payer: Self-pay | Admitting: Oncology

## 2020-08-31 MED ORDER — MORPHINE SULFATE ER 15 MG PO TBCR: 15.0000 mg | EXTENDED_RELEASE_TABLET | Freq: Two times a day (BID) | ORAL | 0 refills | Status: DC

## 2020-08-31 NOTE — Telephone Encounter (Signed)
[place], patient sister in law called reporting that patient continues to be in pain even with the recent medicine changrs by Sharion Dove, NP and she would like a return call to discuss this. [phone]

## 2020-08-31 NOTE — Telephone Encounter (Signed)
Re: Pain Management  Oxycodone recently increased from 5-10 mg every 4 hours by Josh last week for worsening hip pain.  Spoke with patient's Sister Santiago Glad who states his pain is not controlled even with the 10 mg every 4 hours.  She is asking if there is anything additional we can do to help manage his pain.  I recommend adding a long-acting medication.  Prescription sent for MS Contin 15 mg every 12 hours.  We did speak at length about how this medication works and it may take some time for him to start feeling the effects.  Faythe Casa, NP 08/31/2020 3:03 PM

## 2020-09-01 ENCOUNTER — Telehealth: Payer: Self-pay | Admitting: Oncology

## 2020-09-01 NOTE — Telephone Encounter (Signed)
Re: Pain control  Spoke to patient's sister Santiago Glad who is concerned about his pain in his hip that is unrelieved with current pain regimen.  Patient's narcotics were recently increased from 5 mg oxycodone every 4 h to 5 to 10 mg of oxycodone every 4 hours.  Santiago Glad is giving him 10 mg every 4 hours without significant pain control.  She is asking if anything else can be done for him.  I recommend adding a long-acting narcotic.  We'll go ahead and start MS Contin 15 mg every 12 hours and he is to continue his oxycodone 5 to 10 mg every 4 as needed.  We spoke at length about oversedation and that OxyContin may take 12 to 24 h to really make a difference in his pain.   Rx 15 mg MS Contin every 12 hours sent to his pharmacy.  They're to call if his symptoms worsen or if pain is not adequately controlled.  Faythe Casa, NP 09/01/2020 8:54 AM

## 2020-09-02 ENCOUNTER — Other Ambulatory Visit: Payer: Self-pay | Admitting: Primary Care

## 2020-09-06 ENCOUNTER — Telehealth: Payer: Self-pay | Admitting: *Deleted

## 2020-09-06 MED ORDER — OXYCODONE HCL 10 MG PO TABS
5.0000 mg | ORAL_TABLET | ORAL | 0 refills | Status: DC | PRN
Start: 2020-09-06 — End: 2020-09-28

## 2020-09-06 NOTE — Telephone Encounter (Signed)
Patient sister in law called requesting to speak with J Borders regarding patient pain. Please return her call [phone]

## 2020-09-06 NOTE — Telephone Encounter (Signed)
I spoke with patient's brother, Orpah Greek.  He reports that pain is improved after starting MS Contin.  However, they are still giving him oxycodone periodically throughout the day and at bedtime to help him sleep.  Family request a refill of oxycodone.  Family are at times giving him 2 tablets (10 mg).  Will increase oxycodone to 10 mg tablets.  Brother says that he is exploring options for LTC placement in [place] near where he lives.  Patient would likely need an FL2 and can discuss at time of clinic visit next week if needed.

## 2020-09-12 NOTE — Progress Notes (Signed)
[facility]  Telephone:([phone]) [phone] Fax:(336) [phone]  ID: Zachary Gilmore OB: 1956/01/11  MR#: [id number]  MAU#:[id number]  Patient Care Team: Carlos American, FNP as PCP - General (Family Medicine) Lloyd Huger, MD as Consulting Physician (Hematology and Oncology) Lloyd Huger, MD as Consulting Physician (Oncology) Clyde Canterbury, MD as Referring Physician (Otolaryngology)  I connected with [name] on 09/18/20 at 10:00 AM EST by video enabled telemedicine visit and verified that I am speaking with the correct person using two identifiers.   I discussed the limitations, risks, security and privacy concerns of performing an evaluation and management service by telemedicine and the availability of in-person appointments. I also discussed with the patient that there may be a patient responsible charge related to this service. The patient expressed understanding and agreed to proceed.   Other persons participating in the visit and their role in the encounter: Patient, patient's brother and sister, MD.  Patient's location: Home. Provider's location: Clinic.  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  He continues to have significant pain as well as increased weakness and fatigue.  His performance status is declining.  He has no neurologic complaints, but his brother has reported increased confusion.  He denies any recent fevers or illnesses.  He has no chest pain, shortness of breath, cough, or hemoptysis.  He denies any nausea, vomiting, or diarrhea.  He has no urinary complaints.  Patient offers no further specific complaints today.  REVIEW OF SYSTEMS:   Review of Systems  Constitutional: Positive for malaise/fatigue. Negative for fever and weight loss.   HENT: Negative for sore throat.   Respiratory: Negative.  Negative for cough and shortness of breath.   Cardiovascular: Negative.  Negative for chest pain and leg swelling.  Gastrointestinal: Positive for constipation. Negative for abdominal pain, blood in stool and melena.  Genitourinary: Positive for flank pain. Negative for dysuria.  Musculoskeletal: Positive for back pain. Negative for joint pain and neck pain.  Skin: Negative.  Negative for rash.  Neurological: Positive for weakness. Negative for dizziness, focal weakness and headaches.  Psychiatric/Behavioral: Positive for memory loss. The patient is not nervous/anxious.     As per HPI. Otherwise, a complete review of systems is negative.  PAST MEDICAL HISTORY: Past Medical History:  Diagnosis Date  . Alcohol abuse   . Colorectal cancer ([place])   . GERD (gastroesophageal reflux disease)   . Hepatitis   . History of substance abuse ([place])   . Neuropathy   . Osteoarthritis   . Prostate cancer ([place])   . Seizures ([place])    as a child  . Shoulder pain   . Tobacco use     PAST SURGICAL HISTORY: Past Surgical History:  Procedure Laterality Date  . CARDIAC CATHETERIZATION N/A 10/03/2016   Procedure: Left Heart Cath and Coronary Angiography;  Surgeon: Corey Skains, MD;  Location: [facility];  Service: Cardiovascular;  Laterality: N/A;  . COLON SURGERY    . colorectal cancer     "scraped off"   . PROSTATE BIOPSY    . SHOULDER ARTHROSCOPY WITH BICEPS TENDON REPAIR Right 10/03/2016   Procedure: SHOULDER ARTHROSCOPIC LIMITED DEBRIDEMENT  WITH BICEPS TENOLYSIS;  Surgeon: Corky Mull, MD;  Location: [facility];  Service: Orthopedics;  Laterality: Right;  . Stomach ulcers  FAMILY HISTORY: Family History  Problem Relation Age of Onset  . Heart attack Mother   . Colon cancer Father   . Cancer Father        prostate  . Cancer Brother        prostate    ADVANCED DIRECTIVES (Y/N):  N  HEALTH MAINTENANCE: Social  History   Tobacco Use  . Smoking status: Current Every Day Smoker    Packs/day: 0.50    Years: 45.00    Pack years: 22.50    Types: Cigarettes  . Smokeless tobacco: Never Used  Vaping Use  . Vaping Use: Never used  Substance Use Topics  . Alcohol use: Yes    Comment: occasional glass of wine  . Drug use: Yes    Types: Marijuana     Colonoscopy:  PAP:  Bone density:  Lipid panel:  No Known Allergies  Current Outpatient Medications  Medication Sig Dispense Refill  . Oxycodone HCl 10 MG TABS Take 0.5-1 tablets (5-10 mg total) by mouth every 4 (four) hours as needed. 90 tablet 0  . acetaminophen (TYLENOL) 500 MG tablet Take 500 mg by mouth every 6 (six) hours as needed for mild pain.  (Patient not taking: Reported on 09/16/2020)    . Cholecalciferol (D3-1000) 25 MCG (1000 UT) tablet Take 1 tablet (1,000 Units total) by mouth daily. 30 tablet 3  . dexamethasone (DECADRON) 4 MG tablet Take 1 tablet (4 mg total) by mouth daily. 30 tablet 0  . docusate sodium (COLACE) 100 MG capsule Take 1 capsule (100 mg total) by mouth at bedtime. 100 capsule 3  . gabapentin (NEURONTIN) 300 MG capsule Take 1 capsule (300 mg total) by mouth 3 (three) times daily. 90 capsule 3  . ondansetron (ZOFRAN) 8 MG tablet Take 1 tablet (8 mg total) by mouth every 8 (eight) hours as needed for nausea or vomiting. 45 tablet 3  . oxyCODONE (OXYCONTIN) 10 mg 12 hr tablet Take 1 tablet (10 mg total) by mouth every 12 (twelve) hours. 60 tablet 0  . senna (SENOKOT) 8.6 MG TABS tablet Take 1 tablet (8.6 mg total) by mouth daily as needed for mild constipation. 120 tablet 3  . tamsulosin (FLOMAX) 0.4 MG CAPS capsule Take 1 capsule (0.4 mg total) by mouth daily after supper. 30 capsule 3   No current facility-administered medications for this visit.    OBJECTIVE: There were no vitals filed for this visit.   Body mass index is 15.36 kg/m.    ECOG FS:1 - Symptomatic but completely ambulatory  General: Thin, no acute  distress. HEENT: Normocephalic. Neuro: Alert, answering all questions appropriately. Cranial nerves grossly intact. Psych: Normal affect.   LAB RESULTS:  Lab Results  Component Value Date   NA 136 05/05/2020   K 4.7 05/05/2020   CL 98 05/05/2020   CO2 31 05/05/2020   GLUCOSE 104 (H) 05/05/2020   BUN 7 (L) 05/05/2020   CREATININE 1.00 06/10/2020   CALCIUM 9.1 05/05/2020   PROT 6.9 03/25/2020   ALBUMIN 4.0 03/25/2020   AST 18 03/25/2020   ALT 12 03/25/2020   ALKPHOS 74 03/25/2020   BILITOT 0.6 03/25/2020   GFRNONAA >60 05/05/2020   GFRAA >60 05/05/2020    Lab Results  Component Value Date   WBC 8.6 05/25/2020   NEUTROABS 6.0 05/05/2020   HGB 14.2 05/25/2020   HCT 41.1 05/25/2020   MCV 90.9 05/25/2020   PLT 241 05/25/2020     STUDIES: MR Brain W Wo Contrast  Result Date: 09/15/2020 CLINICAL DATA:  Acute confusional state. Mental status change, unknown cause; confusion, vision changes. EXAM: MRI HEAD WITHOUT AND WITH CONTRAST TECHNIQUE: Multiplanar, multiecho pulse sequences of the brain and surrounding structures were obtained without and with intravenous contrast. CONTRAST:  81mL GADAVIST GADOBUTROL 1 MMOL/ML IV SOLN COMPARISON:  Neck CT 06/10/2020. FINDINGS: Brain: Mild cerebral atrophy. There are numerous enhancing intracranial lesions (compatible with metastatic disease) as follows. 4 mm peripherally enhancing lesion within the paramedian high left frontal lobe (series 13, image 49). 1.7 x 1.7 cm peripherally enhancing lesion within the left parietal lobe with mild surrounding edema (series 13, image 130). 4 mm peripherally enhancing lesion within the right parietal lobe (series 13, image 135). 4 mm peripherally enhancing lesion within the periventricular right frontal lobe (series 13, image 119). 2.5 x 2.2 cm peripherally enhancing lesion within the right parietooccipital lobes (series 13, image 102). Moderate surrounding edema. 2.4 x 2.3 cm peripherally enhancing lesion  within the left temporal occipital lobes (series 13, image 82). Mild surrounding edema. 3 mm enhancing lesion within the periventricular left temporal lobe (series 13, image 83). 4 mm peripherally enhancing lesion within the anterior right frontal lobe (series 13, image 98). 3 mm peripherally enhancing lesion within the right frontal operculum (series 13, image 125). 2 mm enhancing lesion within the periventricular right temporal lobe (series 13, image 90). Possible 2 mm enhancing lesion within the anterior left temporal lobe (series 13, image 66). 2.6 x 2.2 cm peripherally enhancing lesion within the superomedial right cerebellar hemisphere. Moderate surrounding edema. Associated mass effect with partial effacement of the fourth ventricle. 2.5 x 2.2 cm peripherally enhancing lesion more laterally within the right cerebellar hemisphere (series 13, image 62). Moderate surrounding edema. 5 mm peripherally enhancing lesion within the posterior right cerebellar hemisphere (series 13, image 61). 1.6 x 1.7 cm peripherally enhancing lesion within the inferior left cerebellar hemisphere (series 13, image 51). Mild surrounding edema. SWI signal loss associated with some of the above lesions likely reflecting nonacute blood products. There is dural thickening overlying the bilateral cerebral hemispheres measuring up to 4 mm likely reflecting dural-based metastatic disease. Mild multifocal T2/FLAIR hyperintensity within the cerebral white matter is nonspecific, but compatible with chronic small vessel ischemic disease. There is no acute infarct, extra-axial collection, supratentorial midline shift or evidence of hydrocephalus. Vascular: Expected proximal arterial flow voids. Skull and upper cervical spine: Multifocal T1 hypointense signal abnormality within the bilateral calvarium compatible with osseous metastatic disease. Sinuses/Orbits: Visualized orbits show no acute finding. Mild paranasal sinus mucosal thickening, most  notably ethmoidal. Other: Redemonstrated cyst within the right cheek soft tissues, now measuring 2.8 x 2.4 cm in transaxial dimensions, favored benign (series 5, image 2). There is multifocal abnormal scalp enhancement likely reflecting soft tissue tumor, the largest focus measuring 4 mm in thickness overlying a right parietal calvarial metastasis. These results will be called to the ordering clinician or representative by the Radiologist Assistant, and communication documented in the PACS or [employer]. IMPRESSION: At least 15 enhancing parenchymal lesions are identified within the supratentorial and infratentorial brain, and findings are compatible with intracranial metastatic disease. The largest lesions are located within the left parietal lobe (1.7 x 1.7 cm), right parietooccipital lobes (2.5 x 2.2 cm), left temporal occipital lobes (2.4 x 2.3 cm), right cerebellum (measuring up to 2.6 x 2.2 cm) and within the inferior left cerebellum (1.6 x 1.7 cm). Edema surrounding many of the lesions. Most notably, there is moderate edema within the right parietooccipital  lobes and right cerebellar hemisphere. Partial effacement of the fourth ventricle without evidence of hydrocephalus. Dural thickening and enhancement overlying the bilateral cerebral hemispheres, measuring up to 4 mm likely reflecting dural-based metastatic disease. Multifocal calvarial metastases. Probable multifocal soft tissue tumor within the bilateral scalp, the largest focus measuring 4 mm in thickness overlying the right parietal calvarial metastasis. Electronically Signed   By: Kellie Simmering DO   On: 09/15/2020 15:49   MR HIP LEFT W WO CONTRAST  Result Date: 08/25/2020 CLINICAL DATA:  Patient with a history of head and neck carcinoma. Left hip pain. No known injury. EXAM: MRI OF THE LEFT HIP WITHOUT AND WITH CONTRAST TECHNIQUE: Multiplanar, multisequence MR imaging was performed both before and after administration of intravenous  contrast. CONTRAST:  5 mL GADAVIST IV SOLN COMPARISON:  CT lumbar spine 08/12/2020.  PET CT scan 05/05/2020. FINDINGS: Bones: A large metastatic deposit in the left ilium extends from the posterior aspect of the ileum approximately 10 cm anteriorly. The lesion fills the medullary space and measures up to 10 cm craniocaudal. Although partially imaged, there appears to be soft tissue tumor extension out of the superior aspect of the left ilium. This finding is seen in the coronal plane only. A large metastatic deposit is also seen in S1. A deposit measuring approximately 1.5 cm is seen in the left sacral ala adjacent to the left ilium. A 0.5 cm focus of signal abnormality and enhancement left femoral neck is worrisome for metastatic disease. There is also a punctate metastatic focus in the right parasymphyseal pubic bone. No fracture is identified. Articular cartilage and labrum Articular cartilage:  Mildly degenerated. Labrum: There is degenerative tearing of the anterior and superior left labrum. Joint or bursal effusion Joint effusion:  None. Bursae: Negative. Muscles and tendons Muscles and tendons: There is edema and enhancement in the left iliacus muscle and left gluteus medius a medially adjacent to the left iliac deposit which is likely to sympathetic change. Small amount of fluid is seen subjacent to the left iliacus. Other findings Miscellaneous:   There is some free pelvic fluid. IMPRESSION: Bony metastatic disease with the largest deposits in the left ilium and S1 vertebral body. Tumor extension superiorly out of the left ilium is only seen in the coronal plane. Edema and enhancement in the left iliacus and gluteus medius muscles immediately adjacent to the patient's iliac deposit is likely sympathetic. Negative for fracture. Punctate metastatic deposit in the left femoral neck noted. Mild degenerative disease about the left hip with associated degenerative tearing the left anterior and superior labrum.  Electronically Signed   By: Inge Rise M.D.   On: 08/25/2020 11:37   NM PET Image Restag (PS) Skull Base To Thigh  Result Date: 09/14/2020 CLINICAL DATA:  Subsequent treatment strategy for squamous cell carcinoma of head neck diagnosed 05/25/2020. Status post chemotherapy. EXAM: NUCLEAR MEDICINE PET SKULL BASE TO THIGH TECHNIQUE: 5.5 mCi F-18 FDG was injected intravenously. Full-ring PET imaging was performed from the skull base to thigh after the radiotracer. CT data was obtained and used for attenuation correction and anatomic localization. Fasting blood glucose: 77 mg/dl COMPARISON:  Neck CT 06/10/2020.  PET 05/05/2020. FINDINGS: Mediastinal blood pool activity: SUV max 1.5 Liver activity: SUV max NA NECK: A focus of hypermetabolism either exophytic off or adjacent the right lobe of the thyroid measures a S.U.V. max of 8.1, including on 77/3. No other soft tissue hypermetabolism within the neck. Incidental CT findings: Presumed sebaceous cyst about the right face  at 3.0 cm. No cervical adenopathy. Bilateral carotid atherosclerosis. CHEST: Hypermetabolism about the left infraspinatus musculature is due to trauma or motion after radiopharmaceutical. Clustered hypermetabolic nodularity within the left lower lobe, including at a S.U.V. max of 2.2 on 146/3. A hypermetabolic area of soft tissue thickening within the pericardium measures 1.0 cm and a S.U.V. max of 5.9 on 159/3. Left anterior chest wall subcutaneous nodule of 9 mm and a S.U.V. max of 5.6 on 156/3. Incidental CT findings: Centrilobular and paraseptal emphysema. Aortic and coronary artery atherosclerosis. ABDOMEN/PELVIS: Right suprarenal hypermetabolism is likely adrenal in origin. Example at a S.U.V. max of 4.2 on approximately image 170/3. A nodule within the right ischiorectal fossa measures 9 mm and a S.U.V. max of 5.7 on 278/3. Probable muscular metastasis as evidenced by hypermetabolism about the proximal right thigh musculature including at  a S.U.V. max of 2.9 on approximately image 272/3. Incidental CT findings: Abdominal aortic atherosclerosis. Radiation seeds in the prostate. SKELETON: Significantly progressive osseous metastasis. For example, a permeative left iliac mass measures a S.U.V. max of 14.2 on 225/3. Surrounding soft tissue component. Compare a S.U.V. max of 12.7 on the prior exam. New left greater than right sacral hypermetabolism at a S.U.V. max of 12.2. New L1 lesion at a S.U.V. max of 7.8. Incidental CT findings: None IMPRESSION: 1. Significant progression of the osseous metastasis. 2. Soft tissue metastasis within the neck, chest, abdomen and pelvis, somewhat unusual/atypical distribution, as detailed above. 3. Aortic atherosclerosis (ICD10-I70.0), coronary artery atherosclerosis and emphysema (ICD10-J43.9). 4. Clustered left lower lobe nodularity, suspicious for aspiration or infection in this patient with a history of head neck primary. Electronically Signed   By: Abigail Miyamoto M.D.   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  PLAN:    1.  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  Patient completed weekly cisplatin on January 02, 2020 and then completed daily XRT on January 08, 2020.  PET scan results from May 05, 2020 reviewed independently with resolution of disease in patient's neck, however there is a new hypermetabolic lesion in the left iliac bone measuring 5.3 cm.  Biopsy of this area consistent with patient's original primary.  At that time, PET scan did not reveal any further disease.  PET scan and MRI results from September 14, 2020 reviewed independently and report as above with widespread metastatic disease throughout the skeleton as well as soft tissue metastasis.  Patient also noted to have greater than 10 metastatic  lesions in his brain.  Have recommended palliative XRT to his brain lesions, but given his worsening performance status it is unlikely systemic treatment will be of benefit.  We discussed the possibility of hospice and patient wishes to further discuss with his family over the weekend.  He will have an appointment with palliative care on Monday for further evaluation and discussion of next steps.   2.  Pain: Secondary to metastatic disease.  Completing narcotics as prescribed.  I provided 30 minutes of face-to-face video visit time during this encounter which included chart review, counseling, and coordination of care as documented above.   Patient expressed understanding and was in agreement with this plan. He also understands that He can call clinic at any time with any questions, concerns, or complaints.   Cancer Staging Squamous cell carcinoma of head and neck ([place]) Staging form:  Cervical Lymph Nodes and Unknown Primary Tumors of the Head and Neck, AJCC 8th Edition - Clinical stage from 11/07/2019: Stage IVA (cT0, cN2a, cM0) - Signed by Lloyd Huger, MD on 11/07/2019   Lloyd Huger, MD   09/18/2020 7:37 AM

## 2020-09-14 ENCOUNTER — Other Ambulatory Visit: Payer: Self-pay

## 2020-09-14 ENCOUNTER — Telehealth: Payer: Self-pay | Admitting: Hospice and Palliative Medicine

## 2020-09-14 ENCOUNTER — Other Ambulatory Visit: Payer: Self-pay | Admitting: *Deleted

## 2020-09-14 ENCOUNTER — Encounter
Admission: RE | Admit: 2020-09-14 | Discharge: 2020-09-14 | Disposition: A | Discharge status: Home / Self Care | Payer: Medicare HMO | Source: Ambulatory Visit | Attending: Oncology | Admitting: Oncology

## 2020-09-14 DIAGNOSIS — C7951 Secondary malignant neoplasm of bone: Secondary | ICD-10-CM | POA: Insufficient documentation

## 2020-09-14 DIAGNOSIS — I2510 Atherosclerotic heart disease of native coronary artery without angina pectoris: Secondary | ICD-10-CM | POA: Diagnosis not present

## 2020-09-14 DIAGNOSIS — C760 Malignant neoplasm of head, face and neck: Secondary | ICD-10-CM | POA: Insufficient documentation

## 2020-09-14 DIAGNOSIS — I700 Atherosclerosis of aorta: Secondary | ICD-10-CM | POA: Insufficient documentation

## 2020-09-14 DIAGNOSIS — F05 Delirium due to known physiological condition: Secondary | ICD-10-CM

## 2020-09-14 DIAGNOSIS — J439 Emphysema, unspecified: Secondary | ICD-10-CM | POA: Insufficient documentation

## 2020-09-14 DIAGNOSIS — C7989 Secondary malignant neoplasm of other specified sites: Secondary | ICD-10-CM | POA: Insufficient documentation

## 2020-09-14 LAB — GLUCOSE, CAPILLARY: Glucose-Capillary: 77 mg/dL (ref 70–99)

## 2020-09-14 MED ORDER — FLUDEOXYGLUCOSE F - 18 (FDG) INJECTION
5.5820 | Freq: Once | INTRAVENOUS | Status: AC | PRN
  Administered 2020-09-14: 5.582 via INTRAVENOUS

## 2020-09-14 NOTE — Telephone Encounter (Signed)
Received a call from patient's brother.  Brother reports increased weakness and visual changes.  He says that patient is walking outside to smoke and then attempts to open the door to come back inside but he will be 4 feet away from the door handle.  Discussed with Dr. Grayland Ormond.  Patient is pending PET scan this afternoon.  We will attempt to add on MRI of the brain when he comes in for PET.

## 2020-09-15 ENCOUNTER — Ambulatory Visit: Payer: Medicare HMO | Admitting: Oncology

## 2020-09-15 ENCOUNTER — Ambulatory Visit
Admission: RE | Admit: 2020-09-15 | Discharge: 2020-09-15 | Disposition: A | Discharge status: Home / Self Care | Payer: Medicare HMO | Source: Ambulatory Visit | Attending: Hospice and Palliative Medicine | Admitting: Hospice and Palliative Medicine

## 2020-09-15 ENCOUNTER — Telehealth: Payer: Self-pay | Admitting: *Deleted

## 2020-09-15 ENCOUNTER — Encounter: Payer: Medicare HMO | Admitting: Hospice and Palliative Medicine

## 2020-09-15 DIAGNOSIS — F05 Delirium due to known physiological condition: Secondary | ICD-10-CM | POA: Diagnosis present

## 2020-09-15 MED ORDER — GADOBUTROL 1 MMOL/ML IV SOLN
5.0000 mL | Freq: Once | INTRAVENOUS | Status: AC | PRN
  Administered 2020-09-15: 5 mL via INTRAVENOUS

## 2020-09-15 NOTE — Telephone Encounter (Signed)
Called report of MRI Brain  IMPRESSION: At least 15 enhancing parenchymal lesions are identified within the supratentorial and infratentorial brain, and findings are compatible with intracranial metastatic disease. The largest lesions are located within the left parietal lobe (1.7 x 1.7 cm), right parietooccipital lobes (2.5 x 2.2 cm), left temporal occipital lobes (2.4 x 2.3 cm), right cerebellum (measuring up to 2.6 x 2.2 cm) and within the inferior left cerebellum (1.6 x 1.7 cm). Edema surrounding many of the lesions. Most notably, there is moderate edema within the right parietooccipital lobes and right cerebellar hemisphere. Partial effacement of the fourth ventricle without evidence of hydrocephalus.  Dural thickening and enhancement overlying the bilateral cerebral hemispheres, measuring up to 4 mm likely reflecting dural-based metastatic disease.  Multifocal calvarial metastases.  Probable multifocal soft tissue tumor within the bilateral scalp, the largest focus measuring 4 mm in thickness overlying the right parietal calvarial metastasis.   Electronically Signed   By: Kellie Simmering DO   On: 09/15/2020 15:49

## 2020-09-16 ENCOUNTER — Encounter: Payer: Self-pay | Admitting: Oncology

## 2020-09-16 ENCOUNTER — Other Ambulatory Visit: Payer: Self-pay

## 2020-09-16 ENCOUNTER — Institutional Professional Consult (permissible substitution): Payer: Medicare HMO | Admitting: Radiation Oncology

## 2020-09-16 ENCOUNTER — Inpatient Hospital Stay: Payer: Medicare HMO

## 2020-09-16 ENCOUNTER — Inpatient Hospital Stay: Payer: Medicare HMO | Attending: Oncology | Admitting: Oncology

## 2020-09-16 ENCOUNTER — Inpatient Hospital Stay (HOSPITAL_BASED_OUTPATIENT_CLINIC_OR_DEPARTMENT_OTHER): Payer: Medicare HMO | Admitting: Hospice and Palliative Medicine

## 2020-09-16 VITALS — Wt 104.0 lb

## 2020-09-16 DIAGNOSIS — Z515 Encounter for palliative care: Secondary | ICD-10-CM | POA: Diagnosis not present

## 2020-09-16 DIAGNOSIS — I2510 Atherosclerotic heart disease of native coronary artery without angina pectoris: Secondary | ICD-10-CM | POA: Insufficient documentation

## 2020-09-16 DIAGNOSIS — Z8546 Personal history of malignant neoplasm of prostate: Secondary | ICD-10-CM | POA: Insufficient documentation

## 2020-09-16 DIAGNOSIS — C01 Malignant neoplasm of base of tongue: Secondary | ICD-10-CM | POA: Insufficient documentation

## 2020-09-16 DIAGNOSIS — Z85048 Personal history of other malignant neoplasm of rectum, rectosigmoid junction, and anus: Secondary | ICD-10-CM | POA: Insufficient documentation

## 2020-09-16 DIAGNOSIS — G893 Neoplasm related pain (acute) (chronic): Secondary | ICD-10-CM

## 2020-09-16 DIAGNOSIS — Z79899 Other long term (current) drug therapy: Secondary | ICD-10-CM | POA: Insufficient documentation

## 2020-09-16 DIAGNOSIS — I700 Atherosclerosis of aorta: Secondary | ICD-10-CM | POA: Insufficient documentation

## 2020-09-16 DIAGNOSIS — C7951 Secondary malignant neoplasm of bone: Secondary | ICD-10-CM | POA: Insufficient documentation

## 2020-09-16 DIAGNOSIS — K5900 Constipation, unspecified: Secondary | ICD-10-CM | POA: Insufficient documentation

## 2020-09-16 DIAGNOSIS — R4182 Altered mental status, unspecified: Secondary | ICD-10-CM | POA: Insufficient documentation

## 2020-09-16 DIAGNOSIS — J439 Emphysema, unspecified: Secondary | ICD-10-CM | POA: Insufficient documentation

## 2020-09-16 DIAGNOSIS — C760 Malignant neoplasm of head, face and neck: Secondary | ICD-10-CM

## 2020-09-16 DIAGNOSIS — M25552 Pain in left hip: Secondary | ICD-10-CM | POA: Insufficient documentation

## 2020-09-16 DIAGNOSIS — F17210 Nicotine dependence, cigarettes, uncomplicated: Secondary | ICD-10-CM | POA: Insufficient documentation

## 2020-09-16 DIAGNOSIS — F1010 Alcohol abuse, uncomplicated: Secondary | ICD-10-CM | POA: Insufficient documentation

## 2020-09-16 MED ORDER — D3-1000 25 MCG (1000 UT) PO TABS: 1000.0000 [IU] | ORAL_TABLET | Freq: Every day | ORAL | 3 refills | Status: AC

## 2020-09-16 MED ORDER — OXYCODONE HCL ER 10 MG PO T12A: 10.0000 mg | EXTENDED_RELEASE_TABLET | Freq: Two times a day (BID) | ORAL | 0 refills | Status: DC

## 2020-09-16 MED ORDER — TAMSULOSIN HCL 0.4 MG PO CAPS: 0.4000 mg | ORAL_CAPSULE | Freq: Every day | ORAL | 3 refills | Status: AC

## 2020-09-16 MED ORDER — DOCUSATE SODIUM 100 MG PO CAPS: 100.0000 mg | ORAL_CAPSULE | Freq: Every day | ORAL | 3 refills | Status: AC

## 2020-09-16 MED ORDER — ONDANSETRON HCL 8 MG PO TABS: 8.0000 mg | ORAL_TABLET | Freq: Three times a day (TID) | ORAL | 3 refills | Status: AC | PRN

## 2020-09-16 MED ORDER — GABAPENTIN 300 MG PO CAPS: 300.0000 mg | ORAL_CAPSULE | Freq: Three times a day (TID) | ORAL | 3 refills | Status: AC

## 2020-09-16 MED ORDER — OXYCODONE HCL ER 10 MG PO T12A
10.0000 mg | EXTENDED_RELEASE_TABLET | Freq: Two times a day (BID) | ORAL | 0 refills | Status: DC
Start: 2020-09-16 — End: 2020-09-28

## 2020-09-16 MED ORDER — DEXAMETHASONE 4 MG PO TABS: 4.0000 mg | ORAL_TABLET | Freq: Every day | ORAL | 0 refills | Status: AC

## 2020-09-16 MED ORDER — SENNA 8.6 MG PO TABS: 1.0000 | ORAL_TABLET | Freq: Every day | ORAL | 3 refills | Status: AC | PRN

## 2020-09-16 NOTE — Progress Notes (Signed)
Virtual Visit via Telephone Note  I connected with Zachary Gilmore on 09/16/20 at 10:30 AM EST by telephone and verified that I am speaking with the correct person using two identifiers.  Location: Patient: home Provider: clinic   I discussed the limitations, risks, security and privacy concerns of performing an evaluation and management service by telephone and the availability of in person appointments. I also discussed with the patient that there may be a patient responsible charge related to this service. The patient expressed understanding and agreed to proceed.   History of Present Illness: Zachary Gilmore is a 65 y.o. 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   PET scan on 05/05/2020 revealed marked reduction in size and activity of nodal mass in neck but new hypermetabolic lesion in the left iliac bone.  Patient underwent XRT to hip.  However, patient has continued to have severe pain.    PET scan on 09/14/2020 revealed widespread progression with osseous metastatic disease and soft tissue metastasis in the neck, chest, abdomen, and pelvis.  MRI of the brain on 09/15/2020 revealed multiple brain metastatic masses.  He was referred to palliative care to address goals and manage ongoing symptoms.   Observations/Objective: Patient was scheduled for clinic visit today with Dr. Grayland Gilmore and me to discuss results of imaging.  Family was sick and unable to bring him to the clinic.  Dr. Grayland Gilmore instead met virtually with patient and family.  Patient is being referred to Dr. Baruch Gilmore for consideration of whole brain radiation.  Patient is not felt to be a viable candidate for systemic treatment and hospice would be recommended following XRT.  I spoke with family by  phone.  They understand the significance of patient's prognosis.  They are trying to find placement for patient.  I recommended that we involve hospice at the first opportunity and allow social work to help guide [place] for LTC/end-of-life placement.  I reviewed medication list with family.  We will send refills for medications to new pharmacy.  Will discontinue Aricept and mirtazapine as they are unlikely to provide significant benefit.  Symptomatically, family stopped giving patient MS Contin due to pruritus.  Pain is subsequently increased.  Will rotate to OxyContin 10 mg every 12 hours.  Continue OxyIR as needed for breakthrough pain.  Patient endorses significant constipation, likely opioid induced.  Discussed bowel regimen in detail.  Assessment and Plan: Stage IV squamous cell of the head and neck -significant disease progression on imaging.  Patient is being referred to radiation oncology for consideration of whole brain radiation.  Hospice would be recommended following XRT.  Neoplasm related pain -rotate from MS Contin to OxyContin 10 mg every 12 hours.  Continue oxycodone IR 5 to 10 mg every 4 hours as needed for breakthrough pain  Opioid-induced constipation -Daily bowel regimen with senna +/-MiraLAX  ACP -patient is a DNR/DNI  Follow Up Instructions: Follow-up MyChart visit 1 to 2 weeks   I discussed the assessment and treatment plan with the patient. The patient was provided an opportunity to ask questions and all were answered. The patient agreed with the plan and demonstrated an understanding of the instructions.   The patient was advised to call back or seek an in-person evaluation if the symptoms worsen or if the condition fails  to improve as anticipated.  I provided 15 minutes of non-face-to-face time during this encounter.   Zachary Hong, NP

## 2020-09-16 NOTE — Telephone Encounter (Signed)
Noted. Patient scheduled to see Dr. Grayland Ormond and me on 09/16/20. Will also consult radiation oncology.

## 2020-09-16 NOTE — Addendum Note (Signed)
Addended by: Altha Harm R on: 09/16/2020 12:13 PM   Modules accepted: Orders

## 2020-09-16 NOTE — Progress Notes (Signed)
Sister in law states that patient is having left hip and leg pain. Morphine is causing him to itch. He is eating well per sister in law but is still losing weight. Was last weighed about a week ago at 104. Does not check weight regularly.

## 2020-09-17 ENCOUNTER — Telehealth: Payer: Self-pay | Admitting: *Deleted

## 2020-09-17 NOTE — Telephone Encounter (Signed)
Had to r/s Dr. Baruch Gouty appt. For Monday.   Family asked for Palliative appt. To be moved as well due to a long commute to cancer center.   Son was given both appointments and also told that Lorrin Jackson would call on Monday at 11:15 for her appointment.

## 2020-09-20 ENCOUNTER — Inpatient Hospital Stay: Payer: Medicare HMO | Admitting: Hospice and Palliative Medicine

## 2020-09-20 ENCOUNTER — Inpatient Hospital Stay: Payer: Medicare HMO

## 2020-09-20 ENCOUNTER — Ambulatory Visit: Payer: Medicare HMO | Admitting: Radiation Oncology

## 2020-09-20 NOTE — Progress Notes (Signed)
Nutrition Follow-up:  Re-consulted for weight loss.  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  Patient completed chemotherapy (01/02/2020) and radiation on 01/08/2020.  Patient has received palliative radiation to left iliac bone now with intracranial metastatic disease. Palliative Care is following.  Planning to meet with radiation this week for discussion on whole brain radiation.  Spoke with brother, Orpah Greek via phone.  Orpah Greek reports that patient has been living with him and his wife for the past 3 weeks.  Appetite has increased per Orpah Greek.  Reports that when food is put in front of patient or when brother sits down to eat with patient he will eat better.  Often says he is not hungry but offered will eat.  Brother reports last night for dinner at 2 large bowls of orzo pasta with cheese and vegetables. Breakfast this am was 2 eggs and bacon.  Lunch maybe sweet cakes.  Brother says that patient may drink 1-3 ensure original shakes per day.  Denies trouble swallowing.  Brother does report issues with constipation.  Had large bowel movement yesterday.  Brother is giving patient senokot and miralax as needed.     Medications: dexamethasone  Labs: reviewed  Anthropometrics:   Weight 104 lb on 1/6 decreased from 120 lb in July 2021  13% weight loss in the last 6 months, significant   NUTRITION DIAGNOSIS: Inadequate oral intake continues    INTERVENTION:  Discussed ways to add calories and protein in diet. Will mail handout to brother Encouraged 350 calorie shake or higher as often as tolerated Encouraged foods high in protein and will mail soft moist protein foods handout. Will also mail Recipe Booklet. Contact information provided    MONITORING, EVALUATION, GOAL: weight trends, intake   NEXT VISIT: Feb 7th phone f/with brother Beaulah Corin B. Zenia Resides, [place], [place] Registered Dietitian [phone] (mobile)

## 2020-09-22 ENCOUNTER — Ambulatory Visit
Admission: RE | Admit: 2020-09-22 | Discharge: 2020-09-22 | Disposition: A | Discharge status: Home / Self Care | Payer: Medicare HMO | Source: Ambulatory Visit | Attending: Radiation Oncology | Admitting: Radiation Oncology

## 2020-09-22 ENCOUNTER — Encounter: Payer: Self-pay | Admitting: Radiation Oncology

## 2020-09-22 ENCOUNTER — Inpatient Hospital Stay (HOSPITAL_BASED_OUTPATIENT_CLINIC_OR_DEPARTMENT_OTHER): Payer: Medicare HMO | Admitting: Hospice and Palliative Medicine

## 2020-09-22 VITALS — BP 95/62 | HR 70 | Temp 96.3°F | Resp 16

## 2020-09-22 DIAGNOSIS — G893 Neoplasm related pain (acute) (chronic): Secondary | ICD-10-CM | POA: Diagnosis not present

## 2020-09-22 DIAGNOSIS — C7931 Secondary malignant neoplasm of brain: Secondary | ICD-10-CM | POA: Diagnosis not present

## 2020-09-22 DIAGNOSIS — C01 Malignant neoplasm of base of tongue: Secondary | ICD-10-CM | POA: Diagnosis present

## 2020-09-22 DIAGNOSIS — C7951 Secondary malignant neoplasm of bone: Secondary | ICD-10-CM | POA: Diagnosis not present

## 2020-09-22 DIAGNOSIS — I2510 Atherosclerotic heart disease of native coronary artery without angina pectoris: Secondary | ICD-10-CM | POA: Diagnosis not present

## 2020-09-22 DIAGNOSIS — F17210 Nicotine dependence, cigarettes, uncomplicated: Secondary | ICD-10-CM | POA: Diagnosis not present

## 2020-09-22 DIAGNOSIS — R519 Headache, unspecified: Secondary | ICD-10-CM | POA: Insufficient documentation

## 2020-09-22 DIAGNOSIS — M25552 Pain in left hip: Secondary | ICD-10-CM | POA: Diagnosis not present

## 2020-09-22 DIAGNOSIS — J439 Emphysema, unspecified: Secondary | ICD-10-CM | POA: Diagnosis not present

## 2020-09-22 DIAGNOSIS — F1010 Alcohol abuse, uncomplicated: Secondary | ICD-10-CM | POA: Diagnosis not present

## 2020-09-22 DIAGNOSIS — C801 Malignant (primary) neoplasm, unspecified: Secondary | ICD-10-CM | POA: Insufficient documentation

## 2020-09-22 DIAGNOSIS — C760 Malignant neoplasm of head, face and neck: Secondary | ICD-10-CM

## 2020-09-22 DIAGNOSIS — K5900 Constipation, unspecified: Secondary | ICD-10-CM | POA: Diagnosis not present

## 2020-09-22 DIAGNOSIS — R4182 Altered mental status, unspecified: Secondary | ICD-10-CM | POA: Diagnosis not present

## 2020-09-22 DIAGNOSIS — Z8546 Personal history of malignant neoplasm of prostate: Secondary | ICD-10-CM | POA: Diagnosis not present

## 2020-09-22 DIAGNOSIS — Z515 Encounter for palliative care: Secondary | ICD-10-CM | POA: Diagnosis not present

## 2020-09-22 DIAGNOSIS — Z85048 Personal history of other malignant neoplasm of rectum, rectosigmoid junction, and anus: Secondary | ICD-10-CM | POA: Diagnosis not present

## 2020-09-22 DIAGNOSIS — I700 Atherosclerosis of aorta: Secondary | ICD-10-CM | POA: Diagnosis not present

## 2020-09-22 DIAGNOSIS — Z79899 Other long term (current) drug therapy: Secondary | ICD-10-CM | POA: Diagnosis not present

## 2020-09-22 NOTE — Progress Notes (Signed)
[facility]  Telephone:([phone] [phone]   Name: Zachary Gilmore Date: 09/22/2020 MRN: [id number]  DOB: August 08, 1956  Patient Care Team: Zachary American, FNP as PCP - General (Family Medicine) Zachary Huger, MD as Consulting Physician (Hematology and Oncology) Zachary Huger, MD as Consulting Physician (Oncology) Zachary Canterbury, MD as Referring Physician (Otolaryngology)    REASON FOR CONSULTATION: Zachary Gilmore is a 65 y.o. 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  PET scan on 05/05/2020 revealed marked reduction in size and activity of nodal mass in neck but new hypermetabolic lesion in the left iliac bone.  Patient underwent XRT to hip.  However, patient has continued to have severe pain and new onset confusion.  Patient had PET scan on 09/14/2020 followed by MRI of the brain on 09/15/2020 revealing significant interval disease progression involving new brain/osseous/soft tissue metastasis.  He was referred to palliative care to address goals and manage ongoing symptoms.  SOCIAL HISTORY:     reports that he has been smoking cigarettes. He has a 22.50 pack-year smoking history. He has never used smokeless tobacco. He reports current alcohol use. He reports current drug use. Drug: Marijuana.  Patient used to reside in an ALF but it was closed.  He was living with the previous owner of the ALF but is now living with his brother near [place], [place].   Patient is unmarried.  He has a son and [place], [place].  Patient also has a brother who is involved in his care.  ADVANCE DIRECTIVES:  Does not have  CODE STATUS: DNR/DNI (DNR form completed on 08/16/2020)  PAST MEDICAL HISTORY: Past Medical History:  Diagnosis Date  .  Alcohol abuse   . Colorectal cancer ([place])   . GERD (gastroesophageal reflux disease)   . Hepatitis   . History of substance abuse ([place])   . Neuropathy   . Osteoarthritis   . Prostate cancer ([place])   . Seizures ([place])    as a child  . Shoulder pain   . Tobacco use     PAST SURGICAL HISTORY:  Past Surgical History:  Procedure Laterality Date  . CARDIAC CATHETERIZATION N/A 10/03/2016   Procedure: Left Heart Cath and Coronary Angiography;  Surgeon: Zachary Skains, MD;  Location: [facility];  Service: Cardiovascular;  Laterality: N/A;  . COLON SURGERY    . colorectal cancer     "scraped off"   . PROSTATE BIOPSY    . SHOULDER ARTHROSCOPY WITH BICEPS TENDON REPAIR Right 10/03/2016   Procedure: SHOULDER ARTHROSCOPIC LIMITED DEBRIDEMENT  WITH BICEPS TENOLYSIS;  Surgeon: Zachary Mull, MD;  Location: [facility];  Service: Orthopedics;  Laterality: Right;  . Stomach ulcers      HEMATOLOGY/ONCOLOGY HISTORY:  Oncology History  Squamous cell carcinoma of head and neck ([place])  10/04/2019 Initial Diagnosis   Squamous cell carcinoma of head and neck ([place])   11/07/2019 Cancer Staging   Staging form: Cervical Lymph Nodes and Unknown Primary Tumors of the Head and Neck, AJCC 8th Edition - Clinical stage from 11/07/2019: Stage IVA (cT0, cN2a, cM0) - Signed by Zachary Huger, MD on 11/07/2019   11/20/2019 -  Chemotherapy   The patient had palonosetron (ALOXI) injection 0.25 mg, 0.25 mg, Intravenous,  Once, 7 of 7 cycles  Administration: 0.25 mg (11/20/2019), 0.25 mg (11/28/2019), 0.25 mg (12/05/2019), 0.25 mg (12/12/2019), 0.25 mg (12/19/2019), 0.25 mg (12/26/2019), 0.25 mg (01/02/2020) CISplatin (PLATINOL) 70 mg in sodium chloride 0.9 % 250 mL chemo infusion, 40 mg/m2 = 70 mg, Intravenous,  Once, 7 of 7 cycles Administration: 70 mg (11/20/2019), 70 mg (11/28/2019), 70 mg (12/05/2019), 70 mg (12/12/2019), 70 mg (12/19/2019), 70 mg (12/26/2019), 70 mg (01/02/2020) fosaprepitant (EMEND) 150 mg in sodium chloride  0.9 % 145 mL IVPB, 150 mg, Intravenous,  Once, 7 of 7 cycles Administration: 150 mg (11/20/2019), 150 mg (11/28/2019), 150 mg (12/05/2019), 150 mg (12/12/2019), 150 mg (12/19/2019), 150 mg (12/26/2019), 150 mg (01/02/2020)  for chemotherapy treatment.      ALLERGIES:  has No Known Allergies.  MEDICATIONS:  Current Outpatient Medications  Medication Sig Dispense Refill  . acetaminophen (TYLENOL) 500 MG tablet Take 500 mg by mouth every 6 (six) hours as needed for mild pain.  (Patient not taking: Reported on 09/22/2020)    . Cholecalciferol (D3-1000) 25 MCG (1000 UT) tablet Take 1 tablet (1,000 Units total) by mouth daily. 30 tablet 3  . dexamethasone (DECADRON) 4 MG tablet Take 1 tablet (4 mg total) by mouth daily. 30 tablet 0  . docusate sodium (COLACE) 100 MG capsule Take 1 capsule (100 mg total) by mouth at bedtime. 100 capsule 3  . gabapentin (NEURONTIN) 300 MG capsule Take 1 capsule (300 mg total) by mouth 3 (three) times daily. 90 capsule 3  . ondansetron (ZOFRAN) 8 MG tablet Take 1 tablet (8 mg total) by mouth every 8 (eight) hours as needed for nausea or vomiting. 45 tablet 3  . oxyCODONE (OXYCONTIN) 10 mg 12 hr tablet Take 1 tablet (10 mg total) by mouth every 12 (twelve) hours. (Patient not taking: Reported on 09/22/2020) 60 tablet 0  . Oxycodone HCl 10 MG TABS Take 0.5-1 tablets (5-10 mg total) by mouth every 4 (four) hours as needed. 90 tablet 0  . senna (SENOKOT) 8.6 MG TABS tablet Take 1 tablet (8.6 mg total) by mouth daily as needed for mild constipation. 120 tablet 3  . tamsulosin (FLOMAX) 0.4 MG CAPS capsule Take 1 capsule (0.4 mg total) by mouth daily after supper. 30 capsule 3   No current facility-administered medications for this visit.    VITAL SIGNS: There were no vitals taken for this visit. There were no vitals filed for this visit.  Estimated body mass index is 15.36 kg/m as calculated from the following:   Height as of 08/12/20: _0  (1.753 m).   Weight as of 09/16/20: 104  lb (47.2 kg).  LABS: CBC:    Component Value Date/Time   WBC 8.6 05/25/2020 1035   HGB 14.2 05/25/2020 1035   HGB 16.8 05/25/2016 1347   HCT 41.1 05/25/2020 1035   HCT 50.5 05/25/2016 1347   PLT 241 05/25/2020 1035   PLT 183 05/25/2016 1347   MCV 90.9 05/25/2020 1035   MCV 94 05/25/2016 1347   NEUTROABS 6.0 05/05/2020 1124   NEUTROABS 5.0 05/25/2016 1347   LYMPHSABS 0.9 05/05/2020 1124   LYMPHSABS 2.1 05/25/2016 1347   MONOABS 0.5 05/05/2020 1124   EOSABS 0.1 05/05/2020 1124   EOSABS 0.2 05/25/2016 1347   BASOSABS 0.0 05/05/2020 1124   BASOSABS 0.1 05/25/2016 1347   Comprehensive Metabolic Panel:    Component Value Date/Time   NA 136 05/05/2020 1124   K 4.7 05/05/2020 1124   CL 98 05/05/2020 1124   CO2 31 05/05/2020 1124   BUN  7 (L) 05/05/2020 1124   CREATININE 1.00 06/10/2020 1134   GLUCOSE 104 (H) 05/05/2020 1124   CALCIUM 9.1 05/05/2020 1124   AST 18 03/25/2020 0946   ALT 12 03/25/2020 0946   ALKPHOS 74 03/25/2020 0946   BILITOT 0.6 03/25/2020 0946   BILITOT 0.5 05/25/2016 1347   PROT 6.9 03/25/2020 0946   PROT 7.1 05/25/2016 1347   ALBUMIN 4.0 03/25/2020 0946   ALBUMIN 4.2 05/25/2016 1347    RADIOGRAPHIC STUDIES: MR Brain W Wo Contrast  Result Date: 09/15/2020 CLINICAL DATA:  Acute confusional state. Mental status change, unknown cause; confusion, vision changes. EXAM: MRI HEAD WITHOUT AND WITH CONTRAST TECHNIQUE: Multiplanar, multiecho pulse sequences of the brain and surrounding structures were obtained without and with intravenous contrast. CONTRAST:  42m GADAVIST GADOBUTROL 1 MMOL/ML IV SOLN COMPARISON:  Neck CT 06/10/2020. FINDINGS: Brain: Mild cerebral atrophy. There are numerous enhancing intracranial lesions (compatible with metastatic disease) as follows. 4 mm peripherally enhancing lesion within the paramedian high left frontal lobe (series 13, image 49). 1.7 x 1.7 cm peripherally enhancing lesion within the left parietal lobe with mild surrounding edema  (series 13, image 130). 4 mm peripherally enhancing lesion within the right parietal lobe (series 13, image 135). 4 mm peripherally enhancing lesion within the periventricular right frontal lobe (series 13, image 119). 2.5 x 2.2 cm peripherally enhancing lesion within the right parietooccipital lobes (series 13, image 102). Moderate surrounding edema. 2.4 x 2.3 cm peripherally enhancing lesion within the left temporal occipital lobes (series 13, image 82). Mild surrounding edema. 3 mm enhancing lesion within the periventricular left temporal lobe (series 13, image 83). 4 mm peripherally enhancing lesion within the anterior right frontal lobe (series 13, image 98). 3 mm peripherally enhancing lesion within the right frontal operculum (series 13, image 125). 2 mm enhancing lesion within the periventricular right temporal lobe (series 13, image 90). Possible 2 mm enhancing lesion within the anterior left temporal lobe (series 13, image 66). 2.6 x 2.2 cm peripherally enhancing lesion within the superomedial right cerebellar hemisphere. Moderate surrounding edema. Associated mass effect with partial effacement of the fourth ventricle. 2.5 x 2.2 cm peripherally enhancing lesion more laterally within the right cerebellar hemisphere (series 13, image 62). Moderate surrounding edema. 5 mm peripherally enhancing lesion within the posterior right cerebellar hemisphere (series 13, image 61). 1.6 x 1.7 cm peripherally enhancing lesion within the inferior left cerebellar hemisphere (series 13, image 51). Mild surrounding edema. SWI signal loss associated with some of the above lesions likely reflecting nonacute blood products. There is dural thickening overlying the bilateral cerebral hemispheres measuring up to 4 mm likely reflecting dural-based metastatic disease. Mild multifocal T2/FLAIR hyperintensity within the cerebral white matter is nonspecific, but compatible with chronic small vessel ischemic disease. There is no acute  infarct, extra-axial collection, supratentorial midline shift or evidence of hydrocephalus. Vascular: Expected proximal arterial flow voids. Skull and upper cervical spine: Multifocal T1 hypointense signal abnormality within the bilateral calvarium compatible with osseous metastatic disease. Sinuses/Orbits: Visualized orbits show no acute finding. Mild paranasal sinus mucosal thickening, most notably ethmoidal. Other: Redemonstrated cyst within the right cheek soft tissues, now measuring 2.8 x 2.4 cm in transaxial dimensions, favored benign (series 5, image 2). There is multifocal abnormal scalp enhancement likely reflecting soft tissue tumor, the largest focus measuring 4 mm in thickness overlying a right parietal calvarial metastasis. These results will be called to the ordering clinician or representative by the Radiologist Assistant, and communication documented in the PACS or CNord  Dashboard. IMPRESSION: At least 15 enhancing parenchymal lesions are identified within the supratentorial and infratentorial brain, and findings are compatible with intracranial metastatic disease. The largest lesions are located within the left parietal lobe (1.7 x 1.7 cm), right parietooccipital lobes (2.5 x 2.2 cm), left temporal occipital lobes (2.4 x 2.3 cm), right cerebellum (measuring up to 2.6 x 2.2 cm) and within the inferior left cerebellum (1.6 x 1.7 cm). Edema surrounding many of the lesions. Most notably, there is moderate edema within the right parietooccipital lobes and right cerebellar hemisphere. Partial effacement of the fourth ventricle without evidence of hydrocephalus. Dural thickening and enhancement overlying the bilateral cerebral hemispheres, measuring up to 4 mm likely reflecting dural-based metastatic disease. Multifocal calvarial metastases. Probable multifocal soft tissue tumor within the bilateral scalp, the largest focus measuring 4 mm in thickness overlying the right parietal calvarial metastasis.  Electronically Signed   By: Kellie Simmering DO   On: 09/15/2020 15:49   MR HIP LEFT W WO CONTRAST  Result Date: 08/25/2020 CLINICAL DATA:  Patient with a history of head and neck carcinoma. Left hip pain. No known injury. EXAM: MRI OF THE LEFT HIP WITHOUT AND WITH CONTRAST TECHNIQUE: Multiplanar, multisequence MR imaging was performed both before and after administration of intravenous contrast. CONTRAST:  5 mL GADAVIST IV SOLN COMPARISON:  CT lumbar spine 08/12/2020.  PET CT scan 05/05/2020. FINDINGS: Bones: A large metastatic deposit in the left ilium extends from the posterior aspect of the ileum approximately 10 cm anteriorly. The lesion fills the medullary space and measures up to 10 cm craniocaudal. Although partially imaged, there appears to be soft tissue tumor extension out of the superior aspect of the left ilium. This finding is seen in the coronal plane only. A large metastatic deposit is also seen in S1. A deposit measuring approximately 1.5 cm is seen in the left sacral ala adjacent to the left ilium. A 0.5 cm focus of signal abnormality and enhancement left femoral neck is worrisome for metastatic disease. There is also a punctate metastatic focus in the right parasymphyseal pubic bone. No fracture is identified. Articular cartilage and labrum Articular cartilage:  Mildly degenerated. Labrum: There is degenerative tearing of the anterior and superior left labrum. Joint or bursal effusion Joint effusion:  None. Bursae: Negative. Muscles and tendons Muscles and tendons: There is edema and enhancement in the left iliacus muscle and left gluteus medius a medially adjacent to the left iliac deposit which is likely to sympathetic change. Small amount of fluid is seen subjacent to the left iliacus. Other findings Miscellaneous:   There is some free pelvic fluid. IMPRESSION: Bony metastatic disease with the largest deposits in the left ilium and S1 vertebral body. Tumor extension superiorly out of the left  ilium is only seen in the coronal plane. Edema and enhancement in the left iliacus and gluteus medius muscles immediately adjacent to the patient's iliac deposit is likely sympathetic. Negative for fracture. Punctate metastatic deposit in the left femoral neck noted. Mild degenerative disease about the left hip with associated degenerative tearing the left anterior and superior labrum. Electronically Signed   By: Inge Rise M.D.   On: 08/25/2020 11:37   NM PET Image Restag (PS) Skull Base To Thigh  Result Date: 09/14/2020 CLINICAL DATA:  Subsequent treatment strategy for squamous cell carcinoma of head neck diagnosed 05/25/2020. Status post chemotherapy. EXAM: NUCLEAR MEDICINE PET SKULL BASE TO THIGH TECHNIQUE: 5.5 mCi F-18 FDG was injected intravenously. Full-ring PET imaging was performed from the  skull base to thigh after the radiotracer. CT data was obtained and used for attenuation correction and anatomic localization. Fasting blood glucose: 77 mg/dl COMPARISON:  Neck CT 06/10/2020.  PET 05/05/2020. FINDINGS: Mediastinal blood pool activity: SUV max 1.5 Liver activity: SUV max NA NECK: A focus of hypermetabolism either exophytic off or adjacent the right lobe of the thyroid measures a S.U.V. max of 8.1, including on 77/3. No other soft tissue hypermetabolism within the neck. Incidental CT findings: Presumed sebaceous cyst about the right face at 3.0 cm. No cervical adenopathy. Bilateral carotid atherosclerosis. CHEST: Hypermetabolism about the left infraspinatus musculature is due to trauma or motion after radiopharmaceutical. Clustered hypermetabolic nodularity within the left lower lobe, including at a S.U.V. max of 2.2 on 146/3. A hypermetabolic area of soft tissue thickening within the pericardium measures 1.0 cm and a S.U.V. max of 5.9 on 159/3. Left anterior chest wall subcutaneous nodule of 9 mm and a S.U.V. max of 5.6 on 156/3. Incidental CT findings: Centrilobular and paraseptal emphysema.  Aortic and coronary artery atherosclerosis. ABDOMEN/PELVIS: Right suprarenal hypermetabolism is likely adrenal in origin. Example at a S.U.V. max of 4.2 on approximately image 170/3. A nodule within the right ischiorectal fossa measures 9 mm and a S.U.V. max of 5.7 on 278/3. Probable muscular metastasis as evidenced by hypermetabolism about the proximal right thigh musculature including at a S.U.V. max of 2.9 on approximately image 272/3. Incidental CT findings: Abdominal aortic atherosclerosis. Radiation seeds in the prostate. SKELETON: Significantly progressive osseous metastasis. For example, a permeative left iliac mass measures a S.U.V. max of 14.2 on 225/3. Surrounding soft tissue component. Compare a S.U.V. max of 12.7 on the prior exam. New left greater than right sacral hypermetabolism at a S.U.V. max of 12.2. New L1 lesion at a S.U.V. max of 7.8. Incidental CT findings: None IMPRESSION: 1. Significant progression of the osseous metastasis. 2. Soft tissue metastasis within the neck, chest, abdomen and pelvis, somewhat unusual/atypical distribution, as detailed above. 3. Aortic atherosclerosis (ICD10-I70.0), coronary artery atherosclerosis and emphysema (ICD10-J43.9). 4. Clustered left lower lobe nodularity, suspicious for aspiration or infection in this patient with a history of head neck primary. Electronically Signed   By: Abigail Miyamoto M.D.   On: 09/14/2020 16:23    PERFORMANCE STATUS (ECOG) : 2 - Symptomatic, <50% confined to bed  Review of Systems Unless otherwise noted, a complete review of systems is negative.  Physical Exam General: Thin, frail-appearing Pulmonary: Unlabored Extremities: no edema, no joint deformities Skin: no rashes Neurological: Weakness but otherwise nonfocal  IMPRESSION: I met with patient and his brother today in the clinic.  I saw the patient after his visit with Dr. Baruch Gouty.  Patient is pending whole brain radiation in 10 fractions over 2 weeks.  Patient's  brother reports that pain has been relatively well managed with scheduled oxycodone every 4 hours.  He has given a couple of doses of the OxyContin but feels that it causes patient to be somewhat loopy.  He plans to continue oxycodone IR for now.  Patient continues to endorse constipation and we discussed liberalizing his bowel regimen.  Patient feels that fatigue and appetite are improved on dexamethasone.  We will continue that for now.  Note plan is to pursue hospice following completion of XRT.  Patient is living near [place], so I suspect referral will need to be made to [facility]. I did call [facility] today and confirmed that they do service that area when they have staffing. Will pursue referral process.  I signed a new DNR form for patient to take home today at brother's request.  PLAN: -Best supportive care -Referral to hospice -Continue OxyContin/oxycodone IR -DNR/DNI -Virtual visit 2 weeks  Case and plan discussed with Dr. Grayland Ormond  Patient expressed understanding and was in agreement with this plan. He also understands that He can call the clinic at any time with any questions, concerns, or complaints.    Time Total: 30 minutes  Visit consisted of counseling and education dealing with the complex and emotionally intense issues of symptom management and palliative care in the setting of serious and potentially life-threatening illness.Greater than 50%  of this time was spent counseling and coordinating care related to the above assessment and plan.  Signed by: Altha Harm, PhD, NP-C

## 2020-09-22 NOTE — Progress Notes (Signed)
Radiation Oncology Follow up Note  Name: Zachary Gilmore   Date:   09/22/2020 MRN:  [id number] DOB: [birth year]    This 65 y.o. male presents to the clinic today for widespread brain metastasis and patient with known stage IV head and neck cancer.  REFERRING PROVIDER: Carlos American, FNP  HPI: Patient is a 65 year old male.  Previously treated back 6 months prior for head and neck cancer locally advanced with concurrent chemoradiation therapy this presented as a right neck mass with unknown primary.  Patient also recently completed palliative radiation therapy for single metastatic site in his left iliac bone although pain is resolved somewhat it is still present.  Patient had recent repeat PET CT scan showing widespread metastatic disease.  MRI of his brain also showed at least fifteen enhancing parenchymal lesions consistent with widespread metastatic disease of the brain.  He is fairly asymptomatic from neurologic standpoint is having some headaches always been put on steroids.  He specifically denies change in visual fields any focal neurologic deficits.  COMPLICATIONS OF TREATMENT: none  FOLLOW UP COMPLIANCE: keeps appointments   PHYSICAL EXAM:  BP 95/62 (BP Location: Right Arm, Patient Position: Sitting)   Pulse 70   Temp (!) 96.3 F (35.7 C) (Tympanic)   Resp 16  Crude visual fields within normal range motor sensory and DTR levels are equal symmetric in the upper lower extremities.  Well-developed well-nourished patient in NAD. HEENT reveals PERLA, EOMI, discs not visualized.  Oral cavity is clear. No oral mucosal lesions are identified. Neck is clear without evidence of cervical or supraclavicular adenopathy. Lungs are clear to A&P. Cardiac examination is essentially unremarkable with regular rate and rhythm without murmur rub or thrill. Abdomen is benign with no organomegaly or masses noted. Motor sensory and DTR levels are equal and symmetric in the upper and lower extremities.  Cranial nerves II through XII are grossly intact. Proprioception is intact. No peripheral adenopathy or edema is identified. No motor or sensory levels are noted. Crude visual fields are within normal range.  RADIOLOGY RESULTS: PET CT scan and MRI of the brain reviewed compatible with above-stated findings  PLAN: At this time elected ahead with palliative radiation therapy to his whole brain.  I will plan on delivering thirty Gray in ten fractions.  Risks and benefits of treatment occluding hair loss cognitive decline fatigue skin reaction all were discussed in detail with the patient.  I have personally seven ordered CT simulation for early next week.  Patient will continue on steroids at this time.  I would like to take this opportunity to thank you for allowing me to participate in the care of your patient.Noreene Filbert, MD

## 2020-09-23 ENCOUNTER — Telehealth: Payer: Self-pay | Admitting: Hospice and Palliative Medicine

## 2020-09-23 NOTE — Telephone Encounter (Signed)
I spoke with [facility] regarding possible hospice coverage. They can follow patient at his brother's home in [facility] when he is finished with palliative XRT. It appears that patient will have XRT through early 10/15/20.

## 2020-09-23 NOTE — Addendum Note (Signed)
Addended by: Irean Hong on: 09/23/2020 09:33 AM   Modules accepted: Orders

## 2020-09-27 ENCOUNTER — Ambulatory Visit: Payer: Medicare HMO

## 2020-09-28 ENCOUNTER — Ambulatory Visit
Admission: RE | Admit: 2020-09-28 | Discharge: 2020-09-28 | Disposition: A | Discharge status: Home / Self Care | Payer: Medicare HMO | Source: Ambulatory Visit | Attending: Radiation Oncology | Admitting: Radiation Oncology

## 2020-09-28 ENCOUNTER — Telehealth: Payer: Self-pay | Admitting: Hospice and Palliative Medicine

## 2020-09-28 DIAGNOSIS — C01 Malignant neoplasm of base of tongue: Secondary | ICD-10-CM | POA: Insufficient documentation

## 2020-09-28 DIAGNOSIS — Z510 Encounter for antineoplastic radiation therapy: Secondary | ICD-10-CM | POA: Diagnosis not present

## 2020-09-28 MED ORDER — OXYCODONE HCL 10 MG PO TABS: 5.0000 mg | ORAL_TABLET | ORAL | 0 refills | Status: AC | PRN

## 2020-09-28 NOTE — Telephone Encounter (Signed)
Received a call from patient's sister-in-law.  Patient continues to have persistent pain.  She says that when she has given him OxyContin it causes worsening confusion.  She would prefer to stick with oxycodone IR and we discussed liberalizing frequency to every 3 hours if needed.  We also discussed constipation management.  She is currently giving him senna twice daily.  I encouraged her to increase dose of senna and add MiraLAX.  She is concerned that patient will not be able to complete XRT.  She understands that hospice is an option at any point.  We have also discussed hospice IPU if needed.  Discussed case with Dr. Baruch Gouty.

## 2020-09-29 DIAGNOSIS — Z510 Encounter for antineoplastic radiation therapy: Secondary | ICD-10-CM | POA: Diagnosis not present

## 2020-09-30 ENCOUNTER — Ambulatory Visit: Admission: RE | Admit: 2020-09-30 | Payer: Medicare HMO | Source: Ambulatory Visit

## 2020-09-30 DIAGNOSIS — Z510 Encounter for antineoplastic radiation therapy: Secondary | ICD-10-CM | POA: Diagnosis not present

## 2020-10-04 ENCOUNTER — Ambulatory Visit
Admission: RE | Admit: 2020-10-04 | Discharge: 2020-10-04 | Disposition: A | Discharge status: Home / Self Care | Payer: Medicare HMO | Source: Ambulatory Visit | Attending: Radiation Oncology | Admitting: Radiation Oncology

## 2020-10-04 ENCOUNTER — Inpatient Hospital Stay: Payer: Medicare HMO | Admitting: Hospice and Palliative Medicine

## 2020-10-04 ENCOUNTER — Other Ambulatory Visit: Payer: Self-pay | Admitting: Hospice and Palliative Medicine

## 2020-10-04 ENCOUNTER — Telehealth: Payer: Self-pay | Admitting: *Deleted

## 2020-10-04 DIAGNOSIS — Z510 Encounter for antineoplastic radiation therapy: Secondary | ICD-10-CM | POA: Diagnosis not present

## 2020-10-04 MED ORDER — LACTULOSE 10 GM/15ML PO SOLN
10.0000 g | Freq: Every day | ORAL | 0 refills | Status: AC | PRN
Start: 2020-10-04 — End: ?

## 2020-10-04 NOTE — Telephone Encounter (Signed)
I would be happy to see him when he comes in although we cannot administer an enema here if that is what she is hoping.

## 2020-10-04 NOTE — Telephone Encounter (Signed)
Patient's sister call to report that patient had not had a bowel movement in several days despite, daily stool softener, and miralax. She states that patient is complaining of abdominal pain and requesting an enema. He is coming in for radiation therapy today, she would like to know if someone could see him today to assess him.

## 2020-10-04 NOTE — Progress Notes (Signed)
Patient was an add-on to my clinic schedule today for evaluation and management of constipation.  However, patient left prior to being seen.  I called and spoke with patient's family.  They report the patient has had constipation over past several days despite taking 2 tablets senna twice daily and daily MiraLAX.  They are unsure when patient last had normal bowel movement it could be as long as a week.  They deny him having any nausea or vomiting, abdominal distention, or fever and chills.  Reportedly, patient is passing gas.  Will send Rx for lactulose 15 to 30 mL daily.  We also discussed option for enema use suppositories.  If that fails, could consider peripheral opioid receptor antagonist as likely constipation is secondary to oxycodone use.  We also discussed possible need for rectal exam to assess for impaction.  Patient can utilize ER if needed.  We will plan to see him in the clinic tomorrow when he returns for XRT.  Note that patient's external he continues to feel that XRT is not in patient's best interest and would prefer if patient discontinued and pursued hospice.  She would like for me to speak with patient to assess his goals when seen tomorrow.

## 2020-10-05 ENCOUNTER — Ambulatory Visit
Admission: RE | Admit: 2020-10-05 | Discharge: 2020-10-05 | Disposition: A | Discharge status: Home / Self Care | Payer: Medicare HMO | Source: Ambulatory Visit | Attending: Radiation Oncology | Admitting: Radiation Oncology

## 2020-10-05 ENCOUNTER — Inpatient Hospital Stay (HOSPITAL_BASED_OUTPATIENT_CLINIC_OR_DEPARTMENT_OTHER): Payer: Medicare HMO | Admitting: Hospice and Palliative Medicine

## 2020-10-05 DIAGNOSIS — C770 Secondary and unspecified malignant neoplasm of lymph nodes of head, face and neck: Secondary | ICD-10-CM

## 2020-10-05 DIAGNOSIS — Z515 Encounter for palliative care: Secondary | ICD-10-CM

## 2020-10-05 DIAGNOSIS — C01 Malignant neoplasm of base of tongue: Secondary | ICD-10-CM | POA: Diagnosis not present

## 2020-10-05 DIAGNOSIS — C801 Malignant (primary) neoplasm, unspecified: Secondary | ICD-10-CM

## 2020-10-05 DIAGNOSIS — K5900 Constipation, unspecified: Secondary | ICD-10-CM

## 2020-10-05 DIAGNOSIS — C760 Malignant neoplasm of head, face and neck: Secondary | ICD-10-CM

## 2020-10-05 DIAGNOSIS — G3184 Mild cognitive impairment, so stated: Secondary | ICD-10-CM

## 2020-10-05 DIAGNOSIS — Z510 Encounter for antineoplastic radiation therapy: Secondary | ICD-10-CM | POA: Diagnosis not present

## 2020-10-05 DIAGNOSIS — C7951 Secondary malignant neoplasm of bone: Secondary | ICD-10-CM | POA: Diagnosis not present

## 2020-10-05 DIAGNOSIS — C7931 Secondary malignant neoplasm of brain: Secondary | ICD-10-CM | POA: Diagnosis not present

## 2020-10-05 DIAGNOSIS — R531 Weakness: Secondary | ICD-10-CM
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
## 2020-10-06 ENCOUNTER — Inpatient Hospital Stay: Payer: Medicare HMO | Admitting: Hospice and Palliative Medicine

## 2020-10-06 ENCOUNTER — Ambulatory Visit: Payer: Medicare HMO

## 2020-10-07 ENCOUNTER — Ambulatory Visit: Payer: Medicare HMO

## 2020-10-08 ENCOUNTER — Ambulatory Visit: Payer: Medicare HMO

## 2020-10-11 ENCOUNTER — Ambulatory Visit: Payer: Medicare HMO

## 2020-10-12 ENCOUNTER — Ambulatory Visit: Payer: Medicare HMO

## 2020-10-12 ENCOUNTER — Telehealth: Payer: Self-pay | Admitting: *Deleted

## 2020-10-12 NOTE — Telephone Encounter (Signed)
I spoke with patient's hospice nurse.  Okay to resume OxyContin 10 mg every 12 hours.  Continue oxycodone IR 10 mg every 3 hours as needed for breakthrough pain.  Family has not consistently been giving gabapentin or dexamethasone.  Okay to resume both at previous dosing/frequency.  Note that hospice will not cover OxyContin long-term and will eventually need to rotate MS Contin when patient has used current supply.  Patient is reportedly rapidly declining.

## 2020-10-12 NOTE — Telephone Encounter (Signed)
Lorriane Shire called reporting that patient pain is not controlled and that he is moaning an graoning asking for something to be done for it. She reports that his Oxy ER was stopped sue to making him unsteady on his feet and that he is using his Oxy IR every 3-4 hours. She reports that he is bedridden at this time and is asking if his Oxy ER could be restarted twice a day. Please advise

## 2020-10-13 ENCOUNTER — Ambulatory Visit: Payer: Medicare HMO

## 2020-10-14 ENCOUNTER — Ambulatory Visit: Payer: Medicare HMO

## 2020-10-15 ENCOUNTER — Ambulatory Visit: Payer: Medicare HMO

## 2020-10-15 ENCOUNTER — Ambulatory Visit: Payer: Medicare HMO | Admitting: Oncology

## 2020-10-15 ENCOUNTER — Encounter: Payer: Medicare HMO | Admitting: Hospice and Palliative Medicine

## 2020-11-09 DEATH — deceased

## 2021-11-22 IMAGING — PT NM PET TUM IMG INITIAL (PI) SKULL BASE T - THIGH
1 of 10 series · 1 of 25 positions shown · non-contrast
Comparison: Multiple exams, including CT neck 09/11/2019

CLINICAL DATA: Initial treatment strategy for squamous cell
carcinoma in the right neck found on nodal biopsy.

EXAM:
NUCLEAR MEDICINE PET SKULL BASE TO THIGH
TECHNIQUE: 7.4 mCi F-18 FDG was injected intravenously. Full-ring PET imaging
was performed from the skull base to thigh after the radiotracer. CT
data was obtained and used for attenuation correction and anatomic
localization.
Fasting blood glucose: 92 mg/dl

[Series 3: ct wb 5.0 b30f · axial · 5.0mm · 0.98mm/px · 1 of 329 slices shown]
[im 329/329  brain]
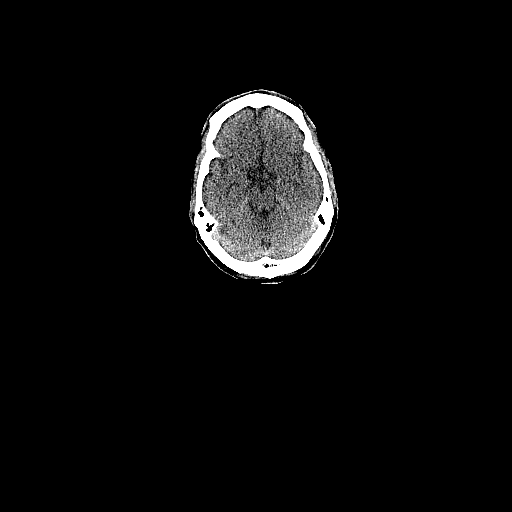

[1 of 25 positions shown; findings below may reference images not displayed]

FINDINGS: Mediastinal blood pool activity: SUV max

Liver activity: SUV max N/A

NECK: Right level IIa and level IIb conglomerate nodal mass
measuring 2.2 cm in short axis, maximum SUV 14.1, compatible with
known malignancy. No definite hypermetabolic mass along the
oropharynx, supraglottic, or glottic regions to indicate the site of
primary. There is some low-grade but symmetric activity along the
lingual tonsils. No additional hypermetabolic adenopathy is
identified.

Incidental CT findings: Subcutaneous cystic lesions along the right
face (image [DATE], measuring 3.1 by 2.3 cm) and along the left
posterior neck in the midline (1.6 by 1.2 cm).

CHEST: Sub solid 8 mm nodule posteriorly in the right upper lobe on
image 91/3 is unchanged from the CT neck, maximum SUV in this
vicinity 0.9.

Incidental CT findings: Coronary, aortic arch, and branch vessel
atherosclerotic vascular disease.

ABDOMEN/PELVIS: Accentuated activity throughout the stomach is
likely physiologic, maximum SUV 6.5.

Incidental CT findings: Aortoiliac atherosclerotic vascular disease.
Prostate fiducials noted.

SKELETON: No significant abnormal hypermetabolic activity in this
region.

Incidental CT findings: Chronic periosteal reaction or spurring
along the right femoral shaft on image 328/3, likely long-standing
but cause uncertain. There is some left lateral bony bridging
between 2 ribs. Cervical and lumbar spondylosis.
IMPRESSION: 1. Right level IIa and IIb conglomerate nodal mass 2.2 cm in short
axis, maximum SUV 14.1 compatible with malignancy. No definite
hypermetabolic primary lesion is identified along the oropharynx,
supraglottic, or glottic regions to indicate the site of primary.
2. The sub solid 8 mm nodule posteriorly in the right upper lobe is
unchanged, maximum SUV 0.9. This could be postinflammatory or due to
low-grade adenocarcinoma. This is highly unlikely to be the source
of the neck adenopathy. Consider surveillance CT of the chest in 12
months time.
3. Other imaging findings of potential clinical significance: Aortic
Atherosclerosis ([id number]). Coronary atherosclerosis. Prostate
fiducials noted. Spurring or chronic periosteal reaction along the
right femoral shaft, partially included on today's exam.
